# Patient Record
Sex: Male | Born: 1935 | Race: White | Hispanic: No | Smoking: Former smoker
Health system: Southern US, Community
[De-identification: ages and names within clinical notes are randomized; demographics above are authoritative.]

## PROBLEM LIST (undated history)

## (undated) DIAGNOSIS — I499 Cardiac arrhythmia, unspecified: Secondary | ICD-10-CM

## (undated) DIAGNOSIS — K219 Gastro-esophageal reflux disease without esophagitis: Secondary | ICD-10-CM

## (undated) DIAGNOSIS — G4730 Sleep apnea, unspecified: Secondary | ICD-10-CM

## (undated) DIAGNOSIS — Z8719 Personal history of other diseases of the digestive system: Secondary | ICD-10-CM

## (undated) DIAGNOSIS — M1990 Unspecified osteoarthritis, unspecified site: Secondary | ICD-10-CM

## (undated) DIAGNOSIS — E7800 Pure hypercholesterolemia, unspecified: Secondary | ICD-10-CM

## (undated) DIAGNOSIS — H9190 Unspecified hearing loss, unspecified ear: Secondary | ICD-10-CM

## (undated) DIAGNOSIS — Z9289 Personal history of other medical treatment: Secondary | ICD-10-CM

## (undated) DIAGNOSIS — I10 Essential (primary) hypertension: Secondary | ICD-10-CM

## (undated) DIAGNOSIS — Z950 Presence of cardiac pacemaker: Secondary | ICD-10-CM

## (undated) HISTORY — PX: PACEMAKER INSERTION: SHX728

## (undated) HISTORY — PX: INJECTION KNEE: SHX2446

## (undated) HISTORY — PX: SKIN GRAFT: SHX250

## (undated) HISTORY — PX: TONSILLECTOMY AND ADENOIDECTOMY: SUR1326

## (undated) HISTORY — PX: TRANSURETHRAL RESECTION OF PROSTATE: SHX73

## (undated) HISTORY — PX: OTHER SURGICAL HISTORY: SHX169

---

## 2009-02-24 ENCOUNTER — Ambulatory Visit: Payer: Self-pay | Admitting: Gastroenterology

## 2009-02-24 LAB — HM COLONOSCOPY

## 2011-05-16 ENCOUNTER — Ambulatory Visit: Payer: Self-pay | Admitting: Cardiology

## 2011-05-16 LAB — CBC WITH DIFFERENTIAL/PLATELET
Basophil %: 0.4 %
Eosinophil %: 4.7 %
HCT: 42.4 % (ref 40.0–52.0)
Lymphocyte #: 1.8 10*3/uL (ref 1.0–3.6)
MCH: 33.2 pg (ref 26.0–34.0)
MCHC: 34.7 g/dL (ref 32.0–36.0)
Monocyte #: 0.7 x10 3/mm (ref 0.2–1.0)
Neutrophil #: 3.8 10*3/uL (ref 1.4–6.5)
Neutrophil %: 57.8 %
RBC: 4.43 10*6/uL (ref 4.40–5.90)
WBC: 6.5 10*3/uL (ref 3.8–10.6)

## 2011-05-16 LAB — PROTIME-INR: Prothrombin Time: 12.7 secs (ref 11.5–14.7)

## 2011-05-16 LAB — BASIC METABOLIC PANEL
Anion Gap: 9 (ref 7–16)
BUN: 18 mg/dL (ref 7–18)
Calcium, Total: 8.8 mg/dL (ref 8.5–10.1)
Chloride: 107 mmol/L (ref 98–107)
EGFR (African American): >60
EGFR (Non-African Amer.): >60
Glucose: 100 mg/dL — ABNORMAL HIGH (ref 65–99)
Osmolality: 289 (ref 275–301)
Potassium: 4 mmol/L (ref 3.5–5.1)

## 2011-05-19 ENCOUNTER — Ambulatory Visit: Payer: Self-pay | Admitting: Cardiology

## 2012-01-20 ENCOUNTER — Ambulatory Visit: Payer: Self-pay | Admitting: Family Medicine

## 2013-11-11 DIAGNOSIS — I495 Sick sinus syndrome: Secondary | ICD-10-CM | POA: Insufficient documentation

## 2014-02-03 DIAGNOSIS — L820 Inflamed seborrheic keratosis: Secondary | ICD-10-CM | POA: Diagnosis not present

## 2014-02-03 DIAGNOSIS — L814 Other melanin hyperpigmentation: Secondary | ICD-10-CM | POA: Diagnosis not present

## 2014-02-03 DIAGNOSIS — L853 Xerosis cutis: Secondary | ICD-10-CM | POA: Diagnosis not present

## 2014-02-03 DIAGNOSIS — L905 Scar conditions and fibrosis of skin: Secondary | ICD-10-CM | POA: Diagnosis not present

## 2014-02-03 DIAGNOSIS — D1800 Hemangioma unspecified site: Secondary | ICD-10-CM | POA: Diagnosis not present

## 2014-02-03 DIAGNOSIS — L578 Other skin changes due to chronic exposure to nonionizing radiation: Secondary | ICD-10-CM | POA: Diagnosis not present

## 2014-02-03 DIAGNOSIS — D2330 Other benign neoplasm of skin of unspecified part of face: Secondary | ICD-10-CM | POA: Diagnosis not present

## 2014-02-03 DIAGNOSIS — Z1283 Encounter for screening for malignant neoplasm of skin: Secondary | ICD-10-CM | POA: Diagnosis not present

## 2014-02-03 DIAGNOSIS — L821 Other seborrheic keratosis: Secondary | ICD-10-CM | POA: Diagnosis not present

## 2014-02-03 DIAGNOSIS — L570 Actinic keratosis: Secondary | ICD-10-CM | POA: Diagnosis not present

## 2014-02-03 DIAGNOSIS — D485 Neoplasm of uncertain behavior of skin: Secondary | ICD-10-CM | POA: Diagnosis not present

## 2014-02-03 DIAGNOSIS — I788 Other diseases of capillaries: Secondary | ICD-10-CM | POA: Diagnosis not present

## 2014-04-10 DIAGNOSIS — M170 Bilateral primary osteoarthritis of knee: Secondary | ICD-10-CM | POA: Diagnosis not present

## 2014-05-04 NOTE — Op Note (Signed)
PATIENT NAME:  Wesley Small, WIENEKE MR#:  [id number] DATE OF BIRTH:  07/11/1935  DATE OF PROCEDURE:  05/19/2011  PREPROCEDURAL DIAGNOSIS: Type II second degree AV block.  PROCEDURE: Dual chamber pacemaker implantation.  POSTPROCEDURAL DIAGNOSIS: AV sequential pacing.  SURGEON: Isaias Cowman, MD  INDICATION: Patient is a 79 year old gentleman with symptomatic bradycardia, found to have type II second degree AV block at high risk for progression to complete heart block.   DESCRIPTION OF PROCEDURE: The patient was brought to the Operating Room in a fasting state. The left pectoral region was prepped and draped in usual sterile manner. Anesthesia was attained, 1% Xylocaine locally. A 6 cm incision was performed over the left pectoral region. The pacemaker pocket was generated by electrocautery and blunt dissection. Access was obtained to left subclavian vein by fine needle aspiration. Ventricular and atrial leads were positioned, right ventricular apex and right atrial appendage under fluoroscopic guidance. After proper thresholds were attained the leads wee sutured in place. The pacemaker pocket was irrigated with gentamicin solution. The leads were connected to a dual chamber rate responsive pacemaker generator (Medtronic Adapta [id number]) and positioned into the pocket. The pocket was closed with 2-0 and 4-0 Vicryl, respectively. Steri-Strips and pressure dressing were applied.    ____________________________ Isaias Cowman, MD ap:cms D: 05/19/2011 12:17:08 ET T: 05/19/2011 12:33:44 ET JOB#: [id number]  cc: Isaias Cowman, MD, <Dictator> Isaias Cowman MD ELECTRONICALLY SIGNED 06/23/2011 17:39

## 2014-05-05 DIAGNOSIS — F5109 Other insomnia not due to a substance or known physiological condition: Secondary | ICD-10-CM | POA: Diagnosis not present

## 2014-05-05 DIAGNOSIS — I10 Essential (primary) hypertension: Secondary | ICD-10-CM | POA: Diagnosis not present

## 2014-05-05 DIAGNOSIS — E780 Pure hypercholesterolemia: Secondary | ICD-10-CM | POA: Diagnosis not present

## 2014-05-05 DIAGNOSIS — Z23 Encounter for immunization: Secondary | ICD-10-CM | POA: Diagnosis not present

## 2014-05-05 DIAGNOSIS — K219 Gastro-esophageal reflux disease without esophagitis: Secondary | ICD-10-CM | POA: Diagnosis not present

## 2014-05-05 DIAGNOSIS — R5381 Other malaise: Secondary | ICD-10-CM | POA: Diagnosis not present

## 2014-05-06 DIAGNOSIS — N5201 Erectile dysfunction due to arterial insufficiency: Secondary | ICD-10-CM | POA: Diagnosis not present

## 2014-05-06 DIAGNOSIS — D400 Neoplasm of uncertain behavior of prostate: Secondary | ICD-10-CM | POA: Diagnosis not present

## 2014-05-06 DIAGNOSIS — E780 Pure hypercholesterolemia: Secondary | ICD-10-CM | POA: Diagnosis not present

## 2014-05-06 DIAGNOSIS — Z125 Encounter for screening for malignant neoplasm of prostate: Secondary | ICD-10-CM | POA: Diagnosis not present

## 2014-05-06 DIAGNOSIS — E291 Testicular hypofunction: Secondary | ICD-10-CM | POA: Diagnosis not present

## 2014-05-06 DIAGNOSIS — N401 Enlarged prostate with lower urinary tract symptoms: Secondary | ICD-10-CM | POA: Diagnosis not present

## 2014-05-06 DIAGNOSIS — R5383 Other fatigue: Secondary | ICD-10-CM | POA: Diagnosis not present

## 2014-05-06 LAB — CBC AND DIFFERENTIAL
HEMATOCRIT: 46 % (ref 41–53)
HEMOGLOBIN: 15.7 g/dL (ref 13.5–17.5)
Neutrophils Absolute: 4 /uL
PLATELETS: 202 10*3/uL (ref 150–399)
WBC: 7.6 10*3/mL

## 2014-05-06 LAB — BASIC METABOLIC PANEL
BUN: 16 mg/dL (ref 4–21)
Creatinine: 1.1 mg/dL (ref 0.6–1.3)
Glucose: 92 mg/dL
Potassium: 4.8 mmol/L (ref 3.4–5.3)
Sodium: 143 mmol/L (ref 137–147)

## 2014-05-06 LAB — HEPATIC FUNCTION PANEL
ALT: 31 U/L (ref 10–40)
AST: 23 U/L (ref 14–40)
Alkaline Phosphatase: 61 U/L (ref 25–125)

## 2014-05-06 LAB — LIPID PANEL
Cholesterol: 183 mg/dL (ref 0–200)
HDL: 52 mg/dL (ref 35–70)
LDL CALC: 80 mg/dL
LDL/HDL RATIO: 1.5
TRIGLYCERIDES: 257 mg/dL — AB (ref 40–160)

## 2014-05-06 LAB — TSH: TSH: 0.8 u[IU]/mL (ref 0.41–5.90)

## 2014-10-29 DIAGNOSIS — I10 Essential (primary) hypertension: Secondary | ICD-10-CM | POA: Insufficient documentation

## 2014-10-29 DIAGNOSIS — F4321 Adjustment disorder with depressed mood: Secondary | ICD-10-CM | POA: Insufficient documentation

## 2014-10-29 DIAGNOSIS — M1990 Unspecified osteoarthritis, unspecified site: Secondary | ICD-10-CM | POA: Insufficient documentation

## 2014-10-29 DIAGNOSIS — G4700 Insomnia, unspecified: Secondary | ICD-10-CM | POA: Insufficient documentation

## 2014-10-29 DIAGNOSIS — Z950 Presence of cardiac pacemaker: Secondary | ICD-10-CM | POA: Insufficient documentation

## 2014-10-29 DIAGNOSIS — E7800 Pure hypercholesterolemia, unspecified: Secondary | ICD-10-CM | POA: Insufficient documentation

## 2014-11-01 LAB — FECAL OCCULT BLOOD, GUAIAC: FECAL OCCULT BLD: NEGATIVE

## 2014-11-10 ENCOUNTER — Ambulatory Visit (INDEPENDENT_AMBULATORY_CARE_PROVIDER_SITE_OTHER): Payer: Medicare Other | Admitting: Family Medicine

## 2014-11-10 ENCOUNTER — Encounter: Payer: Self-pay | Admitting: Family Medicine

## 2014-11-10 VITALS — BP 148/92 | HR 72 | Temp 98.3°F | Resp 12 | Wt 169.0 lb

## 2014-11-10 DIAGNOSIS — E7800 Pure hypercholesterolemia, unspecified: Secondary | ICD-10-CM | POA: Diagnosis not present

## 2014-11-10 DIAGNOSIS — I10 Essential (primary) hypertension: Secondary | ICD-10-CM

## 2014-11-10 DIAGNOSIS — Z23 Encounter for immunization: Secondary | ICD-10-CM | POA: Diagnosis not present

## 2014-11-10 DIAGNOSIS — G4700 Insomnia, unspecified: Secondary | ICD-10-CM | POA: Diagnosis not present

## 2014-11-10 DIAGNOSIS — M1990 Unspecified osteoarthritis, unspecified site: Secondary | ICD-10-CM | POA: Diagnosis not present

## 2014-11-10 MED ORDER — TRAMADOL HCL 50 MG PO TABS
50.0000 mg | ORAL_TABLET | Freq: Two times a day (BID) | ORAL | Status: DC
Start: 2014-11-10 — End: 2015-06-09

## 2014-11-10 MED ORDER — ZOLPIDEM TARTRATE 10 MG PO TABS: 10.0000 mg | ORAL_TABLET | Freq: Every day | ORAL | Status: DC

## 2014-11-10 MED ORDER — LOSARTAN POTASSIUM 100 MG PO TABS: 100.0000 mg | ORAL_TABLET | Freq: Every day | ORAL | Status: DC

## 2014-11-10 NOTE — Progress Notes (Signed)
Patient ID: Wesley Small, male   DOB: Jan 23, 1935, 79 y.o.   MRN: [id number]    Subjective:  HPI  Hypertension follow up: Patient is here for 6 months follow up. He has been checking his B/P and lately it has been running higher around 155/90s, he has a follow up appointment with cardiologist this week. BP Readings from Last 3 Encounters:  11/10/14 148/92  05/05/14 146/86   Hyperlipidemia follow up:  Lab Results  Component Value Date   CHOL 183 05/06/2014   HDL 52 05/06/2014   LDLCALC 80 05/06/2014   TRIG 257* 05/06/2014   Insomnia follow up: Patient takes Zolpidem every night. He has tried a different medication that made him very dizzy-he is not sure if the name.   Prior to Admission medications   Medication Sig Start Date End Date Taking? Authorizing Provider  ascorbic acid (VITAMIN C) 500 MG tablet  02/15/09  Yes Historical Provider, MD  aspirin 81 MG tablet  12/24/07  Yes Historical Provider, MD  atorvastatin (LIPITOR) 10 MG tablet Take by mouth. 11/12/13  Yes Historical Provider, MD  cholecalciferol (VITAMIN D) 1000 UNITS tablet VITAMIN D, 1000UNIT (Oral Tablet)  1 Every Day for 0 days  Quantity: 0.00;  Refills: 0   Ordered :22-Sep-2009  Althea Charon ;  Started 15-Feb-2009 Active 02/15/09  Yes Historical Provider, MD  Coenzyme Q10 200 MG capsule Take by mouth.   Yes Historical Provider, MD  losartan (COZAAR) 50 MG tablet Take by mouth.   Yes Historical Provider, MD  Magnesium Oxide 400 MG CAPS Take by mouth. 02/15/09  Yes Historical Provider, MD  metoprolol succinate (TOPROL-XL) 25 MG 24 hr tablet Take by mouth. 04/19/11  Yes Historical Provider, MD  Multiple Vitamin tablet Take by mouth.   Yes Historical Provider, MD  Omega-3 Fatty Acids (SALMON OIL-1000) 200 MG CAPS Take by mouth. 04/19/11  Yes Historical Provider, MD  omeprazole (PRILOSEC) 20 MG capsule PRILOSEC, 20MG  (Oral Capsule Delayed Release)  1 q d for 0 days  Quantity: 0.00;  Refills: 0   Ordered  :22-Sep-2009  Althea Charon ;  Started 24-Dec-2007 Active 12/24/07  Yes Historical Provider, MD  Testosterone (ANDROGEL PUMP) 20.25 MG/ACT (1.62%) GEL Place onto the skin.   Yes Historical Provider, MD  traMADol (ULTRAM) 50 MG tablet Take by mouth. 02/27/14  Yes Historical Provider, MD  zolpidem (AMBIEN) 10 MG tablet Take by mouth. 05/05/14  Yes Historical Provider, MD    Patient Active Problem List   Diagnosis Date Noted  . Adjustment disorder with depressed mood 10/29/2014  . Arthritis 10/29/2014  . Insomnia, persistent 10/29/2014  . Hypercholesterolemia 10/29/2014  . BP (high blood pressure) 10/29/2014  . Artificial cardiac pacemaker 10/29/2014  . 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 Acid reflux 05/22/2009  . Essential (primary) hypertension 01/13/2009    No past medical history on file.  Social History   Social History  . Marital Status: Widowed    Spouse Name: N/A  . Number of Children: N/A  . Years of Education: N/A   Occupational History  . Not on file.   Social History Main Topics  . Smoking status: Never Smoker   . Smokeless tobacco: Never Used  . Alcohol Use: Yes  . Drug Use: No  . Sexual Activity: Not on file   Other Topics Concern  . Not on file   Social History Narrative    Allergies  Allergen Reactions  . Penicillins     Review of Systems  Constitutional: Negative.  Respiratory: Negative.   Cardiovascular: Negative.   Gastrointestinal: Negative.   Musculoskeletal: Positive for back pain and joint pain.  Psychiatric/Behavioral: The patient has insomnia (better on medication).     Immunization History  Administered Date(s) Administered  . Pneumococcal Conjugate-13 05/05/2014  . Tdap 10/12/2010   Objective:  BP 148/92 mmHg  Pulse 72  Temp(Src) 98.3 F (36.8 C)  Resp 12  Wt 169 lb (76.658 kg)  Physical Exam  Constitutional: He is oriented to person, place, and time and well-developed, well-nourished, and in no distress.  HENT:   Head: Normocephalic and atraumatic.  Right Ear: External ear normal.  Left Ear: External ear normal.  Nose: Nose normal.  Eyes: Conjunctivae are normal. Pupils are equal, round, and reactive to light.  Neck: Normal range of motion. Neck supple.  Cardiovascular: Normal rate, regular rhythm, normal heart sounds and intact distal pulses.   No murmur heard. Pulmonary/Chest: Effort normal and breath sounds normal. No respiratory distress. He has no wheezes.  Musculoskeletal: Normal range of motion. He exhibits no edema.  Neurological: He is alert and oriented to person, place, and time. Gait normal.  Psychiatric: Mood, memory, affect and judgment normal.    Lab Results  Component Value Date   WBC 7.6 05/06/2014   HGB 15.7 05/06/2014   HCT 46 05/06/2014   PLT 202 05/06/2014   GLUCOSE 100* 05/16/2011   CHOL 183 05/06/2014   TRIG 257* 05/06/2014   HDL 52 05/06/2014   LDLCALC 80 05/06/2014   TSH 0.80 05/06/2014   INR 0.9 05/16/2011    CMP     Component Value Date/Time   NA 143 05/06/2014   NA 144 05/16/2011 1113   K 4.8 05/06/2014   K 4.0 05/16/2011 1113   CL 107 05/16/2011 1113   CO2 28 05/16/2011 1113   GLUCOSE 100* 05/16/2011 1113   BUN 16 05/06/2014   BUN 18 05/16/2011 1113   CREATININE 1.1 05/06/2014   CREATININE 0.88 05/16/2011 1113   CALCIUM 8.8 05/16/2011 1113   AST 23 05/06/2014   ALT 31 05/06/2014   ALKPHOS 61 05/06/2014   GFRNONAA >60 05/16/2011 1113   GFRAA >60 05/16/2011 1113    Assessment and Plan :  1. Essential (primary) hypertension Elevated. Will increase Losartan to 100 mg at this time. May need to increase Metoprolol dose in the future. Re check on the next visit.  2. Insomnia, persistent Stable on medication. Patient says he cannot sleep without the Ambien. He denies any side effects at all.  3. Hypercholesterolemia Stable. Last check April 2016  4. Need for influenza vaccination  - Flu vaccine HIGH DOSE PF (Fluzone High dose)  5.  Arthritis    Patient was seen and examined by Dr. Eulas Post and note was scribed by Theressa Millard, RMA.   Miguel Aschoff MD [facility] 11/10/2014 10:05 AM

## 2014-11-11 DIAGNOSIS — D400 Neoplasm of uncertain behavior of prostate: Secondary | ICD-10-CM | POA: Diagnosis not present

## 2014-11-11 DIAGNOSIS — E291 Testicular hypofunction: Secondary | ICD-10-CM | POA: Diagnosis not present

## 2014-11-11 DIAGNOSIS — Z79899 Other long term (current) drug therapy: Secondary | ICD-10-CM | POA: Diagnosis not present

## 2014-11-11 DIAGNOSIS — N5201 Erectile dysfunction due to arterial insufficiency: Secondary | ICD-10-CM | POA: Diagnosis not present

## 2014-11-12 DIAGNOSIS — H2513 Age-related nuclear cataract, bilateral: Secondary | ICD-10-CM | POA: Diagnosis not present

## 2014-11-13 DIAGNOSIS — E782 Mixed hyperlipidemia: Secondary | ICD-10-CM | POA: Diagnosis not present

## 2014-11-13 DIAGNOSIS — I10 Essential (primary) hypertension: Secondary | ICD-10-CM | POA: Diagnosis not present

## 2014-11-13 DIAGNOSIS — I495 Sick sinus syndrome: Secondary | ICD-10-CM | POA: Diagnosis not present

## 2014-11-21 DIAGNOSIS — E291 Testicular hypofunction: Secondary | ICD-10-CM | POA: Diagnosis not present

## 2015-01-14 ENCOUNTER — Ambulatory Visit (INDEPENDENT_AMBULATORY_CARE_PROVIDER_SITE_OTHER): Payer: Medicare Other | Admitting: Family Medicine

## 2015-01-14 ENCOUNTER — Encounter: Payer: Self-pay | Admitting: Family Medicine

## 2015-01-14 VITALS — BP 148/82 | HR 70 | Temp 98.1°F | Resp 16 | Wt 171.0 lb

## 2015-01-14 DIAGNOSIS — E7800 Pure hypercholesterolemia, unspecified: Secondary | ICD-10-CM

## 2015-01-14 DIAGNOSIS — I10 Essential (primary) hypertension: Secondary | ICD-10-CM | POA: Diagnosis not present

## 2015-01-14 NOTE — Progress Notes (Signed)
Patient ID: Wesley Small, male   DOB: October 06, 1935, 80 y.o.   MRN: [id number]    Subjective:  HPI  Hypertension, follow-up:  BP Readings from Last 3 Encounters:  01/14/15 148/82  11/10/14 148/92  05/05/14 146/86    He was last seen for hypertension 3 months ago.  BP at that visit was 148/92. Management since that visit includes increase losartan to 100mg  daily. Noted in last OV note that we may need to increase Metoprolol today. He reports good compliance with treatment. He is not having side effects.  Outside blood pressures are 150's/80's. He is experiencing none.  Patient denies chest pain, chest pressure/discomfort and dyspnea.     Wt Readings from Last 3 Encounters:  01/14/15 171 lb (77.565 kg)  11/10/14 169 lb (76.658 kg)  05/05/14 166 lb (75.297 kg)    ------------------------------------------------------------------------ He reports that for about the last month he has had a "clicking noise" in his neck. He says normally it is when he turns his head to the left. Other people have also heard it and have noticed it as well. He denies any pain or history of neck surgeries or past injuries.   He also has had sinus symptoms started about 3 weeks ago. He had Headaches, bloody/cloudy discharge and jaw/tooth pain and post nasal drainage and a sore throat in the mornings. He thinks he is getting better from this, but wanted to bring it up because he is still having post nasal drainage.   Prior to Admission medications   Medication Sig Start Date End Date Taking? Authorizing Provider  ascorbic acid (VITAMIN C) 500 MG tablet  02/15/09  Yes Historical Provider, MD  aspirin 81 MG tablet  12/24/07  Yes Historical Provider, MD  atorvastatin (LIPITOR) 10 MG tablet Take by mouth. 11/12/13  Yes Historical Provider, MD  cholecalciferol (VITAMIN D) 1000 UNITS tablet VITAMIN D, 1000UNIT (Oral Tablet)  1 Every Day for 0 days  Quantity: 0.00;  Refills: 0   Ordered :22-Sep-2009  Althea Charon ;  Started 15-Feb-2009 Active 02/15/09  Yes Historical Provider, MD  Coenzyme Q10 200 MG capsule Take by mouth.   Yes Historical Provider, MD  losartan (COZAAR) 100 MG tablet Take 1 tablet (100 mg total) by mouth daily. 11/10/14  Yes Richard Maceo Pro., MD  Magnesium Oxide 400 MG CAPS Take by mouth. 02/15/09  Yes Historical Provider, MD  metoprolol succinate (TOPROL-XL) 25 MG 24 hr tablet Take by mouth. 04/19/11  Yes Historical Provider, MD  Multiple Vitamin tablet Take by mouth.   Yes Historical Provider, MD  Omega-3 Fatty Acids (SALMON OIL-1000) 200 MG CAPS Take by mouth. 04/19/11  Yes Historical Provider, MD  omeprazole (PRILOSEC) 20 MG capsule PRILOSEC, 20MG  (Oral Capsule Delayed Release)  1 q d for 0 days  Quantity: 0.00;  Refills: 0   Ordered :22-Sep-2009  Althea Charon ;  Started 24-Dec-2007 Active 12/24/07  Yes Historical Provider, MD  Testosterone (ANDROGEL PUMP) 20.25 MG/ACT (1.62%) GEL Place onto the skin.   Yes Historical Provider, MD  traMADol (ULTRAM) 50 MG tablet Take 1 tablet (50 mg total) by mouth 2 (two) times daily. 11/10/14  Yes Richard Maceo Pro., MD  zolpidem (AMBIEN) 10 MG tablet Take 1 tablet (10 mg total) by mouth at bedtime. 11/10/14  Yes Richard Maceo Pro., MD    Patient Active Problem List   Diagnosis Date Noted  . Adjustment disorder with depressed mood 10/29/2014  . Arthritis 10/29/2014  . Insomnia, persistent 10/29/2014  .  Hypercholesterolemia 10/29/2014  . BP (high blood pressure) 10/29/2014  . Artificial cardiac pacemaker 10/29/2014  . Sick sinus syndrome ([place]) 11/11/2013  . 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 Acid reflux 05/22/2009  . Essential (primary) hypertension 01/13/2009    History reviewed. No pertinent past medical history.  Social History   Social History  . Marital Status: Widowed    Spouse Name: N/A  . Number of Children: N/A  . Years of Education: N/A   Occupational History  . Not on file.   Social History  Main Topics  . Smoking status: Never Smoker   . Smokeless tobacco: Never Used  . Alcohol Use: Yes  . Drug Use: No  . Sexual Activity: Not on file   Other Topics Concern  . Not on file   Social History Narrative    Allergies  Allergen Reactions  . Penicillins     Review of Systems  Constitutional: Negative.   HENT: Positive for congestion.   Eyes: Negative.   Respiratory: Negative.   Cardiovascular: Negative.   Gastrointestinal: Negative.   Genitourinary: Negative.   Musculoskeletal: Negative.   Skin: Negative.   Neurological: Negative.   Endo/Heme/Allergies: Negative.   Psychiatric/Behavioral: Negative.     Immunization History  Administered Date(s) Administered  . Influenza, High Dose Seasonal PF 11/10/2014  . Pneumococcal Conjugate-13 05/05/2014  . Tdap 10/12/2010   Objective:  BP 148/82 mmHg  Pulse 70  Temp(Src) 98.1 F (36.7 C) (Oral)  Resp 16  Wt 171 lb (77.565 kg)  Physical Exam  Constitutional: He is oriented to person, place, and time and well-developed, well-nourished, and in no distress.  HENT:  Head: Normocephalic and atraumatic.  Right Ear: External ear normal.  Left Ear: External ear normal.  Nose: Nose normal.  Eyes: Conjunctivae are normal.  Neck: Neck supple.  Cardiovascular: Normal rate, regular rhythm and normal heart sounds.   Pulmonary/Chest: Effort normal and breath sounds normal.  Abdominal: Soft.  Neurological: He is alert and oriented to person, place, and time. Gait normal.  Skin: Skin is warm and dry.  Psychiatric: Mood, memory, affect and judgment normal.    Lab Results  Component Value Date   WBC 7.6 05/06/2014   HGB 15.7 05/06/2014   HCT 46 05/06/2014   PLT 202 05/06/2014   GLUCOSE 100* 05/16/2011   CHOL 183 05/06/2014   TRIG 257* 05/06/2014   HDL 52 05/06/2014   LDLCALC 80 05/06/2014   TSH 0.80 05/06/2014   INR 0.9 05/16/2011    CMP     Component Value Date/Time   NA 143 05/06/2014   NA 144 05/16/2011  1113   K 4.8 05/06/2014   K 4.0 05/16/2011 1113   CL 107 05/16/2011 1113   CO2 28 05/16/2011 1113   GLUCOSE 100* 05/16/2011 1113   BUN 16 05/06/2014   BUN 18 05/16/2011 1113   CREATININE 1.1 05/06/2014   CREATININE 0.88 05/16/2011 1113   CALCIUM 8.8 05/16/2011 1113   AST 23 05/06/2014   ALT 31 05/06/2014   ALKPHOS 61 05/06/2014   GFRNONAA >60 05/16/2011 1113   GFRAA >60 05/16/2011 1113    Assessment and Plan :  1. Essential (primary) hypertension Stable. Check labs today - CBC with Differential/Platelet - Comprehensive metabolic panel - TSH  2. Hypercholesterolemia Check labs today - Lipid Panel With LDL/HDL Ratio - TSHw 3.OA/DDD of cervical spine The clicking is in his neck is are almost certainly ligamentous movement in the cervical spine. I do not think this needs evaluation unless  he starts having pain or discomfort. 4. URI/allergic rhinitis Head congestion symptoms are improving on their own.   recommend saline irrigation rinses 5. Hypogonadism  I have done the exam and reviewed the above chart and it is accurate to the best of my knowledge.  Miguel Aschoff MD [facility] 01/14/2015 11:12 AM

## 2015-01-15 DIAGNOSIS — E7800 Pure hypercholesterolemia, unspecified: Secondary | ICD-10-CM | POA: Diagnosis not present

## 2015-01-15 DIAGNOSIS — I10 Essential (primary) hypertension: Secondary | ICD-10-CM | POA: Diagnosis not present

## 2015-01-16 LAB — CBC WITH DIFFERENTIAL/PLATELET
BASOS ABS: 0 10*3/uL (ref 0.0–0.2)
Basos: 0 %
EOS (ABSOLUTE): 0.4 10*3/uL (ref 0.0–0.4)
Eos: 6 %
HEMOGLOBIN: 15.2 g/dL (ref 12.6–17.7)
Hematocrit: 43.8 % (ref 37.5–51.0)
Immature Grans (Abs): 0 10*3/uL (ref 0.0–0.1)
Immature Granulocytes: 0 %
LYMPHS ABS: 2.1 10*3/uL (ref 0.7–3.1)
Lymphs: 29 %
MCH: 33.3 pg — AB (ref 26.6–33.0)
MCHC: 34.7 g/dL (ref 31.5–35.7)
MCV: 96 fL (ref 79–97)
MONOCYTES: 10 %
Monocytes Absolute: 0.7 10*3/uL (ref 0.1–0.9)
NEUTROS ABS: 4 10*3/uL (ref 1.4–7.0)
Neutrophils: 55 %
Platelets: 242 10*3/uL (ref 150–379)
RBC: 4.56 x10E6/uL (ref 4.14–5.80)
RDW: 12.5 % (ref 12.3–15.4)
WBC: 7.3 10*3/uL (ref 3.4–10.8)

## 2015-01-16 LAB — COMPREHENSIVE METABOLIC PANEL
A/G RATIO: 2 (ref 1.1–2.5)
ALBUMIN: 4.1 g/dL (ref 3.5–4.8)
ALK PHOS: 58 IU/L (ref 39–117)
ALT: 18 IU/L (ref 0–44)
AST: 20 IU/L (ref 0–40)
BILIRUBIN TOTAL: 0.9 mg/dL (ref 0.0–1.2)
BUN/Creatinine Ratio: 14 (ref 10–22)
BUN: 13 mg/dL (ref 8–27)
CO2: 24 mmol/L (ref 18–29)
Calcium: 9.2 mg/dL (ref 8.6–10.2)
Chloride: 101 mmol/L (ref 96–106)
Creatinine, Ser: 0.92 mg/dL (ref 0.76–1.27)
GFR, EST AFRICAN AMERICAN: 92 mL/min/{1.73_m2} (ref >59)
GFR, EST NON AFRICAN AMERICAN: 80 mL/min/{1.73_m2} (ref >59)
GLOBULIN, TOTAL: 2.1 g/dL (ref 1.5–4.5)
GLUCOSE: 89 mg/dL (ref 65–99)
Potassium: 4.3 mmol/L (ref 3.5–5.2)
Sodium: 142 mmol/L (ref 134–144)
Total Protein: 6.2 g/dL (ref 6.0–8.5)

## 2015-01-16 LAB — TSH: TSH: 0.861 u[IU]/mL (ref 0.450–4.500)

## 2015-01-16 LAB — LIPID PANEL WITH LDL/HDL RATIO
Cholesterol, Total: 164 mg/dL (ref 100–199)
HDL: 36 mg/dL — AB (ref >39)
LDL CALC: 71 mg/dL (ref 0–99)
LDL/HDL RATIO: 2 ratio (ref 0.0–3.6)
TRIGLYCERIDES: 285 mg/dL — AB (ref 0–149)
VLDL CHOLESTEROL CAL: 57 mg/dL — AB (ref 5–40)

## 2015-02-02 ENCOUNTER — Other Ambulatory Visit: Payer: Self-pay | Admitting: Family Medicine

## 2015-02-04 DIAGNOSIS — L570 Actinic keratosis: Secondary | ICD-10-CM | POA: Diagnosis not present

## 2015-02-04 DIAGNOSIS — L578 Other skin changes due to chronic exposure to nonionizing radiation: Secondary | ICD-10-CM | POA: Diagnosis not present

## 2015-02-04 DIAGNOSIS — Z1283 Encounter for screening for malignant neoplasm of skin: Secondary | ICD-10-CM | POA: Diagnosis not present

## 2015-02-04 DIAGNOSIS — L812 Freckles: Secondary | ICD-10-CM | POA: Diagnosis not present

## 2015-02-04 DIAGNOSIS — L309 Dermatitis, unspecified: Secondary | ICD-10-CM | POA: Diagnosis not present

## 2015-02-04 DIAGNOSIS — D1800 Hemangioma unspecified site: Secondary | ICD-10-CM | POA: Diagnosis not present

## 2015-02-04 DIAGNOSIS — D485 Neoplasm of uncertain behavior of skin: Secondary | ICD-10-CM | POA: Diagnosis not present

## 2015-02-04 DIAGNOSIS — L719 Rosacea, unspecified: Secondary | ICD-10-CM | POA: Diagnosis not present

## 2015-02-04 DIAGNOSIS — L821 Other seborrheic keratosis: Secondary | ICD-10-CM | POA: Diagnosis not present

## 2015-02-04 DIAGNOSIS — L905 Scar conditions and fibrosis of skin: Secondary | ICD-10-CM | POA: Diagnosis not present

## 2015-05-04 ENCOUNTER — Other Ambulatory Visit: Payer: Self-pay | Admitting: Family Medicine

## 2015-05-20 DIAGNOSIS — D400 Neoplasm of uncertain behavior of prostate: Secondary | ICD-10-CM | POA: Diagnosis not present

## 2015-05-20 DIAGNOSIS — N401 Enlarged prostate with lower urinary tract symptoms: Secondary | ICD-10-CM | POA: Diagnosis not present

## 2015-05-20 DIAGNOSIS — E291 Testicular hypofunction: Secondary | ICD-10-CM | POA: Diagnosis not present

## 2015-05-20 DIAGNOSIS — Z79899 Other long term (current) drug therapy: Secondary | ICD-10-CM | POA: Diagnosis not present

## 2015-06-04 DIAGNOSIS — I495 Sick sinus syndrome: Secondary | ICD-10-CM | POA: Diagnosis not present

## 2015-06-09 ENCOUNTER — Other Ambulatory Visit: Payer: Self-pay | Admitting: Family Medicine

## 2015-06-11 ENCOUNTER — Other Ambulatory Visit: Payer: Self-pay | Admitting: Family Medicine

## 2015-06-29 ENCOUNTER — Other Ambulatory Visit: Payer: Self-pay | Admitting: Family Medicine

## 2015-07-21 ENCOUNTER — Encounter: Payer: Medicare Other | Admitting: Family Medicine

## 2015-07-30 ENCOUNTER — Encounter: Payer: Self-pay | Admitting: Family Medicine

## 2015-07-30 ENCOUNTER — Ambulatory Visit (INDEPENDENT_AMBULATORY_CARE_PROVIDER_SITE_OTHER): Payer: Medicare Other | Admitting: Family Medicine

## 2015-07-30 VITALS — BP 158/84 | HR 62 | Temp 98.0°F | Resp 14 | Ht 66.5 in | Wt 169.0 lb

## 2015-07-30 DIAGNOSIS — Z0000 Encounter for general adult medical examination without abnormal findings: Secondary | ICD-10-CM

## 2015-07-30 NOTE — Progress Notes (Signed)
Patient: Wesley Small, Male    DOB: 02/20/1935, 80 y.o.   MRN: [id number] Visit Date: 07/30/2015  Today's Provider: [name], MD   Chief Complaint  Patient presents with  . Medicare Wellness   Subjective:   Wesley Small is a 80 y.o. male who presents today for his Subsequent Annual Wellness Visit. He feels fairly well. He reports exercising no. He reports he is sleeping fairly well. Immunization History  Administered Date(s) Administered  . Influenza, High Dose Seasonal PF 11/10/2014  . Pneumococcal Conjugate-13 05/05/2014  . Tdap 10/12/2010    Last colonoscopy 02/24/09 repeat in 10 years  Review of Systems  Constitutional: Positive for fatigue.  HENT: Positive for tinnitus.   Eyes: Negative.   Respiratory: Negative.   Cardiovascular: Negative.   Gastrointestinal: Negative.   Endocrine: Negative.   Genitourinary: Negative.   Musculoskeletal: Positive for back pain and arthralgias.  Skin: Negative.   Allergic/Immunologic: Negative.   Neurological: Positive for numbness.  Hematological: Negative.   Psychiatric/Behavioral: Negative.     Patient Active Problem List   Diagnosis Date Noted  . Adjustment disorder with depressed mood 10/29/2014  . Arthritis 10/29/2014  . Insomnia, persistent 10/29/2014  . Hypercholesterolemia 10/29/2014  . BP (high blood pressure) 10/29/2014  . Artificial cardiac pacemaker 10/29/2014  . Sick sinus syndrome ([place]) 11/11/2013  . 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 Acid reflux 05/22/2009  . Essential (primary) hypertension 01/13/2009    Social History   Social History  . Marital Status: Widowed    Spouse Name: N/A  . Number of Children: N/A  . Years of Education: N/A   Occupational History  . Not on file.   Social History Main Topics  . Smoking status: Never Smoker   . Smokeless tobacco: Never Used  . Alcohol Use: Yes     Comment: about 4 times a week 1 to 2 drinks at a time  . Drug Use: No  . Sexual  Activity: Not on file   Other Topics Concern  . Not on file   Social History Narrative    Past Surgical History  Procedure Laterality Date  . Transurethral resection of prostate    . Tonsillectomy and adenoidectomy    . Pacemaker insertion      His family history includes Heart disease in his father.    Outpatient Prescriptions Prior to Visit  Medication Sig Dispense Refill  . ascorbic acid (VITAMIN C) 500 MG tablet     . aspirin 81 MG tablet     . atorvastatin (LIPITOR) 10 MG tablet TAKE ONE TABLET BY MOUTH EVERY DAY 90 tablet 2  . cholecalciferol (VITAMIN D) 1000 UNITS tablet VITAMIN D, 1000UNIT (Oral Tablet)  1 Every Day for 0 days  Quantity: 0.00;  Refills: 0   Ordered :22-Sep-2009  Althea Charon ;  Started 15-Feb-2009 Active    . Coenzyme Q10 200 MG capsule Take by mouth.    . losartan (COZAAR) 100 MG tablet Take 1 tablet (100 mg total) by mouth daily. 30 tablet 12  . Magnesium Oxide 400 MG CAPS Take by mouth.    . metoprolol succinate (TOPROL-XL) 25 MG 24 hr tablet Take by mouth.    . Omega-3 Fatty Acids (SALMON OIL-1000) 200 MG CAPS Take by mouth.    Marland Kitchen omeprazole (PRILOSEC) 20 MG capsule PRILOSEC, 20MG  (Oral Capsule Delayed Release)  1 q d for 0 days  Quantity: 0.00;  Refills: 0   Ordered :22-Sep-2009  Althea Charon ;  Started 24-Dec-2007 Active    .  Testosterone (ANDROGEL PUMP) 20.25 MG/ACT (1.62%) GEL Place onto the skin.    Marland Kitchen traMADol (ULTRAM) 50 MG tablet TAKE 1 TABLET BY MOUTH TWICE A DAY 60 tablet 5  . zolpidem (AMBIEN) 10 MG tablet TAKE 1 TABLET BY MOUTH EVERY NIGHT AT BEDTIME 30 tablet 5  . Multiple Vitamin tablet Take by mouth.     No facility-administered medications prior to visit.    Allergies  Allergen Reactions  . Penicillins     Patient Care Team: Jerrol Banana., MD as PCP - General (Family Medicine)  Objective:   Vitals:  Filed Vitals:   07/30/15 1026  BP: 158/84  Pulse: 62  Temp: 98 F (36.7 C)  Resp: 14  Height: 5'  6.5" (1.689 m)  Weight: 169 lb (76.658 kg)    Physical Exam  Constitutional: He is oriented to person, place, and time. He appears well-developed and well-nourished.  HENT:  Head: Normocephalic and atraumatic.  Right Ear: External ear normal.  Left Ear: External ear normal.  Nose: Nose normal.  Bilateral hearing aids in place  Eyes: Conjunctivae are normal.  Neck: Neck supple. No thyromegaly present.  Cardiovascular: Normal rate, regular rhythm, normal heart sounds and intact distal pulses.   Pulmonary/Chest: Effort normal and breath sounds normal.  Abdominal: Soft.  Genitourinary: Rectum normal and prostate normal. Guaiac negative stool.  Neurological: He is alert and oriented to person, place, and time. No cranial nerve deficit. He exhibits normal muscle tone. Coordination normal.  Skin: Skin is warm and dry.  Psychiatric: He has a normal mood and affect. His behavior is normal. Judgment and thought content normal.    Activities of Daily Living In your present state of health, do you have any difficulty performing the following activities: 07/30/2015 11/10/2014  Hearing? Tempie Donning  Vision? N N  Difficulty concentrating or making decisions? N N  Walking or climbing stairs? N N  Dressing or bathing? N N  Doing errands, shopping? N N    Fall Risk Assessment Fall Risk  07/30/2015 11/10/2014  Falls in the past year? No No     Depression Screen PHQ 2/9 Scores 07/30/2015 11/10/2014  PHQ - 2 Score 0 0    Cognitive Testing - 6-CIT    Year: 0 4 points  Month: 0 3 points  Memorize "Pia Mau, [address] [place]"  Time (within 1 hour:) 0 3 points  Count backwards from 20: 0 2 4 points  Name months of year: 0 2 4 points  Repeat Address: 0 2 4 6 8 10  points   Total Score: 0/28  Interpretation : Normal (0-7) Abnormal (8-28)    Assessment & Plan:     Annual Wellness Visit  Reviewed patient's Family Medical History Reviewed and updated list of patient's medical  providers Assessment of cognitive impairment was done Assessed patient's functional ability Established a written schedule for health screening [place] Completed and Reviewed Colonoscopy 2021.  Miguel Aschoff MD [facility] 07/30/2015 10:28 AM  ------------------------------------------------------------------------------------------------------------

## 2015-10-21 DIAGNOSIS — M170 Bilateral primary osteoarthritis of knee: Secondary | ICD-10-CM | POA: Diagnosis not present

## 2015-11-04 ENCOUNTER — Other Ambulatory Visit: Payer: Self-pay | Admitting: Family Medicine

## 2015-11-04 ENCOUNTER — Encounter: Payer: Self-pay | Admitting: Family Medicine

## 2015-11-04 ENCOUNTER — Ambulatory Visit (INDEPENDENT_AMBULATORY_CARE_PROVIDER_SITE_OTHER): Payer: Medicare Other | Admitting: Family Medicine

## 2015-11-04 VITALS — BP 146/80 | HR 78 | Temp 98.4°F | Resp 16 | Wt 170.0 lb

## 2015-11-04 DIAGNOSIS — E7800 Pure hypercholesterolemia, unspecified: Secondary | ICD-10-CM | POA: Diagnosis not present

## 2015-11-04 DIAGNOSIS — Z23 Encounter for immunization: Secondary | ICD-10-CM | POA: Diagnosis not present

## 2015-11-04 DIAGNOSIS — K219 Gastro-esophageal reflux disease without esophagitis: Secondary | ICD-10-CM

## 2015-11-04 DIAGNOSIS — I10 Essential (primary) hypertension: Secondary | ICD-10-CM

## 2015-11-04 DIAGNOSIS — G4700 Insomnia, unspecified: Secondary | ICD-10-CM | POA: Diagnosis not present

## 2015-11-04 MED ORDER — ZOLPIDEM TARTRATE 10 MG PO TABS: 10.0000 mg | ORAL_TABLET | Freq: Every day | ORAL | 5 refills | Status: DC

## 2015-11-04 NOTE — Progress Notes (Signed)
Patient: Wesley Small Male    DOB: [birth year]   80 y.o.   MRN: [id number] Visit Date: 11/04/2015  Today's Provider: [name], MD   Chief Complaint  Patient presents with  . Hypertension    3 month follow up   . Hyperlipidemia  . Gastroesophageal Reflux  . Insomnia   Subjective:    HPI    Hypertension, follow-up:  BP Readings from Last 3 Encounters:  11/04/15 (!) 146/80  07/30/15 (!) 158/84  01/14/15 (!) 148/82    He was last seen for hypertension 3 months ago.  BP at that visit was 158/84. Management since that visit includes no changes. He reports good compliance with treatment. He is not having side effects.  He is exercising. He is adherent to low salt diet.   Outside blood pressures are not checked regularly. Patient denies chest pain, exertional chest pressure/discomfort, palpitations and tachypnea.   Cardiovascular risk factors include dyslipidemia.     Weight trend: stable Wt Readings from Last 3 Encounters:  11/04/15 170 lb (77.1 kg)  07/30/15 169 lb (76.7 kg)  01/14/15 171 lb (77.6 kg)    Current diet: well balanced    Lipid/Cholesterol, Follow-up:   Last seen for this3 months ago.  Management changes since that visit include no changes. . Last Lipid Panel:    Component Value Date/Time   CHOL 164 01/15/2015 0812   TRIG 285 (H) 01/15/2015 0812   HDL 36 (L) 01/15/2015 0812   LDLCALC 71 01/15/2015 0812    Risk factors for vascular disease include hypertension  He reports good compliance with treatment. He is not having side effects.  Current symptoms include none and have been stable. Weight trend: stable Prior visit with dietician: no Current diet: well balanced Current exercise: yard work  [employer] from Last 3 Encounters:  11/04/15 170 lb (77.1 kg)  07/30/15 169 lb (76.7 kg)  01/14/15 171 lb (77.6 kg)     GERD, follow up:  Patient was last seen in the office 3 months ago. Patient is currently taking  omeprazole 20mg . Patient reports that he is not having any breakthrough symptoms.   Insomnia, follow up:  Patient was last seen in the office 3 months ago. No med changes were made. Patient says he cannot sleep without the Ambien.    Allergies  Allergen Reactions  . Penicillins      Current Outpatient Prescriptions:  .  ascorbic acid (VITAMIN C) 500 MG tablet, , Disp: , Rfl:  .  aspirin 81 MG tablet, , Disp: , Rfl:  .  atorvastatin (LIPITOR) 10 MG tablet, TAKE ONE TABLET BY MOUTH EVERY DAY, Disp: 90 tablet, Rfl: 2 .  cholecalciferol (VITAMIN D) 1000 UNITS tablet, VITAMIN D, 1000UNIT (Oral Tablet)  1 Every Day for 0 days  Quantity: 0.00;  Refills: 0   Ordered :22-Sep-2009  Althea Charon ;  Started 15-Feb-2009 Active, Disp: , Rfl:  .  Coenzyme Q10 200 MG capsule, Take by mouth., Disp: , Rfl:  .  losartan (COZAAR) 100 MG tablet, Take 1 tablet (100 mg total) by mouth daily., Disp: 30 tablet, Rfl: 12 .  Magnesium Oxide 400 MG CAPS, Take by mouth., Disp: , Rfl:  .  metoprolol succinate (TOPROL-XL) 25 MG 24 hr tablet, Take by mouth., Disp: , Rfl:  .  Omega-3 Fatty Acids (SALMON OIL-1000) 200 MG CAPS, Take by mouth., Disp: , Rfl:  .  omeprazole (PRILOSEC) 20 MG capsule, PRILOSEC, 20MG  (Oral Capsule  Delayed Release)  1 q d for 0 days  Quantity: 0.00;  Refills: 0   Ordered :22-Sep-2009  Althea Charon ;  Started 24-Dec-2007 Active, Disp: , Rfl:  .  Testosterone (ANDROGEL PUMP) 20.25 MG/ACT (1.62%) GEL, Place onto the skin., Disp: , Rfl:  .  traMADol (ULTRAM) 50 MG tablet, TAKE 1 TABLET BY MOUTH TWICE A DAY, Disp: 60 tablet, Rfl: 5 .  zolpidem (AMBIEN) 10 MG tablet, TAKE 1 TABLET BY MOUTH EVERY NIGHT AT BEDTIME, Disp: 30 tablet, Rfl: 5  Review of Systems  Constitutional: Negative.   Respiratory: Negative.   Cardiovascular: Negative.   Musculoskeletal: Positive for arthralgias.       Right knee pain.   Skin: Negative.   Neurological: Negative.   Psychiatric/Behavioral: Positive for sleep  disturbance.    Social History  Substance Use Topics  . Smoking status: Never Smoker  . Smokeless tobacco: Never Used  . Alcohol use Yes     Comment: about 4 times a week 1 to 2 drinks at a time   Objective:   BP (!) 146/80 (BP Location: Right Arm, Patient Position: Sitting, Cuff Size: Normal)   Pulse 78   Temp 98.4 F (36.9 C)   Resp 16   Wt 170 lb (77.1 kg)   BMI 27.03 kg/m   Physical Exam  Constitutional: He is oriented to person, place, and time. He appears well-developed and well-nourished.  HENT:  Head: Normocephalic and atraumatic.  Eyes: Conjunctivae are normal. No scleral icterus.  Neck: No thyromegaly present.  Cardiovascular: Normal rate, regular rhythm and normal heart sounds.   Pulmonary/Chest: Effort normal and breath sounds normal.  Abdominal: Soft.  Musculoskeletal: Normal range of motion. He exhibits no edema.  Lymphadenopathy:    He has no cervical adenopathy.  Neurological: He is alert and oriented to person, place, and time.  Skin: Skin is warm and dry.  Psychiatric: He has a normal mood and affect. His behavior is normal. Judgment and thought content normal.  Nursing note and vitals reviewed.       Assessment & Plan:     1. Essential (primary) hypertension  - Comprehensive metabolic panel  2. Hypercholesterolemia  - Lipid panel  3. Insomnia, persistent  - CBC with Differential/Platelet - zolpidem (AMBIEN) 10 MG tablet; Take 1 tablet (10 mg total) by mouth at bedtime.  Dispense: 30 tablet; Refill: 5  4. Gastroesophageal reflux disease without esophagitis   5. Need for influenza vaccination  - Flu vaccine HIGH DOSE PF (Fluzone High dose)       I have done the exam and reviewed the above chart and it is accurate to the best of my knowledge.  Richard Cranford Mon, MD  [facility]

## 2015-11-06 DIAGNOSIS — E7800 Pure hypercholesterolemia, unspecified: Secondary | ICD-10-CM | POA: Diagnosis not present

## 2015-11-06 DIAGNOSIS — I10 Essential (primary) hypertension: Secondary | ICD-10-CM | POA: Diagnosis not present

## 2015-11-06 DIAGNOSIS — G4700 Insomnia, unspecified: Secondary | ICD-10-CM | POA: Diagnosis not present

## 2015-11-07 LAB — COMPREHENSIVE METABOLIC PANEL
A/G RATIO: 2.1 (ref 1.2–2.2)
ALT: 22 IU/L (ref 0–44)
AST: 23 IU/L (ref 0–40)
Albumin: 4.2 g/dL (ref 3.5–4.8)
Alkaline Phosphatase: 65 IU/L (ref 39–117)
BUN/Creatinine Ratio: 13 (ref 10–24)
BUN: 12 mg/dL (ref 8–27)
Bilirubin Total: 0.6 mg/dL (ref 0.0–1.2)
CALCIUM: 9.3 mg/dL (ref 8.6–10.2)
CO2: 24 mmol/L (ref 18–29)
CREATININE: 0.95 mg/dL (ref 0.76–1.27)
Chloride: 101 mmol/L (ref 96–106)
GFR, EST AFRICAN AMERICAN: 88 mL/min/{1.73_m2} (ref >59)
GFR, EST NON AFRICAN AMERICAN: 76 mL/min/{1.73_m2} (ref >59)
GLOBULIN, TOTAL: 2 g/dL (ref 1.5–4.5)
Glucose: 94 mg/dL (ref 65–99)
POTASSIUM: 4.7 mmol/L (ref 3.5–5.2)
Sodium: 143 mmol/L (ref 134–144)
TOTAL PROTEIN: 6.2 g/dL (ref 6.0–8.5)

## 2015-11-07 LAB — CBC WITH DIFFERENTIAL/PLATELET
BASOS: 0 %
Basophils Absolute: 0 10*3/uL (ref 0.0–0.2)
EOS (ABSOLUTE): 0.4 10*3/uL (ref 0.0–0.4)
EOS: 5 %
HEMATOCRIT: 45.9 % (ref 37.5–51.0)
Hemoglobin: 15.8 g/dL (ref 12.6–17.7)
IMMATURE GRANS (ABS): 0 10*3/uL (ref 0.0–0.1)
IMMATURE GRANULOCYTES: 0 %
LYMPHS: 26 %
Lymphocytes Absolute: 1.9 10*3/uL (ref 0.7–3.1)
MCH: 33.5 pg — ABNORMAL HIGH (ref 26.6–33.0)
MCHC: 34.4 g/dL (ref 31.5–35.7)
MCV: 97 fL (ref 79–97)
Monocytes Absolute: 0.9 10*3/uL (ref 0.1–0.9)
Monocytes: 12 %
NEUTROS PCT: 57 %
Neutrophils Absolute: 3.9 10*3/uL (ref 1.4–7.0)
PLATELETS: 225 10*3/uL (ref 150–379)
RBC: 4.72 x10E6/uL (ref 4.14–5.80)
RDW: 13.6 % (ref 12.3–15.4)
WBC: 7.1 10*3/uL (ref 3.4–10.8)

## 2015-11-07 LAB — LIPID PANEL
CHOL/HDL RATIO: 3.5 ratio (ref 0.0–5.0)
Cholesterol, Total: 143 mg/dL (ref 100–199)
HDL: 41 mg/dL (ref >39)
LDL CALC: 65 mg/dL (ref 0–99)
TRIGLYCERIDES: 187 mg/dL — AB (ref 0–149)
VLDL CHOLESTEROL CAL: 37 mg/dL (ref 5–40)

## 2015-11-09 ENCOUNTER — Telehealth: Payer: Self-pay

## 2015-11-09 NOTE — Telephone Encounter (Signed)
-----   Message from Jerrol Banana., MD sent at 11/08/2015  6:07 AM EDT ----- Labs stable.

## 2015-11-09 NOTE — Telephone Encounter (Signed)
Advised pt of lab results. Pt verbally acknowledges understanding. Wesley Small, CMA   

## 2015-11-16 DIAGNOSIS — I10 Essential (primary) hypertension: Secondary | ICD-10-CM | POA: Diagnosis not present

## 2015-11-16 DIAGNOSIS — R0789 Other chest pain: Secondary | ICD-10-CM | POA: Diagnosis not present

## 2015-11-16 DIAGNOSIS — I495 Sick sinus syndrome: Secondary | ICD-10-CM | POA: Diagnosis not present

## 2015-11-16 DIAGNOSIS — E782 Mixed hyperlipidemia: Secondary | ICD-10-CM | POA: Diagnosis not present

## 2015-11-23 DIAGNOSIS — E291 Testicular hypofunction: Secondary | ICD-10-CM | POA: Diagnosis not present

## 2015-11-23 DIAGNOSIS — R350 Frequency of micturition: Secondary | ICD-10-CM | POA: Diagnosis not present

## 2015-11-23 DIAGNOSIS — D400 Neoplasm of uncertain behavior of prostate: Secondary | ICD-10-CM | POA: Diagnosis not present

## 2015-11-23 DIAGNOSIS — N401 Enlarged prostate with lower urinary tract symptoms: Secondary | ICD-10-CM | POA: Diagnosis not present

## 2015-11-23 DIAGNOSIS — Z79899 Other long term (current) drug therapy: Secondary | ICD-10-CM | POA: Diagnosis not present

## 2015-11-23 DIAGNOSIS — Z8546 Personal history of malignant neoplasm of prostate: Secondary | ICD-10-CM | POA: Diagnosis not present

## 2015-11-26 DIAGNOSIS — R0789 Other chest pain: Secondary | ICD-10-CM | POA: Diagnosis not present

## 2015-11-30 ENCOUNTER — Other Ambulatory Visit: Payer: Self-pay | Admitting: Family Medicine

## 2015-12-10 DIAGNOSIS — I495 Sick sinus syndrome: Secondary | ICD-10-CM | POA: Diagnosis not present

## 2015-12-29 ENCOUNTER — Other Ambulatory Visit: Payer: Self-pay | Admitting: Family Medicine

## 2016-02-04 DIAGNOSIS — L812 Freckles: Secondary | ICD-10-CM | POA: Diagnosis not present

## 2016-02-04 DIAGNOSIS — L718 Other rosacea: Secondary | ICD-10-CM | POA: Diagnosis not present

## 2016-02-04 DIAGNOSIS — L821 Other seborrheic keratosis: Secondary | ICD-10-CM | POA: Diagnosis not present

## 2016-02-04 DIAGNOSIS — D1800 Hemangioma unspecified site: Secondary | ICD-10-CM | POA: Diagnosis not present

## 2016-02-04 DIAGNOSIS — D229 Melanocytic nevi, unspecified: Secondary | ICD-10-CM | POA: Diagnosis not present

## 2016-02-04 DIAGNOSIS — D485 Neoplasm of uncertain behavior of skin: Secondary | ICD-10-CM | POA: Diagnosis not present

## 2016-02-04 DIAGNOSIS — Z1283 Encounter for screening for malignant neoplasm of skin: Secondary | ICD-10-CM | POA: Diagnosis not present

## 2016-02-04 DIAGNOSIS — L570 Actinic keratosis: Secondary | ICD-10-CM | POA: Diagnosis not present

## 2016-02-04 DIAGNOSIS — L905 Scar conditions and fibrosis of skin: Secondary | ICD-10-CM | POA: Diagnosis not present

## 2016-02-04 DIAGNOSIS — L578 Other skin changes due to chronic exposure to nonionizing radiation: Secondary | ICD-10-CM | POA: Diagnosis not present

## 2016-02-24 DIAGNOSIS — M1711 Unilateral primary osteoarthritis, right knee: Secondary | ICD-10-CM | POA: Diagnosis not present

## 2016-02-24 DIAGNOSIS — M25561 Pain in right knee: Secondary | ICD-10-CM | POA: Diagnosis not present

## 2016-02-24 DIAGNOSIS — G8929 Other chronic pain: Secondary | ICD-10-CM | POA: Diagnosis not present

## 2016-04-13 DIAGNOSIS — H2513 Age-related nuclear cataract, bilateral: Secondary | ICD-10-CM | POA: Diagnosis not present

## 2016-05-10 DIAGNOSIS — I495 Sick sinus syndrome: Secondary | ICD-10-CM | POA: Diagnosis not present

## 2016-05-10 DIAGNOSIS — M5416 Radiculopathy, lumbar region: Secondary | ICD-10-CM | POA: Diagnosis not present

## 2016-05-10 DIAGNOSIS — M170 Bilateral primary osteoarthritis of knee: Secondary | ICD-10-CM | POA: Diagnosis not present

## 2016-05-10 DIAGNOSIS — M5136 Other intervertebral disc degeneration, lumbar region: Secondary | ICD-10-CM | POA: Diagnosis not present

## 2016-05-16 DIAGNOSIS — H903 Sensorineural hearing loss, bilateral: Secondary | ICD-10-CM | POA: Diagnosis not present

## 2016-05-23 ENCOUNTER — Other Ambulatory Visit: Payer: Self-pay | Admitting: Family Medicine

## 2016-05-23 DIAGNOSIS — N401 Enlarged prostate with lower urinary tract symptoms: Secondary | ICD-10-CM | POA: Diagnosis not present

## 2016-05-23 DIAGNOSIS — Z79899 Other long term (current) drug therapy: Secondary | ICD-10-CM | POA: Diagnosis not present

## 2016-05-23 DIAGNOSIS — G4700 Insomnia, unspecified: Secondary | ICD-10-CM

## 2016-05-23 DIAGNOSIS — E291 Testicular hypofunction: Secondary | ICD-10-CM | POA: Diagnosis not present

## 2016-05-23 DIAGNOSIS — Z8546 Personal history of malignant neoplasm of prostate: Secondary | ICD-10-CM | POA: Diagnosis not present

## 2016-05-23 DIAGNOSIS — R350 Frequency of micturition: Secondary | ICD-10-CM | POA: Diagnosis not present

## 2016-05-23 DIAGNOSIS — D400 Neoplasm of uncertain behavior of prostate: Secondary | ICD-10-CM | POA: Diagnosis not present

## 2016-06-28 DIAGNOSIS — M48061 Spinal stenosis, lumbar region without neurogenic claudication: Secondary | ICD-10-CM | POA: Diagnosis not present

## 2016-06-28 DIAGNOSIS — M170 Bilateral primary osteoarthritis of knee: Secondary | ICD-10-CM | POA: Diagnosis not present

## 2016-06-30 ENCOUNTER — Other Ambulatory Visit: Payer: Self-pay | Admitting: Family Medicine

## 2016-07-01 ENCOUNTER — Other Ambulatory Visit: Payer: Self-pay | Admitting: Family Medicine

## 2016-08-02 ENCOUNTER — Ambulatory Visit: Payer: Medicare Other | Admitting: Family Medicine

## 2016-08-02 ENCOUNTER — Ambulatory Visit (INDEPENDENT_AMBULATORY_CARE_PROVIDER_SITE_OTHER): Payer: Medicare Other

## 2016-08-02 VITALS — BP 154/80 | HR 68 | Temp 98.4°F | Ht 67.0 in | Wt 170.2 lb

## 2016-08-02 VITALS — BP 142/86 | HR 68 | Temp 98.4°F | Ht 67.0 in | Wt 170.0 lb

## 2016-08-02 DIAGNOSIS — Z1211 Encounter for screening for malignant neoplasm of colon: Secondary | ICD-10-CM | POA: Diagnosis not present

## 2016-08-02 DIAGNOSIS — Z23 Encounter for immunization: Secondary | ICD-10-CM | POA: Diagnosis not present

## 2016-08-02 DIAGNOSIS — M179 Osteoarthritis of knee, unspecified: Secondary | ICD-10-CM | POA: Insufficient documentation

## 2016-08-02 DIAGNOSIS — K219 Gastro-esophageal reflux disease without esophagitis: Secondary | ICD-10-CM

## 2016-08-02 DIAGNOSIS — G4700 Insomnia, unspecified: Secondary | ICD-10-CM | POA: Diagnosis not present

## 2016-08-02 DIAGNOSIS — K648 Other hemorrhoids: Secondary | ICD-10-CM

## 2016-08-02 DIAGNOSIS — Z0000 Encounter for general adult medical examination without abnormal findings: Secondary | ICD-10-CM

## 2016-08-02 DIAGNOSIS — I10 Essential (primary) hypertension: Secondary | ICD-10-CM

## 2016-08-02 DIAGNOSIS — E7800 Pure hypercholesterolemia, unspecified: Secondary | ICD-10-CM

## 2016-08-02 DIAGNOSIS — M1710 Unilateral primary osteoarthritis, unspecified knee: Secondary | ICD-10-CM | POA: Insufficient documentation

## 2016-08-02 LAB — IFOBT (OCCULT BLOOD): IFOBT: NEGATIVE

## 2016-08-02 MED ORDER — HYDROCORTISONE 2.5 % EX CREA: TOPICAL_CREAM | Freq: Two times a day (BID) | CUTANEOUS | 3 refills | Status: DC

## 2016-08-02 MED ORDER — ZOLPIDEM TARTRATE 10 MG PO TABS: 10.0000 mg | ORAL_TABLET | Freq: Every day | ORAL | 5 refills | Status: DC

## 2016-08-02 NOTE — Progress Notes (Signed)
Patient: Wesley Small, Male    DOB: 06/01/1935, 81 y.o.   MRN: [id number] Visit Date: 08/02/2016  Today's Provider: [name], MD   Chief Complaint  Patient presents with  . Annual Exam   Subjective:  Wesley Small is a 81 y.o. male who presents today for health maintenance and complete physical. He feels well. He reports exercising not at this time. He reports he is sleeping well. Patient saw [facility] for Wellness Visit earlier today. 6CIT Screen 08/02/2016  What Year? 0 points  What month? 0 points  What time? 0 points  Count back from 20 0 points  Months in reverse 0 points  Repeat phrase 0 points  Total Score 0    Fall Risk  08/02/2016 07/30/2015 11/10/2014  Falls in the past year? No No No   Last colonoscopy 02/24/09 hemorrhoids, diverticulosis, otherwise normal, repeat in 10 years-2021. Immunization History  Administered Date(s) Administered  . Influenza, High Dose Seasonal PF 11/10/2014, 11/04/2015  . Pneumococcal Conjugate-13 05/05/2014  . Tdap 10/12/2010    Review of Systems  Constitutional: Negative.   HENT: Positive for hearing loss, postnasal drip and tinnitus.   Eyes: Negative.   Respiratory: Negative.   Cardiovascular: Negative.   Gastrointestinal: Negative.   Endocrine: Negative.   Genitourinary: Negative.   Musculoskeletal: Positive for arthralgias and back pain.  Skin: Negative.   Allergic/Immunologic: Negative.   Neurological: Negative.   Hematological: Negative.   Psychiatric/Behavioral: Negative.     Social History   Social History  . Marital status: Widowed    Spouse name: N/A  . Number of children: N/A  . Years of education: N/A   Occupational History  . Not on file.   Social History Main Topics  . Smoking status: Never Smoker  . Smokeless tobacco: Never Used  . Alcohol use 4.2 - 4.8 oz/week    7 - 8 Shots of liquor per week  . Drug use: No  . Sexual activity: Not on file   Other Topics Concern  . Not on file    Social History Narrative  . No narrative on file    Patient Active Problem List   Diagnosis Date Noted  . Adjustment disorder with depressed mood 10/29/2014  . Arthritis 10/29/2014  . Insomnia, persistent 10/29/2014  . Hypercholesterolemia 10/29/2014  . BP (high blood pressure) 10/29/2014  . Artificial cardiac pacemaker 10/29/2014  . Sick sinus syndrome ([place]) 11/11/2013  . 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 Acid reflux 05/22/2009  . Essential (primary) hypertension 01/13/2009    Past Surgical History:  Procedure Laterality Date  . INJECTION KNEE    . PACEMAKER INSERTION    . TONSILLECTOMY AND ADENOIDECTOMY    . TRANSURETHRAL RESECTION OF PROSTATE      His family history includes Heart disease in his father.     Outpatient Encounter Prescriptions as of 08/02/2016  Medication Sig Note  . ascorbic acid (VITAMIN C) 500 MG tablet Take 500 mg by mouth daily.  10/29/2014: Received from: [facility]  . aspirin 81 MG tablet Take 81 mg by mouth 2 (two) times daily.  10/29/2014: Received from: [facility]  . atorvastatin (LIPITOR) 10 MG tablet TAKE ONE TABLET BY MOUTH EVERY DAY   . Coenzyme Q10 200 MG capsule Take 100 mg by mouth.  10/29/2014: Received from: [facility]  . losartan (COZAAR) 100 MG tablet TAKE 1 TABLET BY MOUTH ONCE A DAY   . Magnesium Oxide 400 MG CAPS Take 400 mg by  mouth daily.  10/29/2014: Received from: [facility]  . metoprolol succinate (TOPROL-XL) 25 MG 24 hr tablet Take by mouth. 10/29/2014: This is prescribed by Dr. Ubaldo Glassing per the patient Received from: [facility]  . omeprazole (PRILOSEC) 20 MG capsule PRILOSEC, 20MG  (Oral Capsule Delayed Release)  1 q d for 0 days  Quantity: 0.00;  Refills: 0   Ordered :22-Sep-2009  Althea Charon ;  Started 24-Dec-2007 Active 10/29/2014: Received from: [facility]  . Testosterone (ANDROGEL PUMP) 20.25 MG/ACT  (1.62%) GEL Place onto the skin daily.  10/29/2014: Received from: [facility]  . traMADol (ULTRAM) 50 MG tablet TAKE 1 TABLET BY MOUTH 2 TIMES DAILY (Patient taking differently: TAKE 1 TABLET BY MOUTH 2 TIMES DAILY as needed)   . zolpidem (AMBIEN) 10 MG tablet TAKE 1 TABLET BY MOUTH AT BEDTIME (Patient taking differently: TAKE 1 TABLET BY MOUTH AT BEDTIME as needed)   . [DISCONTINUED] cholecalciferol (VITAMIN D) 1000 UNITS tablet VITAMIN D, 1000UNIT (Oral Tablet)  1 Every Day for 0 days  Quantity: 0.00;  Refills: 0   Ordered :22-Sep-2009  Althea Charon ;  Started 15-Feb-2009 Active 10/29/2014: Received from: [facility]  . [DISCONTINUED] Omega-3 Fatty Acids (SALMON OIL-1000) 200 MG CAPS Take by mouth. 10/29/2014: Received from: [facility]   No facility-administered encounter medications on file as of 08/02/2016.     Patient Care Team: Jerrol Banana., MD as PCP - General (Family Medicine) Eulogio Bear, MD as Consulting Physician (Ophthalmology) Thornton Park, MD as Referring Physician (Orthopedic Surgery) Dimmig, Marcello Moores, MD as Referring Physician (Orthopedic Surgery) Royston Cowper, MD as Consulting Physician (Urology) Ubaldo Glassing Javier Docker, MD as Consulting Physician (Cardiology) Ralene Bathe, MD as Consulting Physician (Dermatology)      Objective:   Vitals:  Vitals:   08/02/16 1002  BP: (!) 142/86  Pulse: 68  Temp: 98.4 F (36.9 C)  Weight: 170 lb (77.1 kg)  Height: 5\' 7"  (1.702 m)    Physical Exam  Constitutional: He is oriented to person, place, and time. He appears well-developed and well-nourished.  HENT:  Head: Normocephalic and atraumatic.  Right Ear: External ear normal.  Left Ear: External ear normal.  Mouth/Throat: Oropharynx is clear and moist.  Eyes: Pupils are equal, round, and reactive to light. Conjunctivae are normal.  Neck: Neck supple.  Cardiovascular: Normal rate, regular  rhythm, normal heart sounds and intact distal pulses.  Exam reveals no gallop.   No murmur heard. Pulmonary/Chest: Effort normal and breath sounds normal. No respiratory distress. He has no wheezes.  Abdominal: Soft. He exhibits no distension. There is no tenderness.  Genitourinary: Rectum normal, prostate normal and penis normal. Rectal exam shows guaiac negative stool. No penile tenderness.  Musculoskeletal: He exhibits no edema or tenderness.  Neurological: He is alert and oriented to person, place, and time. No cranial nerve deficit.  Skin: Skin is warm and dry. No rash noted.  Psychiatric: He has a normal mood and affect. His behavior is normal. Judgment and thought content normal.   Depression Screen PHQ 2/9 Scores 08/02/2016 07/30/2015 11/10/2014  PHQ - 2 Score 0 0 0    Assessment & Plan:   1. Annual physical exam - TSH  2. Colon cancer screening - IFOBT POC (occult bld, rslt in office);  3. Essential (primary) hypertension - CBC with Differential/Platelet - Comprehensive metabolic panel - TSH  4. Hypercholesterolemia - Comprehensive metabolic panel - Lipid Panel With LDL/HDL Ratio  5. Gastroesophageal reflux  disease without esophagitis  6. Insomnia, persistent Refill given. - zolpidem (AMBIEN) 10 MG tablet; Take 1 tablet (10 mg total) by mouth at bedtime.  Dispense: 30 tablet; Refill: 5 - TSH  7. Internal hemorrhoids Refill for Hydrocortisone cream given. 8.Knee Pain Per Dr Raliegh Ip. HPI, Exam and A&P transcribed by Theressa Millard, RMA under direction and in the presence of Miguel Aschoff, MD.  I have done the exam and reviewed the chart and it is accurate to the best of my knowledge. Development worker, community has been used and  any errors in dictation or transcription are unintentional. Miguel Aschoff M.D. [facility]

## 2016-08-02 NOTE — Patient Instructions (Signed)
[name] , Thank you for taking time to come for your Medicare Wellness Visit. I appreciate your ongoing commitment to your health goals. Please review the following plan we discussed and let me know if I can assist you in the future.   Screening recommendations/referrals: Colonoscopy: completed 02/24/09 Recommended yearly ophthalmology/optometry visit for glaucoma screening and checkup Recommended yearly dental visit for hygiene and checkup  Vaccinations: Influenza vaccine: due 09/2016 Pneumococcal vaccine: completed series Tdap vaccine: completed 10/12/10, due 10/2020 Shingles vaccine: completed per patient   Advanced directives: Please bring a copy of your POA (Power of [place]) and/or Living Will to your next appointment.   Conditions/risks identified: Recommend increasing water intake to 4 glasses of water a day.   Next appointment: None, need to schedule 1 year AWV.  Preventive Care 79 Years and Older, Male Preventive care refers to lifestyle choices and visits with your health care provider that can promote health and wellness. What does preventive care include?  A yearly physical exam. This is also called an annual well check.  Dental exams once or twice a year.  Routine eye exams. Ask your health care provider how often you should have your eyes checked.  Personal lifestyle choices, including:  Daily care of your teeth and gums.  Regular physical activity.  Eating a healthy diet.  Avoiding tobacco and drug use.  Limiting alcohol use.  Practicing safe sex.  Taking low doses of aspirin every day.  Taking vitamin and mineral supplements as recommended by your health care provider. What happens during an annual well check? The services and screenings done by your health care provider during your annual well check will depend on your age, overall health, lifestyle risk factors, and family history of disease. Counseling  Your health care provider may ask you  questions about your:  Alcohol use.  Tobacco use.  Drug use.  Emotional well-being.  Home and relationship well-being.  Sexual activity.  Eating habits.  History of falls.  Memory and ability to understand (cognition).  Work and work Statistician. Screening  You may have the following tests or measurements:  Height, weight, and BMI.  Blood pressure.  Lipid and cholesterol levels. These may be checked every 5 years, or more frequently if you are over 63 years old.  Skin check.  Lung cancer screening. You may have this screening every year starting at age 26 if you have a 30-pack-year history of smoking and currently smoke or have quit within the past 15 years.  Fecal occult blood test (FOBT) of the stool. You may have this test every year starting at age 38.  Flexible sigmoidoscopy or colonoscopy. You may have a sigmoidoscopy every 5 years or a colonoscopy every 10 years starting at age 90.  Prostate cancer screening. Recommendations will vary depending on your family history and other risks.  Hepatitis C blood test.  Hepatitis B blood test.  Sexually transmitted disease (STD) testing.  Diabetes screening. This is done by checking your blood sugar (glucose) after you have not eaten for a while (fasting). You may have this done every 1-3 years.  Abdominal aortic aneurysm (AAA) screening. You may need this if you are a current or former smoker.  Osteoporosis. You may be screened starting at age 57 if you are at high risk. Talk with your health care provider about your test results, treatment options, and if necessary, the need for more tests. Vaccines  Your health care provider may recommend certain vaccines, such as:  Influenza vaccine.  This is recommended every year.  Tetanus, diphtheria, and acellular pertussis (Tdap, Td) vaccine. You may need a Td booster every 10 years.  Zoster vaccine. You may need this after age 33.  Pneumococcal 13-valent conjugate  (PCV13) vaccine. One dose is recommended after age 35.  Pneumococcal polysaccharide (PPSV23) vaccine. One dose is recommended after age 33. Talk to your health care provider about which screenings and vaccines you need and how often you need them. This information is not intended to replace advice given to you by your health care provider. Make sure you discuss any questions you have with your health care provider. Document Released: 01/23/2015 Document Revised: 09/16/2015 Document Reviewed: 10/28/2014 Elsevier Interactive Patient Education  2017 [employer] Prevention in the Home Falls can cause injuries. They can happen to people of all ages. There are many things you can do to make your home safe and to help prevent falls. What can I do on the outside of my home?  Regularly fix the edges of walkways and driveways and fix any cracks.  Remove anything that might make you trip as you walk through a door, such as a raised step or threshold.  Trim any bushes or trees on the path to your home.  Use bright outdoor lighting.  Clear any walking paths of anything that might make someone trip, such as rocks or tools.  Regularly check to see if handrails are loose or broken. Make sure that both sides of any steps have handrails.  Any raised decks and porches should have guardrails on the edges.  Have any leaves, snow, or ice cleared regularly.  Use sand or salt on walking paths during winter.  Clean up any spills in your garage right away. This includes oil or grease spills. What can I do in the bathroom?  Use night lights.  Install grab bars by the toilet and in the tub and shower. Do not use towel bars as grab bars.  Use non-skid mats or decals in the tub or shower.  If you need to sit down in the shower, use a plastic, non-slip stool.  Keep the floor dry. Clean up any water that spills on the floor as soon as it happens.  Remove soap buildup in the tub or shower  regularly.  Attach bath mats securely with double-sided non-slip rug tape.  Do not have throw rugs and other things on the floor that can make you trip. What can I do in the bedroom?  Use night lights.  Make sure that you have a light by your bed that is easy to reach.  Do not use any sheets or blankets that are too big for your bed. They should not hang down onto the floor.  Have a firm chair that has side arms. You can use this for support while you get dressed.  Do not have throw rugs and other things on the floor that can make you trip. What can I do in the kitchen?  Clean up any spills right away.  Avoid walking on wet floors.  Keep items that you use a lot in easy-to-reach places.  If you need to reach something above you, use a strong step stool that has a grab bar.  Keep electrical cords out of the way.  Do not use floor polish or wax that makes floors slippery. If you must use wax, use non-skid floor wax.  Do not have throw rugs and other things on the floor that can make  you trip. What can I do with my stairs?  Do not leave any items on the stairs.  Make sure that there are handrails on both sides of the stairs and use them. Fix handrails that are broken or loose. Make sure that handrails are as long as the stairways.  Check any carpeting to make sure that it is firmly attached to the stairs. Fix any carpet that is loose or worn.  Avoid having throw rugs at the top or bottom of the stairs. If you do have throw rugs, attach them to the floor with carpet tape.  Make sure that you have a light switch at the top of the stairs and the bottom of the stairs. If you do not have them, ask someone to add them for you. What else can I do to help prevent falls?  Wear shoes that:  Do not have high heels.  Have rubber bottoms.  Are comfortable and fit you well.  Are closed at the toe. Do not wear sandals.  If you use a stepladder:  Make sure that it is fully  opened. Do not climb a closed stepladder.  Make sure that both sides of the stepladder are locked into place.  Ask someone to hold it for you, if possible.  Clearly mark and make sure that you can see:  Any grab bars or handrails.  First and last steps.  Where the edge of each step is.  Use tools that help you move around (mobility aids) if they are needed. These include:  Canes.  Walkers.  Scooters.  Crutches.  Turn on the lights when you go into a dark area. Replace any light bulbs as soon as they burn out.  Set up your furniture so you have a clear path. Avoid moving your furniture around.  If any of your floors are uneven, fix them.  If there are any pets around you, be aware of where they are.  Review your medicines with your doctor. Some medicines can make you feel dizzy. This can increase your chance of falling. Ask your doctor what other things that you can do to help prevent falls. This information is not intended to replace advice given to you by your health care provider. Make sure you discuss any questions you have with your health care provider. Document Released: 10/23/2008 Document Revised: 06/04/2015 Document Reviewed: 01/31/2014 Elsevier Interactive Patient Education  2017 [employer].

## 2016-08-02 NOTE — Progress Notes (Signed)
Subjective:   Wesley Small is a 81 y.o. male who presents for Medicare Annual/Subsequent preventive examination.  Review of Systems:  N/A  Cardiac Risk Factors include: advanced age (>72men, >46 women);dyslipidemia;hypertension;male gender;sedentary lifestyle     Objective:    Vitals: BP (!) 154/80 (BP Location: Left Arm)   Pulse 68   Temp 98.4 F (36.9 C) (Oral)   Ht 5\' 7"  (1.702 m)   Wt 170 lb 3.2 oz (77.2 kg)   BMI 26.66 kg/m   Body mass index is 26.66 kg/m.  Tobacco History  Smoking Status  . Never Smoker  Smokeless Tobacco  . Never Used     Counseling given: Not Answered   History reviewed. No pertinent past medical history. Past Surgical History:  Procedure Laterality Date  . INJECTION KNEE    . PACEMAKER INSERTION    . TONSILLECTOMY AND ADENOIDECTOMY    . TRANSURETHRAL RESECTION OF PROSTATE     Family History  Problem Relation Age of Onset  . Heart disease Father    History  Sexual Activity  . Sexual activity: Not on file    Outpatient Encounter Prescriptions as of 08/02/2016  Medication Sig  . acetaminophen (TYLENOL) 500 MG tablet Take 500 mg by mouth at bedtime.  Marland Kitchen ascorbic acid (VITAMIN C) 500 MG tablet Take 500 mg by mouth daily.   Marland Kitchen aspirin 81 MG tablet Take 81 mg by mouth 2 (two) times daily.   Marland Kitchen atorvastatin (LIPITOR) 10 MG tablet TAKE ONE TABLET BY MOUTH EVERY DAY  . Cholecalciferol (VITAMIN D3) 2000 units TABS Take 2,000 Units by mouth daily.  . Coenzyme Q10 200 MG capsule Take 100 mg by mouth.   . losartan (COZAAR) 100 MG tablet TAKE 1 TABLET BY MOUTH ONCE A DAY  . Magnesium Oxide 400 MG CAPS Take 400 mg by mouth daily.   . metoprolol succinate (TOPROL-XL) 25 MG 24 hr tablet Take by mouth.  . Omega-3 Fatty Acids (SALMON OIL-1000 PO) Take 1,000 mg by mouth daily.  Marland Kitchen omeprazole (PRILOSEC) 20 MG capsule PRILOSEC, 20MG  (Oral Capsule Delayed Release)  1 q d for 0 days  Quantity: 0.00;  Refills: 0   Ordered :22-Sep-2009  Althea Charon ;  Started 24-Dec-2007 Active  . Testosterone (ANDROGEL PUMP) 20.25 MG/ACT (1.62%) GEL Place onto the skin daily.   . traMADol (ULTRAM) 50 MG tablet TAKE 1 TABLET BY MOUTH 2 TIMES DAILY (Patient taking differently: TAKE 1 TABLET BY MOUTH 2 TIMES DAILY as needed)  . vitamin B-12 (CYANOCOBALAMIN) 500 MCG tablet Take 500 mcg by mouth daily.  Marland Kitchen zolpidem (AMBIEN) 10 MG tablet TAKE 1 TABLET BY MOUTH AT BEDTIME (Patient taking differently: TAKE 1 TABLET BY MOUTH AT BEDTIME as needed)  . [DISCONTINUED] cholecalciferol (VITAMIN D) 1000 UNITS tablet VITAMIN D, 1000UNIT (Oral Tablet)  1 Every Day for 0 days  Quantity: 0.00;  Refills: 0   Ordered :22-Sep-2009  Althea Charon ;  Started 15-Feb-2009 Active  . [DISCONTINUED] Omega-3 Fatty Acids (SALMON OIL-1000) 200 MG CAPS Take by mouth.   No facility-administered encounter medications on file as of 08/02/2016.     Activities of Daily Living In your present state of health, do you have any difficulty performing the following activities: 08/02/2016  Hearing? Y  Vision? N  Difficulty concentrating or making decisions? N  Walking or climbing stairs? Y  Dressing or bathing? N  Doing errands, shopping? N  Preparing Food and eating ? N  Using the Toilet? N  In the  past six months, have you accidently leaked urine? N  Do you have problems with loss of bowel control? N  Managing your Medications? N  Managing your Finances? N  Housekeeping or managing your Housekeeping? N  Some recent data might be hidden    Patient Care Team: Jerrol Banana., MD as PCP - General (Family Medicine) Eulogio Bear, MD as Consulting Physician (Ophthalmology) Thornton Park, MD as Referring Physician (Orthopedic Surgery) Dimmig, Marcello Moores, MD as Referring Physician (Orthopedic Surgery) Royston Cowper, MD as Consulting Physician (Urology) Teodoro Spray, MD as Consulting Physician (Cardiology) Ralene Bathe, MD as Consulting Physician (Dermatology)    Assessment:     Exercise Activities and Dietary recommendations Current Exercise Habits: The patient does not participate in regular exercise at present (yardwork and house work), Exercise limited by: orthopedic condition(s)  Goals    . Increase water intake          Recommend increasing water intake to 4 glasses of water a day.       Fall Risk Fall Risk  08/02/2016 07/30/2015 11/10/2014  Falls in the past year? No No No   Depression Screen PHQ 2/9 Scores 08/02/2016 07/30/2015 11/10/2014  PHQ - 2 Score 0 0 0    Cognitive Function     6CIT Screen 08/02/2016  What Year? 0 points  What month? 0 points  What time? 0 points  Count back from 20 0 points  Months in reverse 0 points  Repeat phrase 0 points  Total Score 0    Immunization History  Administered Date(s) Administered  . Influenza, High Dose Seasonal PF 11/10/2014, 11/04/2015  . Pneumococcal Conjugate-13 05/05/2014  . Pneumococcal Polysaccharide-23 08/02/2016  . Tdap 10/12/2010   Screening Tests Health Maintenance  Topic Date Due  . INFLUENZA VACCINE  08/10/2016  . TETANUS/TDAP  10/11/2020  . PNA vac Low Risk Adult  Completed      Plan:  I have personally reviewed and addressed the Medicare Annual Wellness questionnaire and have noted the following in the patient's chart:  A. Medical and social history B. Use of alcohol, tobacco or illicit drugs  C. Current medications and supplements D. Functional ability and status E.  Nutritional status F.  Physical activity G. Advance directives H. List of other physicians I.  Hospitalizations, surgeries, and ER visits in previous 12 months J.  [place] such as hearing and vision if needed, cognitive and depression L. Referrals and appointments - none  In addition, I have reviewed and discussed with patient certain preventive protocols, quality metrics, and best practice recommendations. A written personalized care plan for preventive services as well  as general preventive health recommendations were provided to patient.  See attached scanned questionnaire for additional information.   Signed,  Fabio Neighbors, LPN Nurse Health Advisor   MD Recommendations: None.

## 2016-08-03 LAB — COMPREHENSIVE METABOLIC PANEL
ALT: 19 IU/L (ref 0–44)
AST: 21 IU/L (ref 0–40)
Albumin/Globulin Ratio: 2.1 (ref 1.2–2.2)
Albumin: 4.7 g/dL (ref 3.5–4.8)
Alkaline Phosphatase: 71 IU/L (ref 39–117)
BILIRUBIN TOTAL: 1.1 mg/dL (ref 0.0–1.2)
BUN/Creatinine Ratio: 12 (ref 10–24)
BUN: 12 mg/dL (ref 8–27)
CALCIUM: 9.6 mg/dL (ref 8.6–10.2)
CHLORIDE: 103 mmol/L (ref 96–106)
CO2: 26 mmol/L (ref 20–29)
Creatinine, Ser: 1.02 mg/dL (ref 0.76–1.27)
GFR, EST AFRICAN AMERICAN: 80 mL/min/{1.73_m2} (ref >59)
GFR, EST NON AFRICAN AMERICAN: 70 mL/min/{1.73_m2} (ref >59)
GLUCOSE: 94 mg/dL (ref 65–99)
Globulin, Total: 2.2 g/dL (ref 1.5–4.5)
Potassium: 4.6 mmol/L (ref 3.5–5.2)
Sodium: 144 mmol/L (ref 134–144)
TOTAL PROTEIN: 6.9 g/dL (ref 6.0–8.5)

## 2016-08-03 LAB — CBC WITH DIFFERENTIAL/PLATELET
BASOS ABS: 0 10*3/uL (ref 0.0–0.2)
Basos: 1 %
EOS (ABSOLUTE): 0.4 10*3/uL (ref 0.0–0.4)
Eos: 4 %
Hematocrit: 46.7 % (ref 37.5–51.0)
Hemoglobin: 16.3 g/dL (ref 13.0–17.7)
IMMATURE GRANS (ABS): 0 10*3/uL (ref 0.0–0.1)
IMMATURE GRANULOCYTES: 0 %
LYMPHS: 28 %
Lymphocytes Absolute: 2.4 10*3/uL (ref 0.7–3.1)
MCH: 34 pg — ABNORMAL HIGH (ref 26.6–33.0)
MCHC: 34.9 g/dL (ref 31.5–35.7)
MCV: 97 fL (ref 79–97)
Monocytes Absolute: 0.9 10*3/uL (ref 0.1–0.9)
Monocytes: 11 %
NEUTROS PCT: 56 %
Neutrophils Absolute: 4.7 10*3/uL (ref 1.4–7.0)
PLATELETS: 243 10*3/uL (ref 150–379)
RBC: 4.8 x10E6/uL (ref 4.14–5.80)
RDW: 13.4 % (ref 12.3–15.4)
WBC: 8.4 10*3/uL (ref 3.4–10.8)

## 2016-08-03 LAB — LIPID PANEL WITH LDL/HDL RATIO
Cholesterol, Total: 182 mg/dL (ref 100–199)
HDL: 42 mg/dL (ref >39)
LDL Calculated: 62 mg/dL (ref 0–99)
LDl/HDL Ratio: 1.5 ratio (ref 0.0–3.6)
TRIGLYCERIDES: 392 mg/dL — AB (ref 0–149)
VLDL CHOLESTEROL CAL: 78 mg/dL — AB (ref 5–40)

## 2016-08-03 LAB — TSH: TSH: 1.27 u[IU]/mL (ref 0.450–4.500)

## 2016-08-04 DIAGNOSIS — M48061 Spinal stenosis, lumbar region without neurogenic claudication: Secondary | ICD-10-CM | POA: Diagnosis not present

## 2016-08-04 DIAGNOSIS — M170 Bilateral primary osteoarthritis of knee: Secondary | ICD-10-CM | POA: Diagnosis not present

## 2016-09-30 ENCOUNTER — Other Ambulatory Visit: Payer: Self-pay | Admitting: Family Medicine

## 2016-10-10 DIAGNOSIS — H2513 Age-related nuclear cataract, bilateral: Secondary | ICD-10-CM | POA: Diagnosis not present

## 2016-10-14 DIAGNOSIS — Z23 Encounter for immunization: Secondary | ICD-10-CM | POA: Diagnosis not present

## 2016-10-31 ENCOUNTER — Other Ambulatory Visit: Payer: Self-pay | Admitting: Family Medicine

## 2016-11-09 DIAGNOSIS — H2511 Age-related nuclear cataract, right eye: Secondary | ICD-10-CM | POA: Diagnosis not present

## 2016-11-22 ENCOUNTER — Encounter: Payer: Self-pay | Admitting: *Deleted

## 2016-11-24 ENCOUNTER — Ambulatory Visit
Admission: RE | Admit: 2016-11-24 | Discharge: 2016-11-24 | Disposition: A | Discharge status: Home / Self Care | Payer: Medicare Other | Source: Ambulatory Visit | Attending: Ophthalmology | Admitting: Ophthalmology

## 2016-11-24 ENCOUNTER — Ambulatory Visit: Payer: Medicare Other | Admitting: Certified Registered Nurse Anesthetist

## 2016-11-24 ENCOUNTER — Encounter
Admission: RE | Disposition: A | Discharge status: Home / Self Care | Payer: Self-pay | Source: Ambulatory Visit | Attending: Ophthalmology

## 2016-11-24 DIAGNOSIS — K449 Diaphragmatic hernia without obstruction or gangrene: Secondary | ICD-10-CM | POA: Insufficient documentation

## 2016-11-24 DIAGNOSIS — Z87891 Personal history of nicotine dependence: Secondary | ICD-10-CM | POA: Insufficient documentation

## 2016-11-24 DIAGNOSIS — Z950 Presence of cardiac pacemaker: Secondary | ICD-10-CM | POA: Insufficient documentation

## 2016-11-24 DIAGNOSIS — I499 Cardiac arrhythmia, unspecified: Secondary | ICD-10-CM | POA: Insufficient documentation

## 2016-11-24 DIAGNOSIS — I4510 Unspecified right bundle-branch block: Secondary | ICD-10-CM | POA: Diagnosis not present

## 2016-11-24 DIAGNOSIS — G4730 Sleep apnea, unspecified: Secondary | ICD-10-CM | POA: Diagnosis not present

## 2016-11-24 DIAGNOSIS — E7800 Pure hypercholesterolemia, unspecified: Secondary | ICD-10-CM | POA: Insufficient documentation

## 2016-11-24 DIAGNOSIS — I10 Essential (primary) hypertension: Secondary | ICD-10-CM | POA: Diagnosis not present

## 2016-11-24 DIAGNOSIS — R609 Edema, unspecified: Secondary | ICD-10-CM | POA: Insufficient documentation

## 2016-11-24 DIAGNOSIS — H9190 Unspecified hearing loss, unspecified ear: Secondary | ICD-10-CM | POA: Diagnosis not present

## 2016-11-24 DIAGNOSIS — Z880 Allergy status to penicillin: Secondary | ICD-10-CM | POA: Diagnosis not present

## 2016-11-24 DIAGNOSIS — K219 Gastro-esophageal reflux disease without esophagitis: Secondary | ICD-10-CM | POA: Diagnosis not present

## 2016-11-24 DIAGNOSIS — M1990 Unspecified osteoarthritis, unspecified site: Secondary | ICD-10-CM | POA: Insufficient documentation

## 2016-11-24 DIAGNOSIS — H2511 Age-related nuclear cataract, right eye: Secondary | ICD-10-CM | POA: Diagnosis not present

## 2016-11-24 HISTORY — DX: Personal history of other diseases of the digestive system: Z87.19

## 2016-11-24 HISTORY — DX: Essential (primary) hypertension: I10

## 2016-11-24 HISTORY — DX: Unspecified hearing loss, unspecified ear: H91.90

## 2016-11-24 HISTORY — DX: Gastro-esophageal reflux disease without esophagitis: K21.9

## 2016-11-24 HISTORY — DX: Unspecified osteoarthritis, unspecified site: M19.90

## 2016-11-24 HISTORY — DX: Cardiac arrhythmia, unspecified: I49.9

## 2016-11-24 HISTORY — DX: Presence of cardiac pacemaker: Z95.0

## 2016-11-24 HISTORY — PX: CATARACT EXTRACTION W/PHACO: SHX586

## 2016-11-24 SURGERY — PHACOEMULSIFICATION, CATARACT, WITH IOL INSERTION
Anesthesia: Monitor Anesthesia Care | Site: Eye | Laterality: Right | Wound class: Clean

## 2016-11-24 MED ORDER — ARMC OPHTHALMIC DILATING DROPS
1.0000 "application " | OPHTHALMIC | Status: AC
  Administered 2016-11-24 (×3): 1 via OPHTHALMIC

## 2016-11-24 MED ORDER — MOXIFLOXACIN HCL 0.5 % OP SOLN
1.0000 [drp] | OPHTHALMIC | Status: DC | PRN
Start: 2016-11-24 — End: 2016-11-24

## 2016-11-24 MED ORDER — BSS IO SOLN
INTRAOCULAR | Status: DC | PRN
  Administered 2016-11-24: 1 via INTRAOCULAR

## 2016-11-24 MED ORDER — POVIDONE-IODINE 5 % OP SOLN
OPHTHALMIC | Status: AC
  Filled 2016-11-24: qty 30

## 2016-11-24 MED ORDER — MOXIFLOXACIN HCL 0.5 % OP SOLN
OPHTHALMIC | Status: DC | PRN
  Administered 2016-11-24: 0.2 mL via OPHTHALMIC

## 2016-11-24 MED ORDER — POVIDONE-IODINE 5 % OP SOLN
OPHTHALMIC | Status: DC | PRN
  Administered 2016-11-24: 1 via OPHTHALMIC

## 2016-11-24 MED ORDER — SODIUM CHLORIDE 0.9 % IV SOLN
INTRAVENOUS | Status: DC
  Administered 2016-11-24: 08:00:00 via INTRAVENOUS

## 2016-11-24 MED ORDER — LIDOCAINE HCL (PF) 4 % IJ SOLN
INTRAOCULAR | Status: DC | PRN
  Administered 2016-11-24: 4 mL via OPHTHALMIC

## 2016-11-24 MED ORDER — EPINEPHRINE PF 1 MG/ML IJ SOLN
INTRAMUSCULAR | Status: AC
Start: 2016-11-24 — End: 2016-11-24
  Filled 2016-11-24: qty 1

## 2016-11-24 MED ORDER — MOXIFLOXACIN HCL 0.5 % OP SOLN
OPHTHALMIC | Status: AC
  Filled 2016-11-24: qty 3

## 2016-11-24 MED ORDER — FENTANYL CITRATE (PF) 100 MCG/2ML IJ SOLN
INTRAMUSCULAR | Status: DC | PRN
  Administered 2016-11-24: 50 ug via INTRAVENOUS

## 2016-11-24 MED ORDER — ARMC OPHTHALMIC DILATING DROPS
OPHTHALMIC | Status: AC
  Administered 2016-11-24: 1 via OPHTHALMIC
  Filled 2016-11-24: qty 0.4

## 2016-11-24 MED ORDER — SODIUM HYALURONATE 10 MG/ML IO SOLN
INTRAOCULAR | Status: DC | PRN
  Administered 2016-11-24: 0.55 mL via INTRAOCULAR

## 2016-11-24 MED ORDER — LIDOCAINE HCL (PF) 4 % IJ SOLN
INTRAMUSCULAR | Status: AC
  Filled 2016-11-24: qty 5

## 2016-11-24 MED ORDER — SODIUM HYALURONATE 23 MG/ML IO SOLN
INTRAOCULAR | Status: DC | PRN
  Administered 2016-11-24: 0.6 mL via INTRAOCULAR

## 2016-11-24 MED ORDER — FENTANYL CITRATE (PF) 100 MCG/2ML IJ SOLN
INTRAMUSCULAR | Status: AC
  Filled 2016-11-24: qty 2

## 2016-11-24 MED ORDER — SODIUM HYALURONATE 23 MG/ML IO SOLN
INTRAOCULAR | Status: AC
  Filled 2016-11-24: qty 0.6

## 2016-11-24 SURGICAL SUPPLY — 16 items
DISSECTOR HYDRO NUCLEUS 50X22 (MISCELLANEOUS) ×2 IMPLANT
GLOVE BIO SURGEON STRL SZ8 (GLOVE) ×2 IMPLANT
GLOVE BIOGEL M 6.5 STRL (GLOVE) ×2 IMPLANT
GLOVE SURG LX 7.5 STRW (GLOVE) ×1
GLOVE SURG LX STRL 7.5 STRW (GLOVE) ×1 IMPLANT
GOWN STRL REUS W/ TWL LRG LVL3 (GOWN DISPOSABLE) ×2 IMPLANT
GOWN STRL REUS W/TWL LRG LVL3 (GOWN DISPOSABLE) ×2
LABEL CATARACT MEDS ST (LABEL) ×2 IMPLANT
LENS IOL TECNIS ITEC 20.0 (Intraocular Lens) ×2 IMPLANT
PACK CATARACT (MISCELLANEOUS) ×2 IMPLANT
PACK CATARACT KING (MISCELLANEOUS) ×2 IMPLANT
PACK EYE AFTER SURG (MISCELLANEOUS) ×2 IMPLANT
SOL BSS BAG (MISCELLANEOUS) ×2
SOLUTION BSS BAG (MISCELLANEOUS) ×1 IMPLANT
WATER STERILE IRR 250ML POUR (IV SOLUTION) ×2 IMPLANT
WIPE NON LINTING 3.25X3.25 (MISCELLANEOUS) ×2 IMPLANT

## 2016-11-24 NOTE — Anesthesia Procedure Notes (Signed)
Procedure Name: MAC Performed by: Mylin Gignac, CRNA Pre-anesthesia Checklist: Patient identified, Emergency Drugs available, Suction available, Patient being monitored and Timeout performed Oxygen Delivery Method: Nasal cannula       

## 2016-11-24 NOTE — Discharge Instructions (Signed)
Eye Surgery Discharge Instructions  Expect mild scratchy sensation or mild soreness. DO NOT RUB YOUR EYE!  The day of surgery:  Minimal physical activity, but bed rest is not required  No reading, computer work, or close hand work  No bending, lifting, or straining.  May watch TV  For 24 hours:  No driving, legal decisions, or alcoholic beverages  Safety precautions  Eat anything you prefer: It is better to start with liquids, then soup then solid foods.  _____ Eye patch should be worn until postoperative exam tomorrow.  ____ Solar shield eyeglasses should be worn for comfort in the sunlight/patch while sleeping  Resume all regular medications including aspirin or Coumadin if these were discontinued prior to surgery. You may shower, bathe, shave, or wash your hair. Tylenol may be taken for mild discomfort.  Call your doctor if you experience significant pain, nausea, or vomiting, fever > 101 or other signs of infection. [phone] or [phone] Specific instructions:  Follow-up Information    Eulogio Bear, MD Follow up.   Specialty:  Ophthalmology Why:  November 16 at 9:45am in [facility] information: [address] [place] [address] [phone]

## 2016-11-24 NOTE — Anesthesia Postprocedure Evaluation (Signed)
Anesthesia Post Note  Patient: Wesley Small  Procedure(s) Performed: CATARACT EXTRACTION PHACO AND INTRAOCULAR LENS PLACEMENT (IOC) (Right Eye)  Patient location during evaluation: PACU Anesthesia Type: MAC Level of consciousness: awake and alert and oriented Pain management: pain level controlled Vital Signs Assessment: post-procedure vital signs reviewed and stable Respiratory status: spontaneous breathing, nonlabored ventilation and respiratory function stable Cardiovascular status: blood pressure returned to baseline and stable Postop Assessment: no signs of nausea or vomiting Anesthetic complications: no     Last Vitals:  Vitals:   11/24/16 0927 11/24/16 0931  BP: (!) 178/81 (!) 169/75  Pulse: 60   Resp: 16   Temp: 36.7 C   SpO2: 97%     Last Pain:  Vitals:   11/24/16 0731  TempSrc: Oral                 Rickelle Sylvestre

## 2016-11-24 NOTE — Transfer of Care (Signed)
Immediate Anesthesia Transfer of Care Note  Patient: Wesley Small  Procedure(s) Performed: CATARACT EXTRACTION PHACO AND INTRAOCULAR LENS PLACEMENT (IOC) (Right Eye)  Patient Location: PACU  Anesthesia Type:MAC  Level of Consciousness: awake, alert  and oriented  Airway & Oxygen Therapy: Patient Spontanous Breathing  Post-op Assessment: Report given to RN and Post -op Vital signs reviewed and stable  Post vital signs: Reviewed and stable  Last Vitals:  Vitals:   11/24/16 0731  BP: (!) 166/93  Pulse: 63  Resp: 18  Temp: 36.6 C  SpO2: 100%    Last Pain:  Vitals:   11/24/16 0731  TempSrc: Oral         Complications: No apparent anesthesia complications

## 2016-11-24 NOTE — Anesthesia Post-op Follow-up Note (Signed)
Anesthesia QCDR form completed.        

## 2016-11-24 NOTE — Anesthesia Preprocedure Evaluation (Signed)
Anesthesia Evaluation  Patient identified by MRN, date of birth, ID band Patient awake    Reviewed: Allergy & Precautions, NPO status , Patient's Chart, lab work & pertinent test results  History of Anesthesia Complications Negative for: history of anesthetic complications  Airway Mallampati: II  TM Distance: >3 FB Neck ROM: Full    Dental no notable dental hx.    Pulmonary neg sleep apnea, neg COPD, former smoker,    breath sounds clear to auscultation- rhonchi (-) wheezing      Cardiovascular hypertension, Pt. on medications (-) CAD, (-) Past MI and (-) Cardiac Stents + pacemaker (SSS)  Rhythm:Regular Rate:Normal - Systolic murmurs and - Diastolic murmurs    Neuro/Psych negative neurological ROS  negative psych ROS   GI/Hepatic Neg liver ROS, hiatal hernia, GERD  ,  Endo/Other  negative endocrine ROSneg diabetes  Renal/GU negative Renal ROS     Musculoskeletal  (+) Arthritis ,   Abdominal (+) - obese,   Peds  Hematology negative hematology ROS (+)   Anesthesia Other Findings Past Medical History: No date: Arthritis No date: Dysrhythmia     Comment:  RBBB No date: GERD (gastroesophageal reflux disease) No date: History of hiatal hernia No date: HOH (hard of hearing) No date: Hypertension No date: Presence of permanent cardiac pacemaker   Reproductive/Obstetrics                             Anesthesia Physical Anesthesia Plan  ASA: III  Anesthesia Plan: MAC   Post-op Pain Management:    Induction: Intravenous  PONV Risk Score and Plan: 1 and Midazolam  Airway Management Planned: Natural Airway  Additional Equipment:   Intra-op Plan:   Post-operative Plan:   Informed Consent: I have reviewed the patients History and Physical, chart, labs and discussed the procedure including the risks, benefits and alternatives for the proposed anesthesia with the patient or authorized  representative who has indicated his/her understanding and acceptance.     Plan Discussed with: CRNA and Anesthesiologist  Anesthesia Plan Comments:         Anesthesia Quick Evaluation

## 2016-11-24 NOTE — H&P (Signed)
The History and Physical notes are on paper, have been signed, and are to be scanned.   I have examined the patient and there are no changes to the H&P.   Benay Pillow 11/24/2016 8:50 AM

## 2016-11-24 NOTE — Op Note (Signed)
OPERATIVE NOTE  Wesley Small [id number] 11/24/2016   PREOPERATIVE DIAGNOSIS:  Nuclear sclerotic cataract right eye.  H25.11   POSTOPERATIVE DIAGNOSIS:    Nuclear sclerotic cataract right eye.     PROCEDURE:  Phacoemusification with posterior chamber intraocular lens placement of the right eye   LENS:   Implant Name Type Inv. Item Serial No. Manufacturer Lot No. LRB No. Used  LENS IOL DIOP 20.0 - [id number] Intraocular Lens LENS IOL DIOP 20.0 [id number] AMO  Right 1       PCB0 +20.0   ULTRASOUND TIME: 0 minutes 31 seconds.  CDE 3.39   SURGEON:  Benay Pillow, MD, MPH  ANESTHESIOLOGIST: Anesthesiologist: Emmie Niemann, MD CRNA: Demetrius Charity, CRNA   ANESTHESIA:  Topical with tetracaine drops augmented with 1% preservative-free intracameral lidocaine.  ESTIMATED BLOOD LOSS: less than 1 mL.   COMPLICATIONS:  None.   DESCRIPTION OF PROCEDURE:  The patient was identified in the holding room and transported to the operating room and placed in the supine position under the operating microscope.  The right eye was identified as the operative eye and it was prepped and draped in the usual sterile ophthalmic fashion.   A 1.0 millimeter clear-corneal paracentesis was made at the 10:30 position. 0.5 ml of preservative-free 1% lidocaine with epinephrine was injected into the anterior chamber.  The anterior chamber was filled with Healon 5 viscoelastic.  A 2.4 millimeter keratome was used to make a near-clear corneal incision at the 8:00 position.  A curvilinear capsulorrhexis was made with a cystotome and capsulorrhexis forceps.  Balanced salt solution was used to hydrodissect and hydrodelineate the nucleus.   Phacoemulsification was then used in stop and chop fashion to remove the lens nucleus and epinucleus.  The remaining cortex was then removed using the irrigation and aspiration handpiece. Healon was then placed into the capsular bag to distend it for lens placement.  A lens  was then injected into the capsular bag.  The remaining viscoelastic was aspirated.   Wounds were hydrated with balanced salt solution.  The anterior chamber was inflated to a physiologic pressure with balanced salt solution.   Intracameral vigamox 0.1 mL undiluted was injected into the eye and a drop placed onto the ocular surface.  No wound leaks were noted.  The patient was taken to the recovery room in stable condition without complications of anesthesia or surgery  Benay Pillow 11/24/2016, 9:24 AM

## 2016-11-25 ENCOUNTER — Encounter: Payer: Self-pay | Admitting: Ophthalmology

## 2016-12-08 DIAGNOSIS — E782 Mixed hyperlipidemia: Secondary | ICD-10-CM | POA: Diagnosis not present

## 2016-12-08 DIAGNOSIS — I495 Sick sinus syndrome: Secondary | ICD-10-CM | POA: Diagnosis not present

## 2016-12-08 DIAGNOSIS — I10 Essential (primary) hypertension: Secondary | ICD-10-CM | POA: Diagnosis not present

## 2016-12-12 DIAGNOSIS — Z8546 Personal history of malignant neoplasm of prostate: Secondary | ICD-10-CM | POA: Diagnosis not present

## 2016-12-12 DIAGNOSIS — R350 Frequency of micturition: Secondary | ICD-10-CM | POA: Diagnosis not present

## 2016-12-12 DIAGNOSIS — Z79899 Other long term (current) drug therapy: Secondary | ICD-10-CM | POA: Diagnosis not present

## 2016-12-12 DIAGNOSIS — N5201 Erectile dysfunction due to arterial insufficiency: Secondary | ICD-10-CM | POA: Diagnosis not present

## 2016-12-12 DIAGNOSIS — E291 Testicular hypofunction: Secondary | ICD-10-CM | POA: Diagnosis not present

## 2016-12-12 DIAGNOSIS — N401 Enlarged prostate with lower urinary tract symptoms: Secondary | ICD-10-CM | POA: Diagnosis not present

## 2016-12-21 DIAGNOSIS — H2512 Age-related nuclear cataract, left eye: Secondary | ICD-10-CM | POA: Diagnosis not present

## 2016-12-29 ENCOUNTER — Ambulatory Visit
Admission: RE | Admit: 2016-12-29 | Discharge: 2016-12-29 | Disposition: A | Discharge status: Home / Self Care | Payer: Medicare Other | Source: Ambulatory Visit | Attending: Ophthalmology | Admitting: Ophthalmology

## 2016-12-29 ENCOUNTER — Encounter
Admission: RE | Disposition: A | Discharge status: Home / Self Care | Payer: Self-pay | Source: Ambulatory Visit | Attending: Ophthalmology

## 2016-12-29 ENCOUNTER — Ambulatory Visit: Payer: Medicare Other | Admitting: Anesthesiology

## 2016-12-29 ENCOUNTER — Encounter: Payer: Self-pay | Admitting: Anesthesiology

## 2016-12-29 DIAGNOSIS — Z7982 Long term (current) use of aspirin: Secondary | ICD-10-CM | POA: Diagnosis not present

## 2016-12-29 DIAGNOSIS — Z8719 Personal history of other diseases of the digestive system: Secondary | ICD-10-CM | POA: Insufficient documentation

## 2016-12-29 DIAGNOSIS — I4510 Unspecified right bundle-branch block: Secondary | ICD-10-CM | POA: Diagnosis not present

## 2016-12-29 DIAGNOSIS — Z87891 Personal history of nicotine dependence: Secondary | ICD-10-CM | POA: Insufficient documentation

## 2016-12-29 DIAGNOSIS — Z880 Allergy status to penicillin: Secondary | ICD-10-CM | POA: Insufficient documentation

## 2016-12-29 DIAGNOSIS — H2512 Age-related nuclear cataract, left eye: Secondary | ICD-10-CM | POA: Insufficient documentation

## 2016-12-29 DIAGNOSIS — E7800 Pure hypercholesterolemia, unspecified: Secondary | ICD-10-CM | POA: Diagnosis not present

## 2016-12-29 DIAGNOSIS — Z79899 Other long term (current) drug therapy: Secondary | ICD-10-CM | POA: Diagnosis not present

## 2016-12-29 DIAGNOSIS — M1990 Unspecified osteoarthritis, unspecified site: Secondary | ICD-10-CM | POA: Diagnosis not present

## 2016-12-29 DIAGNOSIS — Z950 Presence of cardiac pacemaker: Secondary | ICD-10-CM | POA: Insufficient documentation

## 2016-12-29 DIAGNOSIS — I10 Essential (primary) hypertension: Secondary | ICD-10-CM | POA: Diagnosis not present

## 2016-12-29 DIAGNOSIS — K219 Gastro-esophageal reflux disease without esophagitis: Secondary | ICD-10-CM | POA: Diagnosis not present

## 2016-12-29 HISTORY — DX: Pure hypercholesterolemia, unspecified: E78.00

## 2016-12-29 HISTORY — DX: Sleep apnea, unspecified: G47.30

## 2016-12-29 HISTORY — DX: Personal history of other diseases of the digestive system: Z87.19

## 2016-12-29 HISTORY — DX: Personal history of other medical treatment: Z92.89

## 2016-12-29 HISTORY — PX: CATARACT EXTRACTION W/PHACO: SHX586

## 2016-12-29 SURGERY — PHACOEMULSIFICATION, CATARACT, WITH IOL INSERTION
Anesthesia: Monitor Anesthesia Care | Site: Eye | Laterality: Left | Wound class: Clean

## 2016-12-29 MED ORDER — ARMC OPHTHALMIC DILATING DROPS
OPHTHALMIC | Status: AC
  Administered 2016-12-29: 1 via OPHTHALMIC
  Filled 2016-12-29: qty 0.4

## 2016-12-29 MED ORDER — LIDOCAINE HCL (PF) 4 % IJ SOLN
INTRAOCULAR | Status: DC | PRN
  Administered 2016-12-29: 4 mL via OPHTHALMIC

## 2016-12-29 MED ORDER — MOXIFLOXACIN HCL 0.5 % OP SOLN
1.0000 [drp] | OPHTHALMIC | Status: AC | PRN
  Administered 2016-12-29: 1 [drp] via OPHTHALMIC
  Filled 2016-12-29: qty 3

## 2016-12-29 MED ORDER — SODIUM HYALURONATE 23 MG/ML IO SOLN
INTRAOCULAR | Status: DC | PRN
  Administered 2016-12-29: 0.6 mL via INTRAOCULAR

## 2016-12-29 MED ORDER — ARMC OPHTHALMIC DILATING DROPS
1.0000 "application " | OPHTHALMIC | Status: AC | PRN
  Administered 2016-12-29 (×3): 1 via OPHTHALMIC

## 2016-12-29 MED ORDER — FENTANYL CITRATE (PF) 100 MCG/2ML IJ SOLN
INTRAMUSCULAR | Status: AC
  Filled 2016-12-29: qty 2

## 2016-12-29 MED ORDER — CARBACHOL 0.01 % IO SOLN
INTRAOCULAR | Status: DC | PRN
  Administered 2016-12-29: 0.5 mL via INTRAOCULAR

## 2016-12-29 MED ORDER — MOXIFLOXACIN HCL 0.5 % OP SOLN
OPHTHALMIC | Status: DC | PRN
  Administered 2016-12-29: 0.2 mL via OPHTHALMIC

## 2016-12-29 MED ORDER — EPINEPHRINE PF 1 MG/ML IJ SOLN
INTRAOCULAR | Status: DC | PRN
  Administered 2016-12-29: 10:00:00 via OPHTHALMIC

## 2016-12-29 MED ORDER — POVIDONE-IODINE 5 % OP SOLN
OPHTHALMIC | Status: DC | PRN
  Administered 2016-12-29: 1 via OPHTHALMIC

## 2016-12-29 MED ORDER — SODIUM HYALURONATE 10 MG/ML IO SOLN
INTRAOCULAR | Status: DC | PRN
Start: 2016-12-29 — End: 2016-12-29
  Administered 2016-12-29: 0.55 mL via INTRAOCULAR

## 2016-12-29 MED ORDER — SODIUM CHLORIDE 0.9 % IV SOLN
INTRAVENOUS | Status: DC
  Administered 2016-12-29: 09:00:00 via INTRAVENOUS

## 2016-12-29 MED ORDER — MIDAZOLAM HCL 2 MG/2ML IJ SOLN
INTRAMUSCULAR | Status: DC | PRN
  Administered 2016-12-29 (×2): 1 mg via INTRAVENOUS

## 2016-12-29 MED ORDER — FENTANYL CITRATE (PF) 100 MCG/2ML IJ SOLN
INTRAMUSCULAR | Status: DC | PRN
  Administered 2016-12-29: 50 ug via INTRAVENOUS
  Administered 2016-12-29 (×2): 25 ug via INTRAVENOUS

## 2016-12-29 MED ORDER — MOXIFLOXACIN HCL 0.5 % OP SOLN
OPHTHALMIC | Status: AC
  Filled 2016-12-29: qty 3

## 2016-12-29 MED ORDER — SODIUM HYALURONATE 23 MG/ML IO SOLN
INTRAOCULAR | Status: AC
  Filled 2016-12-29: qty 0.6

## 2016-12-29 MED ORDER — MIDAZOLAM HCL 2 MG/2ML IJ SOLN
INTRAMUSCULAR | Status: AC
  Filled 2016-12-29: qty 2

## 2016-12-29 SURGICAL SUPPLY — 16 items
DISSECTOR HYDRO NUCLEUS 50X22 (MISCELLANEOUS) ×3 IMPLANT
GLOVE BIO SURGEON STRL SZ8 (GLOVE) ×3 IMPLANT
GLOVE BIOGEL M 6.5 STRL (GLOVE) ×3 IMPLANT
GLOVE SURG LX 7.5 STRW (GLOVE) ×2
GLOVE SURG LX STRL 7.5 STRW (GLOVE) ×1 IMPLANT
GOWN STRL REUS W/ TWL LRG LVL3 (GOWN DISPOSABLE) ×2 IMPLANT
GOWN STRL REUS W/TWL LRG LVL3 (GOWN DISPOSABLE) ×4
LABEL CATARACT MEDS ST (LABEL) ×3 IMPLANT
LENS IOL TECNIS ITEC 19.5 (Intraocular Lens) ×3 IMPLANT
PACK CATARACT (MISCELLANEOUS) ×3 IMPLANT
PACK CATARACT KING (MISCELLANEOUS) ×3 IMPLANT
PACK EYE AFTER SURG (MISCELLANEOUS) ×3 IMPLANT
SOL BSS BAG (MISCELLANEOUS) ×3
SOLUTION BSS BAG (MISCELLANEOUS) ×1 IMPLANT
WATER STERILE IRR 250ML POUR (IV SOLUTION) ×3 IMPLANT
WIPE NON LINTING 3.25X3.25 (MISCELLANEOUS) ×3 IMPLANT

## 2016-12-29 NOTE — Anesthesia Post-op Follow-up Note (Signed)
Anesthesia QCDR form completed.        

## 2016-12-29 NOTE — Transfer of Care (Signed)
Immediate Anesthesia Transfer of Care Note  Patient: Wesley Small  Procedure(s) Performed: CATARACT EXTRACTION PHACO AND INTRAOCULAR LENS PLACEMENT (IOC) (Left Eye)  Patient Location: PACU  Anesthesia Type:General  Level of Consciousness: awake, alert  and oriented  Airway & Oxygen Therapy: Patient Spontanous Breathing and Patient connected to nasal cannula oxygen  Post-op Assessment: Report given to RN and Post -op Vital signs reviewed and stable  Post vital signs: Reviewed and stable  Last Vitals:  Vitals:   12/29/16 1032 12/29/16 1046  BP: (!) 155/76 (!) 148/87  Pulse: 62 60  Resp: 16 16  Temp:    SpO2: 95% 97%    Last Pain:  Vitals:   12/29/16 0851  TempSrc: Oral         Complications: No apparent anesthesia complications

## 2016-12-29 NOTE — Op Note (Signed)
OPERATIVE NOTE  Wesley Small [id number] 12/29/2016   PREOPERATIVE DIAGNOSIS:  Nuclear sclerotic cataract left eye.  H25.12   POSTOPERATIVE DIAGNOSIS:    Nuclear sclerotic cataract left eye.     PROCEDURE:  Phacoemusification with posterior chamber intraocular lens placement of the left eye   LENS:   Implant Name Type Inv. Item Serial No. Manufacturer Lot No. LRB No. Used  LENS IOL DIOP 19.5 - [id number] Intraocular Lens LENS IOL DIOP 19.5 [id number] AMO  Left 1       PCB00 +19.5   ULTRASOUND TIME: 0 minutes 33 seconds.  CDE 2.86   SURGEON:  Benay Pillow, MD, MPH   ANESTHESIA:  Topical with tetracaine drops augmented with 1% preservative-free intracameral lidocaine.  ESTIMATED BLOOD LOSS: <1 mL   COMPLICATIONS:  None.   DESCRIPTION OF PROCEDURE:  The patient was identified in the holding room and transported to the operating room and placed in the supine position under the operating microscope.  The left eye was identified as the operative eye and it was prepped and draped in the usual sterile ophthalmic fashion.   A 1.0 millimeter clear-corneal paracentesis was made at the 5:00 position. 0.5 ml of preservative-free 1% lidocaine with epinephrine was injected into the anterior chamber.  The anterior chamber was filled with Healon 5 viscoelastic.  A 2.4 millimeter keratome was used to make a near-clear corneal incision at the 2:00 position.  A curvilinear capsulorrhexis was made with a cystotome and capsulorrhexis forceps.  Balanced salt solution was used to hydrodissect and hydrodelineate the nucleus.   Phacoemulsification was then used in stop and chop fashion to remove the lens nucleus and epinucleus.  The remaining cortex was then removed using the irrigation and aspiration handpiece. Healon was then placed into the capsular bag to distend it for lens placement.  A lens was then injected into the capsular bag.  The remaining viscoelastic was aspirated.   Wounds were  hydrated with balanced salt solution.  The anterior chamber was inflated to a physiologic pressure with balanced salt solution.  Intracameral vigamox 0.1 mL undiltued was injected into the eye and a drop placed onto the ocular surface.  No wound leaks were noted.  The patient was taken to the recovery room in stable condition without complications of anesthesia or surgery  Benay Pillow 12/29/2016, 10:28 AM

## 2016-12-29 NOTE — H&P (Signed)
The History and Physical notes are on paper, have been signed, and are to be scanned.   I have examined the patient and there are no changes to the H&P.   Benay Pillow 12/29/2016 9:56 AM

## 2016-12-29 NOTE — Anesthesia Postprocedure Evaluation (Signed)
Anesthesia Post Note  Patient: Wesley Small  Procedure(s) Performed: CATARACT EXTRACTION PHACO AND INTRAOCULAR LENS PLACEMENT (IOC) (Left Eye)  Patient location during evaluation: PACU Anesthesia Type: MAC Level of consciousness: awake and alert Pain management: pain level controlled Vital Signs Assessment: post-procedure vital signs reviewed and stable Respiratory status: spontaneous breathing, nonlabored ventilation, respiratory function stable and patient connected to nasal cannula oxygen Cardiovascular status: stable and blood pressure returned to baseline Postop Assessment: no apparent nausea or vomiting Anesthetic complications: no     Last Vitals:  Vitals:   12/29/16 1032 12/29/16 1046  BP: (!) 155/76 (!) 148/87  Pulse: 62 60  Resp: 16 16  Temp:    SpO2: 95% 97%    Last Pain:  Vitals:   12/29/16 0851  TempSrc: Oral                 Precious Haws Piscitello

## 2016-12-29 NOTE — Discharge Instructions (Signed)
Eye Surgery Discharge Instructions  Expect mild scratchy sensation or mild soreness. DO NOT RUB YOUR EYE!  The day of surgery:  Minimal physical activity, but bed rest is not required  No reading, computer work, or close hand work  No bending, lifting, or straining.  May watch TV  For 24 hours:  No driving, legal decisions, or alcoholic beverages  Safety precautions  Eat anything you prefer: It is better to start with liquids, then soup then solid foods.  _____ Eye patch should be worn until postoperative exam tomorrow.  ____ Solar shield eyeglasses should be worn for comfort in the sunlight/patch while sleeping  Resume all regular medications including aspirin or Coumadin if these were discontinued prior to surgery. You may shower, bathe, shave, or wash your hair. Tylenol may be taken for mild discomfort.  Call your doctor if you experience significant pain, nausea, or vomiting, fever > 101 or other signs of infection. [phone] or [phone] Specific instructions:  Follow-up Information    Eulogio Bear, MD Follow up on 12/30/2016.   Specialty:  Ophthalmology Why:  10:40 Contact information: [address] [place] [id number] [phone]

## 2016-12-29 NOTE — Anesthesia Preprocedure Evaluation (Signed)
Anesthesia Evaluation  Patient identified by MRN, date of birth, ID band Patient awake    Reviewed: Allergy & Precautions, NPO status , Patient's Chart, lab work & pertinent test results  History of Anesthesia Complications Negative for: history of anesthetic complications  Airway Mallampati: II  TM Distance: >3 FB Neck ROM: limited    Dental no notable dental hx.    Pulmonary neg sleep apnea, neg COPD, former smoker,    breath sounds clear to auscultation- rhonchi (-) wheezing      Cardiovascular hypertension, Pt. on medications (-) CAD, (-) Past MI and (-) Cardiac Stents + dysrhythmias + pacemaker (SSS)  Rhythm:Regular Rate:Normal - Systolic murmurs and - Diastolic murmurs    Neuro/Psych PSYCHIATRIC DISORDERS negative neurological ROS  negative psych ROS   GI/Hepatic Neg liver ROS, hiatal hernia, GERD  ,  Endo/Other  negative endocrine ROSneg diabetes  Renal/GU negative Renal ROS     Musculoskeletal  (+) Arthritis ,   Abdominal (+) - obese,   Peds  Hematology negative hematology ROS (+)   Anesthesia Other Findings Past Medical History: No date: Arthritis No date: Dysrhythmia     Comment:  RBBB No date: GERD (gastroesophageal reflux disease) No date: History of hiatal hernia No date: HOH (hard of hearing) No date: Hypertension No date: Presence of permanent cardiac pacemaker   Reproductive/Obstetrics                             Anesthesia Physical  Anesthesia Plan  ASA: III  Anesthesia Plan: MAC   Post-op Pain Management:    Induction: Intravenous  PONV Risk Score and Plan: 1 and Midazolam  Airway Management Planned: Natural Airway  Additional Equipment:   Intra-op Plan:   Post-operative Plan:   Informed Consent: I have reviewed the patients History and Physical, chart, labs and discussed the procedure including the risks, benefits and alternatives for the proposed  anesthesia with the patient or authorized representative who has indicated his/her understanding and acceptance.   Dental Advisory Given  Plan Discussed with: CRNA and Anesthesiologist  Anesthesia Plan Comments: (Patient consented for risks of anesthesia including but not limited to:  - adverse reactions to medications - damage to teeth, lips or other oral mucosa - sore throat or hoarseness - Damage to heart, brain, lungs or loss of life  Patient voiced understanding.)        Anesthesia Quick Evaluation

## 2016-12-31 ENCOUNTER — Other Ambulatory Visit: Payer: Self-pay | Admitting: Family Medicine

## 2017-02-02 ENCOUNTER — Ambulatory Visit (INDEPENDENT_AMBULATORY_CARE_PROVIDER_SITE_OTHER): Payer: Medicare Other | Admitting: Family Medicine

## 2017-02-02 ENCOUNTER — Encounter: Payer: Self-pay | Admitting: Emergency Medicine

## 2017-02-02 VITALS — BP 136/80 | HR 88 | Temp 97.7°F | Resp 16 | Wt 167.0 lb

## 2017-02-02 DIAGNOSIS — G4700 Insomnia, unspecified: Secondary | ICD-10-CM | POA: Diagnosis not present

## 2017-02-02 DIAGNOSIS — I10 Essential (primary) hypertension: Secondary | ICD-10-CM

## 2017-02-02 DIAGNOSIS — K219 Gastro-esophageal reflux disease without esophagitis: Secondary | ICD-10-CM | POA: Diagnosis not present

## 2017-02-02 DIAGNOSIS — E7800 Pure hypercholesterolemia, unspecified: Secondary | ICD-10-CM

## 2017-02-02 MED ORDER — ZOLPIDEM TARTRATE 10 MG PO TABS: 10.0000 mg | ORAL_TABLET | Freq: Every day | ORAL | 5 refills | Status: DC

## 2017-02-02 NOTE — Progress Notes (Signed)
Patient: Wesley Small Male    DOB: Aug 23, 1935   82 y.o.   MRN: [id number] Visit Date: 02/02/2017  Today's Provider: [name], MD   Chief Complaint  Patient presents with  . Hypertension   Subjective:    HPI  Hypertension, follow-up:  BP Readings from Last 3 Encounters:  02/02/17 136/80  12/29/16 (!) 148/87  11/24/16 (!) 169/75    He was last seen for hypertension 6 months ago.  BP at that visit was 148/87. Management since that visit includes nne. He reports good compliance with treatment. He is not having side effects.  He is not exercising. He is adherent to low salt diet.   Outside blood pressures are not being checked. Patient denies chest pain, chest pressure/discomfort, claudication, dyspnea, exertional chest pressure/discomfort, fatigue, irregular heart beat, lower extremity edema, near-syncope, orthopnea, palpitations, paroxysmal nocturnal dyspnea, syncope and tachypnea.    Wt Readings from Last 3 Encounters:  02/02/17 167 lb (75.8 kg)  12/29/16 161 lb (73 kg)  11/24/16 161 lb (73 kg)  ------------------------------------------------------------------------ Pt has not had much of an appetite the last couple of weeks sine he got the shingles vaccine. He thinks it was from the vaccine as it has resolved the last couple of days.       Allergies  Allergen Reactions  . Penicillins Rash and Other (See Comments)    Has patient had a PCN reaction causing immediate rash, facial/tongue/throat swelling, SOB or lightheadedness with hypotension: Yes Has patient had a PCN reaction causing severe rash involving mucus membranes or skin necrosis: No  Has patient had a PCN reaction that required hospitalization: No Has patient had a PCN reaction occurring within the last 10 years: No If all of the above answers are "NO", then may proceed with Cephalosporin use.      Current Outpatient Medications:  .  acetaminophen (TYLENOL) 500 MG tablet, Take 500  mg every 6 (six) hours as needed by mouth for moderate pain or headache. , Disp: , Rfl:  .  ascorbic acid (VITAMIN C) 500 MG tablet, Take 500 mg by mouth daily. , Disp: , Rfl:  .  aspirin 81 MG tablet, Take 81 mg by mouth 2 (two) times daily. , Disp: , Rfl:  .  atorvastatin (LIPITOR) 10 MG tablet, TAKE 1 TABLET BY MOUTH EVERY DAY (Patient taking differently: TAKE 10 MG BY MOUTH EVERY DAY AT BEDTIME), Disp: 90 tablet, Rfl: 3 .  Cholecalciferol (VITAMIN D3) 2000 units TABS, Take 2,000 Units by mouth daily., Disp: , Rfl:  .  Coenzyme Q10 100 MG capsule, Take 100 mg daily by mouth. , Disp: , Rfl:  .  losartan (COZAAR) 100 MG tablet, TAKE 1 TABLET BY MOUTH ONCE A DAY, Disp: 30 tablet, Rfl: 12 .  Magnesium Oxide 400 MG CAPS, Take 400 mg by mouth daily. , Disp: , Rfl:  .  metoprolol succinate (TOPROL-XL) 25 MG 24 hr tablet, Take 25 mg daily by mouth. , Disp: , Rfl:  .  NON FORMULARY, Apply 1 mL daily topically. Testosterone 20% 90gm, Disp: , Rfl:  .  Omega-3 Fatty Acids (SALMON OIL-1000 PO), Take 1,000 mg by mouth daily., Disp: , Rfl:  .  omeprazole (PRILOSEC) 20 MG capsule, Take 20 mg by mouth at bedtime, Disp: , Rfl:  .  traMADol (ULTRAM) 50 MG tablet, TAKE 1 TABLET BY MOUTH 2 TIMES DAILY (Patient taking differently: Take 50 mg by mouth twice daily as needed for pain), Disp: 60  tablet, Rfl: 5 .  vitamin B-12 (CYANOCOBALAMIN) 500 MCG tablet, Take 500 mcg by mouth daily., Disp: , Rfl:  .  zolpidem (AMBIEN) 10 MG tablet, Take 1 tablet (10 mg total) by mouth at bedtime. (Patient taking differently: Take 10 mg at bedtime as needed by mouth for sleep. ), Disp: 30 tablet, Rfl: 5 .  hydrocortisone 2.5 % cream, Apply topically 2 (two) times daily. (Patient not taking: Reported on 11/18/2016), Disp: 30 g, Rfl: 3  Review of Systems  Constitutional: Negative.   HENT: Negative.   Eyes: Negative.   Respiratory: Negative.   Cardiovascular: Negative.   Gastrointestinal: Negative.   Endocrine: Negative.     Genitourinary: Negative.   Musculoskeletal: Negative.   Skin: Negative.   Allergic/Immunologic: Negative.   Neurological: Negative.   Hematological: Negative.   Psychiatric/Behavioral: Negative.     Social History   Tobacco Use  . Smoking status: Former Research scientist (life sciences)  . Smokeless tobacco: Never Used  Substance Use Topics  . Alcohol use: Yes    Alcohol/week: 4.2 - 4.8 oz    Types: 7 - 8 Shots of liquor per week   Objective:   BP 136/80 (BP Location: Right Arm, Patient Position: Sitting, Cuff Size: Normal)   Pulse 88   Temp 97.7 F (36.5 C) (Oral)   Resp 16   Wt 167 lb (75.8 kg)   BMI 26.16 kg/m  Vitals:   02/02/17 1024  BP: 136/80  Pulse: 88  Resp: 16  Temp: 97.7 F (36.5 C)  TempSrc: Oral  Weight: 167 lb (75.8 kg)     Physical Exam  Constitutional: He is oriented to person, place, and time. He appears well-developed and well-nourished.  HENT:  Head: Normocephalic and atraumatic.  Right Ear: External ear normal.  Left Ear: External ear normal.  Nose: Nose normal.  Eyes: Conjunctivae are normal. No scleral icterus.  Neck: No thyromegaly present.  Cardiovascular: Normal rate, regular rhythm and normal heart sounds.  Pulmonary/Chest: Effort normal and breath sounds normal.  Abdominal: Soft.  Neurological: He is alert and oriented to person, place, and time.  Skin: Skin is warm and dry.  Psychiatric: He has a normal mood and affect. His behavior is normal. Judgment and thought content normal.        Assessment & Plan:     1. Insomnia, persistent Pt again advised to cut down on the dosing but he states he has done this in the past and does not work.  He has no desire to cut back on Ambien - zolpidem (AMBIEN) 10 MG tablet; Take 1 tablet (10 mg total) by mouth at bedtime.  Dispense: 30 tablet; Refill: 5  2. Essential (primary) hypertension   3. Hypercholesterolemia   4. Gastroesophageal reflux disease without esophagitis 5.Recent weight loss Most likely  secondary to side effects from shingles vaccine.  Patient will follow his weight at home.      I have done the exam and reviewed the above chart and it is accurate to the best of my knowledge. Development worker, community has been used in this note in any air is in the dictation or transcription are unintentional.  [name], MD  [place]

## 2017-02-03 ENCOUNTER — Encounter: Payer: Self-pay | Admitting: Family Medicine

## 2017-02-06 DIAGNOSIS — L820 Inflamed seborrheic keratosis: Secondary | ICD-10-CM | POA: Diagnosis not present

## 2017-02-06 DIAGNOSIS — Z1283 Encounter for screening for malignant neoplasm of skin: Secondary | ICD-10-CM | POA: Diagnosis not present

## 2017-02-06 DIAGNOSIS — D1800 Hemangioma unspecified site: Secondary | ICD-10-CM | POA: Diagnosis not present

## 2017-02-06 DIAGNOSIS — L570 Actinic keratosis: Secondary | ICD-10-CM | POA: Diagnosis not present

## 2017-02-06 DIAGNOSIS — D485 Neoplasm of uncertain behavior of skin: Secondary | ICD-10-CM | POA: Diagnosis not present

## 2017-02-06 DIAGNOSIS — L812 Freckles: Secondary | ICD-10-CM | POA: Diagnosis not present

## 2017-02-06 DIAGNOSIS — L578 Other skin changes due to chronic exposure to nonionizing radiation: Secondary | ICD-10-CM | POA: Diagnosis not present

## 2017-02-06 DIAGNOSIS — D229 Melanocytic nevi, unspecified: Secondary | ICD-10-CM | POA: Diagnosis not present

## 2017-02-06 DIAGNOSIS — L905 Scar conditions and fibrosis of skin: Secondary | ICD-10-CM | POA: Diagnosis not present

## 2017-02-06 DIAGNOSIS — L719 Rosacea, unspecified: Secondary | ICD-10-CM | POA: Diagnosis not present

## 2017-05-04 ENCOUNTER — Other Ambulatory Visit: Payer: Self-pay | Admitting: Family Medicine

## 2017-05-25 DIAGNOSIS — I495 Sick sinus syndrome: Secondary | ICD-10-CM | POA: Diagnosis not present

## 2017-05-25 DIAGNOSIS — I10 Essential (primary) hypertension: Secondary | ICD-10-CM | POA: Diagnosis not present

## 2017-05-25 DIAGNOSIS — E782 Mixed hyperlipidemia: Secondary | ICD-10-CM | POA: Diagnosis not present

## 2017-06-19 DIAGNOSIS — Z79899 Other long term (current) drug therapy: Secondary | ICD-10-CM | POA: Diagnosis not present

## 2017-06-19 DIAGNOSIS — N5201 Erectile dysfunction due to arterial insufficiency: Secondary | ICD-10-CM | POA: Diagnosis not present

## 2017-06-19 DIAGNOSIS — E291 Testicular hypofunction: Secondary | ICD-10-CM | POA: Diagnosis not present

## 2017-06-19 DIAGNOSIS — N401 Enlarged prostate with lower urinary tract symptoms: Secondary | ICD-10-CM | POA: Diagnosis not present

## 2017-06-19 DIAGNOSIS — D400 Neoplasm of uncertain behavior of prostate: Secondary | ICD-10-CM | POA: Diagnosis not present

## 2017-07-07 ENCOUNTER — Telehealth: Payer: Self-pay

## 2017-07-07 NOTE — Telephone Encounter (Signed)
LMTCB and schedule AWV after 08/03/17. -MM

## 2017-07-12 NOTE — Telephone Encounter (Signed)
Scheduled for 08/15/17 @ 11. -MM

## 2017-07-20 ENCOUNTER — Other Ambulatory Visit: Payer: Self-pay | Admitting: Family Medicine

## 2017-07-20 DIAGNOSIS — G4700 Insomnia, unspecified: Secondary | ICD-10-CM

## 2017-08-15 ENCOUNTER — Ambulatory Visit (INDEPENDENT_AMBULATORY_CARE_PROVIDER_SITE_OTHER): Payer: Medicare Other

## 2017-08-15 VITALS — BP 142/78 | HR 88 | Temp 97.9°F | Ht 67.0 in | Wt 169.2 lb

## 2017-08-15 DIAGNOSIS — Z0000 Encounter for general adult medical examination without abnormal findings: Secondary | ICD-10-CM | POA: Diagnosis not present

## 2017-08-15 NOTE — Patient Instructions (Signed)
[name] , Thank you for taking time to come for your Medicare Wellness Visit. I appreciate your ongoing commitment to your health goals. Please review the following plan we discussed and let me know if I can assist you in the future.   Screening recommendations/referrals: Colonoscopy: Up to date Recommended yearly ophthalmology/optometry visit for glaucoma screening and checkup Recommended yearly dental visit for hygiene and checkup  Vaccinations: Influenza vaccine: N/A Pneumococcal vaccine: Up to date Tdap vaccine: Up to date Shingles vaccine: Up to date    Advanced directives: Please bring a copy of your POA (Power of Saline) and/or Living Will to your next appointment.   Conditions/risks identified: Recommend increasing water intake to 4 glasses a day.   Next appointment: 11/23/17 @ 2 PM with Dr Rosanna Randy.  Preventive Care 80 Years and Older, Male Preventive care refers to lifestyle choices and visits with your health care provider that can promote health and wellness. What does preventive care include?  A yearly physical exam. This is also called an annual well check.  Dental exams once or twice a year.  Routine eye exams. Ask your health care provider how often you should have your eyes checked.  Personal lifestyle choices, including:  Daily care of your teeth and gums.  Regular physical activity.  Eating a healthy diet.  Avoiding tobacco and drug use.  Limiting alcohol use.  Practicing safe sex.  Taking low doses of aspirin every day.  Taking vitamin and mineral supplements as recommended by your health care provider. What happens during an annual well check? The services and screenings done by your health care provider during your annual well check will depend on your age, overall health, lifestyle risk factors, and family history of disease. Counseling  Your health care provider may ask you questions about your:  Alcohol use.  Tobacco use.  Drug  use.  Emotional well-being.  Home and relationship well-being.  Sexual activity.  Eating habits.  History of falls.  Memory and ability to understand (cognition).  Work and work Statistician. Screening  You may have the following tests or measurements:  Height, weight, and BMI.  Blood pressure.  Lipid and cholesterol levels. These may be checked every 5 years, or more frequently if you are over 72 years old.  Skin check.  Lung cancer screening. You may have this screening every year starting at age 25 if you have a 30-pack-year history of smoking and currently smoke or have quit within the past 15 years.  Fecal occult blood test (FOBT) of the stool. You may have this test every year starting at age 37.  Flexible sigmoidoscopy or colonoscopy. You may have a sigmoidoscopy every 5 years or a colonoscopy every 10 years starting at age 67.  Prostate cancer screening. Recommendations will vary depending on your family history and other risks.  Hepatitis C blood test.  Hepatitis B blood test.  Sexually transmitted disease (STD) testing.  Diabetes screening. This is done by checking your blood sugar (glucose) after you have not eaten for a while (fasting). You may have this done every 1-3 years.  Abdominal aortic aneurysm (AAA) screening. You may need this if you are a current or former smoker.  Osteoporosis. You may be screened starting at age 60 if you are at high risk. Talk with your health care provider about your test results, treatment options, and if necessary, the need for more tests. Vaccines  Your health care provider may recommend certain vaccines, such as:  Influenza vaccine. This  is recommended every year.  Tetanus, diphtheria, and acellular pertussis (Tdap, Td) vaccine. You may need a Td booster every 10 years.  Zoster vaccine. You may need this after age 32.  Pneumococcal 13-valent conjugate (PCV13) vaccine. One dose is recommended after age  58.  Pneumococcal polysaccharide (PPSV23) vaccine. One dose is recommended after age 15. Talk to your health care provider about which screenings and vaccines you need and how often you need them. This information is not intended to replace advice given to you by your health care provider. Make sure you discuss any questions you have with your health care provider. Document Released: 01/23/2015 Document Revised: 09/16/2015 Document Reviewed: 10/28/2014 Elsevier Interactive Patient Education  2017 [place] Prevention in the Home Falls can cause injuries. They can happen to people of all ages. There are many things you can do to make your home safe and to help prevent falls. What can I do on the outside of my home?  Regularly fix the edges of walkways and driveways and fix any cracks.  Remove anything that might make you trip as you walk through a door, such as a raised step or threshold.  Trim any bushes or trees on the path to your home.  Use bright outdoor lighting.  Clear any walking paths of anything that might make someone trip, such as rocks or tools.  Regularly check to see if handrails are loose or broken. Make sure that both sides of any steps have handrails.  Any raised decks and porches should have guardrails on the edges.  Have any leaves, snow, or ice cleared regularly.  Use sand or salt on walking paths during winter.  Clean up any spills in your garage right away. This includes oil or grease spills. What can I do in the bathroom?  Use night lights.  Install grab bars by the toilet and in the tub and shower. Do not use towel bars as grab bars.  Use non-skid mats or decals in the tub or shower.  If you need to sit down in the shower, use a plastic, non-slip stool.  Keep the floor dry. Clean up any water that spills on the floor as soon as it happens.  Remove soap buildup in the tub or shower regularly.  Attach bath mats securely with double-sided  non-slip rug tape.  Do not have throw rugs and other things on the floor that can make you trip. What can I do in the bedroom?  Use night lights.  Make sure that you have a light by your bed that is easy to reach.  Do not use any sheets or blankets that are too big for your bed. They should not hang down onto the floor.  Have a firm chair that has side arms. You can use this for support while you get dressed.  Do not have throw rugs and other things on the floor that can make you trip. What can I do in the kitchen?  Clean up any spills right away.  Avoid walking on wet floors.  Keep items that you use a lot in easy-to-reach places.  If you need to reach something above you, use a strong step stool that has a grab bar.  Keep electrical cords out of the way.  Do not use floor polish or wax that makes floors slippery. If you must use wax, use non-skid floor wax.  Do not have throw rugs and other things on the floor that can make you  trip. What can I do with my stairs?  Do not leave any items on the stairs.  Make sure that there are handrails on both sides of the stairs and use them. Fix handrails that are broken or loose. Make sure that handrails are as long as the stairways.  Check any carpeting to make sure that it is firmly attached to the stairs. Fix any carpet that is loose or worn.  Avoid having throw rugs at the top or bottom of the stairs. If you do have throw rugs, attach them to the floor with carpet tape.  Make sure that you have a light switch at the top of the stairs and the bottom of the stairs. If you do not have them, ask someone to add them for you. What else can I do to help prevent falls?  Wear shoes that:  Do not have high heels.  Have rubber bottoms.  Are comfortable and fit you well.  Are closed at the toe. Do not wear sandals.  If you use a stepladder:  Make sure that it is fully opened. Do not climb a closed stepladder.  Make sure that both  sides of the stepladder are locked into place.  Ask someone to hold it for you, if possible.  Clearly mark and make sure that you can see:  Any grab bars or handrails.  First and last steps.  Where the edge of each step is.  Use tools that help you move around (mobility aids) if they are needed. These include:  Canes.  Walkers.  Scooters.  Crutches.  Turn on the lights when you go into a dark area. Replace any light bulbs as soon as they burn out.  Set up your furniture so you have a clear path. Avoid moving your furniture around.  If any of your floors are uneven, fix them.  If there are any pets around you, be aware of where they are.  Review your medicines with your doctor. Some medicines can make you feel dizzy. This can increase your chance of falling. Ask your doctor what other things that you can do to help prevent falls. This information is not intended to replace advice given to you by your health care provider. Make sure you discuss any questions you have with your health care provider. Document Released: 10/23/2008 Document Revised: 06/04/2015 Document Reviewed: 01/31/2014 Elsevier Interactive Patient Education  2017 [employer].

## 2017-08-15 NOTE — Progress Notes (Signed)
Subjective:   Wesley Small is a 82 y.o. male who presents for Medicare Annual/Subsequent preventive examination.  Review of Systems:  N/A  Cardiac Risk Factors include: advanced age (>46men, >28 women);dyslipidemia;hypertension;male gender     Objective:    Vitals: BP (!) 142/78 (BP Location: Right Arm)   Pulse 88   Temp 97.9 F (36.6 C) (Oral)   Ht 5\' 7"  (1.702 m)   Wt 169 lb 3.2 oz (76.7 kg)   BMI 26.50 kg/m   Body mass index is 26.5 kg/m.  Advanced Directives 08/15/2017 12/29/2016 11/24/2016 08/02/2016  Does Patient Have a Medical Advance Directive? Yes Yes No;Yes Yes  Type of Paramedic of [place];Living will [place];Living will [place];Living will Living will;Healthcare Power of Attorney  Does patient want to make changes to medical advance directive? - No - Patient declined - -  Copy of [place] in Chart? No - copy requested Yes - No - copy requested  Would patient like information on creating a medical advance directive? - - No - Patient declined -    Tobacco Social History   Tobacco Use  Smoking Status Former Smoker  . Types: Cigarettes  Smokeless Tobacco Never Used  Tobacco Comment   quit around 1970     Counseling given: Not Answered Comment: quit around 1970   Clinical Intake:  Pre-visit preparation completed: Yes  Pain : No/denies pain Pain Score: 0-No pain     Nutritional Status: BMI 25 -29 Overweight Nutritional Risks: None Diabetes: No  How often do you need to have someone help you when you read instructions, pamphlets, or other written materials from your doctor or pharmacy?: 1 - Never  Interpreter Needed?: No  Information entered by :: [facility], LPN  Past Medical History:  Diagnosis Date  . Arthritis   . Dysrhythmia    RBBB  . GERD (gastroesophageal reflux disease)   . History of blood transfusion   . History of diverticulitis   .  History of hiatal hernia   . HOH (hard of hearing)   . Hypercholesterolemia   . Hypertension   . Presence of permanent cardiac pacemaker   . Sleep apnea    Past Surgical History:  Procedure Laterality Date  . CATARACT EXTRACTION W/PHACO Right 11/24/2016   Procedure: CATARACT EXTRACTION PHACO AND INTRAOCULAR LENS PLACEMENT (IOC);  Surgeon: Eulogio Bear, MD;  Location: [facility] ORS;  Service: Ophthalmology;  Laterality: Right;  Lot # [id number] H [place]:   00.31  AP%: 10.9 CDE:  3.39  . CATARACT EXTRACTION W/PHACO Left 12/29/2016   Procedure: CATARACT EXTRACTION PHACO AND INTRAOCULAR LENS PLACEMENT (IOC);  Surgeon: Eulogio Bear, MD;  Location: [facility] ORS;  Service: Ophthalmology;  Laterality: Left;  [place] 00:33.8 AP% 8.5 CDE 2.86 Fluid Pack lot # [id number]  . INJECTION KNEE    . PACEMAKER INSERTION    . SKIN GRAFT    . TONSILLECTOMY AND ADENOIDECTOMY    . TRANSURETHRAL RESECTION OF PROSTATE     Family History  Problem Relation Age of Onset  . Heart disease Father    Social History   Socioeconomic History  . Marital status: Widowed    Spouse name: Not on file  . Number of children: 1  . Years of education: Not on file  . Highest education level: Some college, no degree  Occupational History  . Occupation: retired  Scientific laboratory technician  . Financial resource strain: Not hard at all  . Food  insecurity:    Worry: Never true    Inability: Never true  . Transportation needs:    Medical: No    Non-medical: No  Tobacco Use  . Smoking status: Former Smoker    Types: Cigarettes  . Smokeless tobacco: Never Used  . Tobacco comment: quit around 1970  Substance and Sexual Activity  . Alcohol use: Yes    Alcohol/week: 4.2 - 4.8 oz    Types: 7 - 8 Shots of liquor per week  . Drug use: No  . Sexual activity: Not on file  Lifestyle  . Physical activity:    Days per week: Not on file    Minutes per session: Not on file  . Stress: Not at all  Relationships  . Social connections:    Talks  on phone: Not on file    Gets together: Not on file    Attends religious service: Not on file    Active member of club or organization: Not on file    Attends meetings of clubs or organizations: Not on file    Relationship status: Not on file  Other Topics Concern  . Not on file  Social History Narrative  . Not on file    Outpatient Encounter Medications as of 08/15/2017  Medication Sig  . acetaminophen (TYLENOL) 500 MG tablet Take 500 mg every 6 (six) hours as needed by mouth for moderate pain or headache.   Marland Kitchen ascorbic acid (VITAMIN C) 500 MG tablet Take 500 mg by mouth daily.   Marland Kitchen aspirin 81 MG tablet Take 81 mg by mouth 2 (two) times daily.   Marland Kitchen atorvastatin (LIPITOR) 10 MG tablet TAKE 1 TABLET BY MOUTH EVERY DAY (Patient taking differently: TAKE 10 MG BY MOUTH EVERY DAY AT BEDTIME)  . Cholecalciferol (VITAMIN D3) 2000 units TABS Take 2,000 Units by mouth daily.  . Coenzyme Q10 100 MG capsule Take 100 mg daily by mouth.   . hydrocortisone 2.5 % cream Apply topically 2 (two) times daily. (Patient taking differently: Apply topically 2 (two) times daily. As needed)  . losartan (COZAAR) 100 MG tablet TAKE 1 TABLET BY MOUTH ONCE A DAY  . Magnesium Oxide 400 MG CAPS Take 400 mg by mouth daily.   . metoprolol succinate (TOPROL-XL) 25 MG 24 hr tablet Take 25 mg daily by mouth.   . NON FORMULARY Apply 1 mL topically every other day. Testosterone 20% 90gm   . Omega-3 Fatty Acids (SALMON OIL-1000 PO) Take 1,000 mg by mouth daily.  Marland Kitchen omeprazole (PRILOSEC) 20 MG capsule Take 20 mg by mouth at bedtime  . traMADol (ULTRAM) 50 MG tablet Take 50 mg by mouth twice daily as needed for pain  . vitamin B-12 (CYANOCOBALAMIN) 500 MCG tablet Take 500 mcg by mouth daily.  Marland Kitchen zolpidem (AMBIEN) 10 MG tablet TAKE 1 TABLET BY MOUTH AT BEDTIME   No facility-administered encounter medications on file as of 08/15/2017.     Activities of Daily Living In your present state of health, do you have any difficulty  performing the following activities: 08/15/2017  Hearing? Y  Comment Wears bilateral hearing aids.  Vision? N  Difficulty concentrating or making decisions? N  Walking or climbing stairs? N  Dressing or bathing? N  Doing errands, shopping? N  Preparing Food and eating ? N  Using the Toilet? N  In the past six months, have you accidently leaked urine? N  Do you have problems with loss of bowel control? N  Managing your Medications? N  Managing your Finances? N  Housekeeping or managing your Housekeeping? N  Some recent data might be hidden    Patient Care Team: Jerrol Banana., MD as PCP - General (Family Medicine) Eulogio Bear, MD as Consulting Physician (Ophthalmology) Thornton Park, MD as Referring Physician (Orthopedic Surgery) Dimmig, Marcello Moores, MD as Referring Physician (Orthopedic Surgery) Royston Cowper, MD as Consulting Physician (Urology) Ubaldo Glassing Javier Docker, MD as Consulting Physician (Cardiology) Ralene Bathe, MD as Consulting Physician (Dermatology)   Assessment:   This is a routine wellness examination for Wesley Small.  Exercise Activities and Dietary recommendations Current Exercise Habits: The patient does not participate in regular exercise at present, Exercise limited by: None identified  Goals    . DIET - INCREASE WATER INTAKE     Recommend increasing water intake to 4 glasses a day.        Fall Risk Fall Risk  08/15/2017 08/02/2016 07/30/2015 11/10/2014  Falls in the past year? No No No No   Is the patient's home free of loose throw rugs in walkways, pet beds, electrical cords, etc?   yes      Grab bars in the bathroom? no      Handrails on the stairs?   yes      Adequate lighting?   yes  Timed Get Up and Go Performed: N/A  Depression Screen PHQ 2/9 Scores 08/15/2017 08/02/2016 07/30/2015 11/10/2014  PHQ - 2 Score 0 0 0 0    Cognitive Function: Pt declined screening today.      6CIT Screen 08/02/2016  What Year? 0 points  What month? 0  points  What time? 0 points  Count back from 20 0 points  Months in reverse 0 points  Repeat phrase 0 points  Total Score 0    Immunization History  Administered Date(s) Administered  . Influenza, High Dose Seasonal PF 11/10/2014, 11/04/2015  . Pneumococcal Conjugate-13 05/05/2014  . Pneumococcal Polysaccharide-23 08/02/2016  . Tdap 10/12/2010  . Zoster Recombinat (Shingrix) 11/10/2016    Qualifies for Shingles Vaccine? Due for Shingles vaccine. Declined my offer to administer today. Education has been provided regarding the importance of this vaccine. Pt has been advised to call her insurance company to determine her out of pocket expense. Advised she may also receive this vaccine at her local pharmacy or Health Dept. Verbalized acceptance and understanding.  Screening Tests Health Maintenance  Topic Date Due  . INFLUENZA VACCINE  08/10/2017  . TETANUS/TDAP  10/11/2020  . PNA vac Low Risk Adult  Completed   Cancer Screenings: Lung: Low Dose CT Chest recommended if Age 72-80 years, 30 pack-year currently smoking OR have quit w/in 15years. Patient does not qualify. Colorectal: Up to date  Additional Screenings:  Hepatitis C Screening: N/A      Plan:  I have personally reviewed and addressed the Medicare Annual Wellness questionnaire and have noted the following in the patient's chart:  A. Medical and social history B. Use of alcohol, tobacco or illicit drugs  C. Current medications and supplements D. Functional ability and status E.  Nutritional status F.  Physical activity G. Advance directives H. List of other physicians I.  Hospitalizations, surgeries, and ER visits in previous 12 months J.  [place] such as hearing and vision if needed, cognitive and depression L. Referrals and appointments - none  In addition, I have reviewed and discussed with patient certain preventive protocols, quality metrics, and best practice recommendations. A written  personalized care plan for preventive  services as well as general preventive health recommendations were provided to patient.  See attached scanned questionnaire for additional information.   Signed,  Fabio Neighbors, LPN Nurse Health Advisor   Nurse Recommendations: None.

## 2017-10-09 ENCOUNTER — Other Ambulatory Visit: Payer: Self-pay | Admitting: Family Medicine

## 2017-10-09 DIAGNOSIS — Z23 Encounter for immunization: Secondary | ICD-10-CM | POA: Diagnosis not present

## 2017-10-24 ENCOUNTER — Other Ambulatory Visit: Payer: Self-pay | Admitting: Family Medicine

## 2017-11-21 DIAGNOSIS — I495 Sick sinus syndrome: Secondary | ICD-10-CM | POA: Diagnosis not present

## 2017-11-21 DIAGNOSIS — E782 Mixed hyperlipidemia: Secondary | ICD-10-CM | POA: Diagnosis not present

## 2017-11-21 DIAGNOSIS — I10 Essential (primary) hypertension: Secondary | ICD-10-CM | POA: Diagnosis not present

## 2017-11-23 ENCOUNTER — Ambulatory Visit (INDEPENDENT_AMBULATORY_CARE_PROVIDER_SITE_OTHER): Payer: Medicare Other | Admitting: Family Medicine

## 2017-11-23 ENCOUNTER — Encounter: Payer: Self-pay | Admitting: Family Medicine

## 2017-11-23 VITALS — BP 118/86 | HR 68 | Temp 98.3°F | Resp 16 | Ht 67.0 in | Wt 171.0 lb

## 2017-11-23 DIAGNOSIS — Z1211 Encounter for screening for malignant neoplasm of colon: Secondary | ICD-10-CM | POA: Diagnosis not present

## 2017-11-23 DIAGNOSIS — M791 Myalgia, unspecified site: Secondary | ICD-10-CM

## 2017-11-23 DIAGNOSIS — Z125 Encounter for screening for malignant neoplasm of prostate: Secondary | ICD-10-CM | POA: Diagnosis not present

## 2017-11-23 DIAGNOSIS — M2550 Pain in unspecified joint: Secondary | ICD-10-CM

## 2017-11-23 DIAGNOSIS — I10 Essential (primary) hypertension: Secondary | ICD-10-CM

## 2017-11-23 DIAGNOSIS — C61 Malignant neoplasm of prostate: Secondary | ICD-10-CM | POA: Diagnosis not present

## 2017-11-23 NOTE — Progress Notes (Signed)
[place]      Patient: Wesley Small, Male    DOB: 09/12/1935, 82 y.o.   MRN: [id number] Visit Date: 11/23/2017  Today's Provider: [name], MD   Chief Complaint  Patient presents with  . Medicare Wellness   Subjective:      Wesley Small is a 82 y.o. male. He feels fairly well.  Pt reports finding a tick on him several months ago.  Pt states he has been feeling "Achy and sore" in joints.  He reports exercising some. He reports he is sleeping well.  He moved to [place] from [place] in 2007.  He has been dating a lady for 9 years now he says.  No plans to marry.  Overall he feels well.  Does state that he had a tick bite this summer and since that time he has had joint and muscle aches.  -----------------------------------------------------------   Review of Systems  Constitutional: Negative.   HENT: Positive for hearing loss and tinnitus. Negative for congestion, dental problem, drooling, ear discharge, ear pain, facial swelling, mouth sores, nosebleeds, postnasal drip, rhinorrhea, sinus pressure, sinus pain, sneezing, sore throat, trouble swallowing and voice change.   Eyes: Negative.   Respiratory: Negative.   Cardiovascular: Negative.   Gastrointestinal: Negative.   Endocrine: Negative.   Genitourinary: Negative.   Musculoskeletal: Positive for arthralgias and back pain. Negative for gait problem, joint swelling, myalgias, neck pain and neck stiffness.  Skin: Negative.   Allergic/Immunologic: Negative.   Neurological: Negative.   Hematological: Negative.   Psychiatric/Behavioral: Negative.     Social History   Socioeconomic History  . Marital status: Widowed    Spouse name: Not on file  . Number of children: 1  . Years of education: Not on file  . Highest education level: Some college, no degree  Occupational History  . Occupation: retired  Scientific laboratory technician  . Financial resource strain: Not hard at all  . Food insecurity:    Worry: Never true     Inability: Never true  . Transportation needs:    Medical: No    Non-medical: No  Tobacco Use  . Smoking status: Former Smoker    Types: Cigarettes  . Smokeless tobacco: Never Used  . Tobacco comment: quit around 1970  Substance and Sexual Activity  . Alcohol use: Yes    Alcohol/week: 7.0 - 8.0 standard drinks    Types: 7 - 8 Shots of liquor per week  . Drug use: No  . Sexual activity: Not on file  Lifestyle  . Physical activity:    Days per week: Not on file    Minutes per session: Not on file  . Stress: Not at all  Relationships  . Social connections:    Talks on phone: Not on file    Gets together: Not on file    Attends religious service: Not on file    Active member of club or organization: Not on file    Attends meetings of clubs or organizations: Not on file    Relationship status: Not on file  . Intimate partner violence:    Fear of current or ex partner: Not on file    Emotionally abused: Not on file    Physically abused: Not on file    Forced sexual activity: Not on file  Other Topics Concern  . Not on file  Social History Narrative  . Not on file    Past Medical History:  Diagnosis Date  . Arthritis   .  Dysrhythmia    RBBB  . GERD (gastroesophageal reflux disease)   . History of blood transfusion   . History of diverticulitis   . History of hiatal hernia   . HOH (hard of hearing)   . Hypercholesterolemia   . Hypertension   . Presence of permanent cardiac pacemaker   . Sleep apnea      Patient Active Problem List   Diagnosis Date Noted  . Osteoarthritis of knee 08/02/2016  . Adjustment disorder with depressed mood 10/29/2014  . Arthritis 10/29/2014  . Insomnia, persistent 10/29/2014  . Hypercholesterolemia 10/29/2014  . BP (high blood pressure) 10/29/2014  . Artificial cardiac pacemaker 10/29/2014  . Sick sinus syndrome ([place]) 11/11/2013  . 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 Acid reflux 05/22/2009  . Essential (primary) hypertension  01/13/2009    Past Surgical History:  Procedure Laterality Date  . CATARACT EXTRACTION W/PHACO Right 11/24/2016   Procedure: CATARACT EXTRACTION PHACO AND INTRAOCULAR LENS PLACEMENT (IOC);  Surgeon: Eulogio Bear, MD;  Location: [facility] ORS;  Service: Ophthalmology;  Laterality: Right;  Lot # [id number] H [place]:   00.31  AP%: 10.9 CDE:  3.39  . CATARACT EXTRACTION W/PHACO Left 12/29/2016   Procedure: CATARACT EXTRACTION PHACO AND INTRAOCULAR LENS PLACEMENT (IOC);  Surgeon: Eulogio Bear, MD;  Location: [facility] ORS;  Service: Ophthalmology;  Laterality: Left;  [place] 00:33.8 AP% 8.5 CDE 2.86 Fluid Pack lot # [id number]  . INJECTION KNEE    . PACEMAKER INSERTION    . SKIN GRAFT    . TONSILLECTOMY AND ADENOIDECTOMY    . TRANSURETHRAL RESECTION OF PROSTATE      His family history includes Heart disease in his father.      Current Outpatient Medications:  .  acetaminophen (TYLENOL) 500 MG tablet, Take 500 mg every 6 (six) hours as needed by mouth for moderate pain or headache. , Disp: , Rfl:  .  ascorbic acid (VITAMIN C) 500 MG tablet, Take 500 mg by mouth daily. , Disp: , Rfl:  .  aspirin 81 MG tablet, Take 81 mg by mouth 2 (two) times daily. , Disp: , Rfl:  .  atorvastatin (LIPITOR) 10 MG tablet, TAKE 10 MG BY MOUTH EVERY DAY AT BEDTIME, Disp: 90 tablet, Rfl: 3 .  Cholecalciferol (VITAMIN D3) 2000 units TABS, Take 2,000 Units by mouth daily., Disp: , Rfl:  .  Coenzyme Q10 100 MG capsule, Take 100 mg daily by mouth. , Disp: , Rfl:  .  hydrocortisone 2.5 % cream, Apply topically 2 (two) times daily. (Patient taking differently: Apply topically 2 (two) times daily. As needed), Disp: 30 g, Rfl: 3 .  losartan (COZAAR) 100 MG tablet, TAKE 1 TABLET BY MOUTH ONCE A DAY, Disp: 30 tablet, Rfl: 12 .  Magnesium Oxide 400 MG CAPS, Take 400 mg by mouth daily. , Disp: , Rfl:  .  metoprolol succinate (TOPROL-XL) 25 MG 24 hr tablet, Take 25 mg daily by mouth. , Disp: , Rfl:  .  NON FORMULARY, Apply 1 mL  topically every other day. Testosterone 20% 90gm , Disp: , Rfl:  .  Omega-3 Fatty Acids (SALMON OIL-1000 PO), Take 1,000 mg by mouth daily., Disp: , Rfl:  .  omeprazole (PRILOSEC) 20 MG capsule, Take 20 mg by mouth at bedtime, Disp: , Rfl:  .  traMADol (ULTRAM) 50 MG tablet, TAKE 1 TABLET BY MOUTH TWICE (2) DAILY AS NEEDED FOR PAIN, Disp: 60 tablet, Rfl: 3 .  vitamin B-12 (CYANOCOBALAMIN) 500 MCG tablet, Take 500 mcg by mouth  daily., Disp: , Rfl:  .  zolpidem (AMBIEN) 10 MG tablet, TAKE 1 TABLET BY MOUTH AT BEDTIME, Disp: 30 tablet, Rfl: 5  Patient Care Team: Jerrol Banana., MD as PCP - General (Family Medicine) Eulogio Bear, MD as Consulting Physician (Ophthalmology) Thornton Park, MD as Referring Physician (Orthopedic Surgery) Dimmig, Marcello Moores, MD as Referring Physician (Orthopedic Surgery) Royston Cowper, MD as Consulting Physician (Urology) Teodoro Spray, MD as Consulting Physician (Cardiology) Ralene Bathe, MD as Consulting Physician (Dermatology)     Objective:   Vitals: BP 118/86 (BP Location: Right Arm, Patient Position: Sitting, Cuff Size: Normal)   Pulse 68   Temp 98.3 F (36.8 C) (Oral)   Resp 16   Ht 5\' 7"  (1.702 m)   Wt 171 lb (77.6 kg)   BMI 26.78 kg/m   Physical Exam  Constitutional: He is oriented to person, place, and time. He appears well-developed and well-nourished.  HENT:  Head: Normocephalic and atraumatic.  Right Ear: External ear normal.  Left Ear: External ear normal.  Nose: Nose normal.  Mouth/Throat: Oropharynx is clear and moist.  Eyes: Conjunctivae are normal. No scleral icterus.  Neck: No thyromegaly present.  Cardiovascular: Normal rate, regular rhythm, normal heart sounds and intact distal pulses.  Pulmonary/Chest: Effort normal and breath sounds normal.  Abdominal: Soft.  Musculoskeletal: He exhibits no edema.  Neurological: He is alert and oriented to person, place, and time.  Skin: Skin is warm and dry.    Psychiatric: He has a normal mood and affect. His behavior is normal. Judgment and thought content normal.    Activities of Daily Living In your present state of health, do you have any difficulty performing the following activities: 11/23/2017 08/15/2017  Hearing? Y Y  Comment - Wears bilateral hearing aids.  Vision? N N  Difficulty concentrating or making decisions? N N  Walking or climbing stairs? N N  Dressing or bathing? N N  Doing errands, shopping? N N  Preparing Food and eating ? - N  Using the Toilet? - N  In the past six months, have you accidently leaked urine? - N  Do you have problems with loss of bowel control? - N  Managing your Medications? - N  Managing your Finances? - N  Housekeeping or managing your Housekeeping? - N  Some recent data might be hidden    Fall Risk Assessment Fall Risk  08/15/2017 08/02/2016 07/30/2015 11/10/2014  Falls in the past year? No No No No     Depression Screen PHQ 2/9 Scores 11/23/2017 08/15/2017 08/02/2016 07/30/2015  PHQ - 2 Score 0 0 0 0  PHQ- 9 Score 1 - - -    6CIT Screen 08/02/2016  What Year? 0 points  What month? 0 points  What time? 0 points  Count back from 20 0 points  Months in reverse 0 points  Repeat phrase 0 points  Total Score 0        Assessment & Plan:     Reviewed patient's Family Medical History Reviewed and updated list of patient's medical providers Assessment of cognitive impairment was done Assessed patient's functional ability Established a written schedule for health screening [place] Completed and Reviewed  Exercise Activities and Dietary recommendations Goals    . DIET - INCREASE WATER INTAKE     Recommend increasing water intake to 4 glasses a day.     . Increase water intake     Recommend increasing water intake to 4 glasses  of water a day.        Immunization History  Administered Date(s) Administered  . Influenza, High Dose Seasonal PF 11/10/2014,  11/04/2015, 10/09/2017  . Pneumococcal Conjugate-13 05/05/2014  . Pneumococcal Polysaccharide-23 08/02/2016  . Tdap 10/12/2010  . Zoster Recombinat (Shingrix) 11/10/2016, 01/26/2017    Health Maintenance  Topic Date Due  . TETANUS/TDAP  10/11/2020  . INFLUENZA VACCINE  Completed  . PNA vac Low Risk Adult  Completed     Discussed health benefits of physical activity, and encouraged him to engage in regular exercise appropriate for his age and condition.   1. Essential (primary) hypertension  - CBC with Differential/Platelet - Comprehensive metabolic panel - TSH - Lipid panel  2. Essential hypertension  - CBC with Differential/Platelet - Comprehensive metabolic panel - TSH - Lipid panel  3. Prostate cancer screening   4. Arthralgia, unspecified joint Stop atorvastatin and return to clinic 1 month.  Do not think this is from a tick bite. - CK (Creatine Kinase) - Lyme Ab/Western Blot Reflex  5. Myalgia  - CK (Creatine Kinase) - Lyme Ab/Western Blot Reflex  6. Prostate cancer ([place])  - PSA  7. Colon cancer screening - Cologuard  ------------------------------------------------------------------------------------------------------------   I have done the exam and reviewed the above chart and it is accurate to the best of my knowledge. Development worker, community has been used in this note in any air is in the dictation or transcription are unintentional.  [name], MD  [place]

## 2017-11-24 DIAGNOSIS — M2550 Pain in unspecified joint: Secondary | ICD-10-CM | POA: Diagnosis not present

## 2017-11-24 DIAGNOSIS — I10 Essential (primary) hypertension: Secondary | ICD-10-CM | POA: Diagnosis not present

## 2017-11-24 DIAGNOSIS — M791 Myalgia, unspecified site: Secondary | ICD-10-CM | POA: Diagnosis not present

## 2017-11-24 DIAGNOSIS — C61 Malignant neoplasm of prostate: Secondary | ICD-10-CM | POA: Diagnosis not present

## 2017-11-28 LAB — COMPREHENSIVE METABOLIC PANEL
A/G RATIO: 2.7 — AB (ref 1.2–2.2)
ALT: 24 IU/L (ref 0–44)
AST: 18 IU/L (ref 0–40)
Albumin: 4.1 g/dL (ref 3.5–4.7)
Alkaline Phosphatase: 72 IU/L (ref 39–117)
BILIRUBIN TOTAL: 0.6 mg/dL (ref 0.0–1.2)
BUN/Creatinine Ratio: 16 (ref 10–24)
BUN: 16 mg/dL (ref 8–27)
CALCIUM: 9.3 mg/dL (ref 8.6–10.2)
CHLORIDE: 105 mmol/L (ref 96–106)
CO2: 24 mmol/L (ref 20–29)
Creatinine, Ser: 1 mg/dL (ref 0.76–1.27)
GFR, EST AFRICAN AMERICAN: 82 mL/min/{1.73_m2} (ref >59)
GFR, EST NON AFRICAN AMERICAN: 71 mL/min/{1.73_m2} (ref >59)
GLOBULIN, TOTAL: 1.5 g/dL (ref 1.5–4.5)
Glucose: 92 mg/dL (ref 65–99)
Potassium: 3.9 mmol/L (ref 3.5–5.2)
Sodium: 145 mmol/L — ABNORMAL HIGH (ref 134–144)
TOTAL PROTEIN: 5.6 g/dL — AB (ref 6.0–8.5)

## 2017-11-28 LAB — CK: Total CK: 163 U/L (ref 24–204)

## 2017-11-28 LAB — CBC WITH DIFFERENTIAL/PLATELET
BASOS: 1 %
Basophils Absolute: 0.1 10*3/uL (ref 0.0–0.2)
EOS (ABSOLUTE): 0.5 10*3/uL — AB (ref 0.0–0.4)
Eos: 7 %
HEMATOCRIT: 40.6 % (ref 37.5–51.0)
Hemoglobin: 14.4 g/dL (ref 13.0–17.7)
IMMATURE GRANS (ABS): 0 10*3/uL (ref 0.0–0.1)
IMMATURE GRANULOCYTES: 0 %
Lymphocytes Absolute: 2.3 10*3/uL (ref 0.7–3.1)
Lymphs: 33 %
MCH: 34 pg — AB (ref 26.6–33.0)
MCHC: 35.5 g/dL (ref 31.5–35.7)
MCV: 96 fL (ref 79–97)
MONOS ABS: 0.8 10*3/uL (ref 0.1–0.9)
Monocytes: 12 %
NEUTROS ABS: 3.3 10*3/uL (ref 1.4–7.0)
NEUTROS PCT: 47 %
PLATELETS: 235 10*3/uL (ref 150–450)
RBC: 4.24 x10E6/uL (ref 4.14–5.80)
RDW: 12.7 % (ref 12.3–15.4)
WBC: 7 10*3/uL (ref 3.4–10.8)

## 2017-11-28 LAB — PSA: Prostate Specific Ag, Serum: <0.1 ng/mL (ref 0.0–4.0)

## 2017-11-28 LAB — LYME AB/WESTERN BLOT REFLEX: Lyme IgG/IgM Ab: <0.91 {ISR} (ref 0.00–0.90)

## 2017-11-28 LAB — LIPID PANEL
Chol/HDL Ratio: 4.2 ratio (ref 0.0–5.0)
Cholesterol, Total: 162 mg/dL (ref 100–199)
HDL: 39 mg/dL — AB (ref >39)
LDL Calculated: 78 mg/dL (ref 0–99)
TRIGLYCERIDES: 223 mg/dL — AB (ref 0–149)
VLDL CHOLESTEROL CAL: 45 mg/dL — AB (ref 5–40)

## 2017-11-28 LAB — TSH: TSH: 1.75 u[IU]/mL (ref 0.450–4.500)

## 2017-11-29 ENCOUNTER — Telehealth: Payer: Self-pay

## 2017-11-29 NOTE — Telephone Encounter (Signed)
Left message to call back  

## 2017-11-29 NOTE — Telephone Encounter (Signed)
-----   Message from Jerrol Banana., MD sent at 11/29/2017  9:29 AM EST ----- Labs OK. Lymes negative.

## 2017-12-01 NOTE — Telephone Encounter (Signed)
Advised  ED 

## 2017-12-15 ENCOUNTER — Telehealth: Payer: Self-pay

## 2017-12-15 NOTE — Telephone Encounter (Signed)
Patient has not heard anything or received the kit for his cologuard.

## 2017-12-18 NOTE — Telephone Encounter (Signed)
[name], could you please check on the status of this test? Thanks Jiles Garter

## 2017-12-18 NOTE — Telephone Encounter (Signed)
Orders faxed to [phone].

## 2017-12-18 NOTE — Telephone Encounter (Signed)
I do not have acces to the cologuard orders

## 2017-12-21 DIAGNOSIS — Z1211 Encounter for screening for malignant neoplasm of colon: Secondary | ICD-10-CM | POA: Diagnosis not present

## 2017-12-25 ENCOUNTER — Ambulatory Visit (INDEPENDENT_AMBULATORY_CARE_PROVIDER_SITE_OTHER): Payer: Medicare Other | Admitting: Family Medicine

## 2017-12-25 ENCOUNTER — Ambulatory Visit
Admission: RE | Admit: 2017-12-25 | Discharge: 2017-12-25 | Disposition: A | Discharge status: Home / Self Care | Payer: Medicare Other | Source: Ambulatory Visit | Attending: Family Medicine | Admitting: Family Medicine

## 2017-12-25 VITALS — BP 148/88 | HR 82 | Temp 98.1°F | Resp 16 | Wt 171.0 lb

## 2017-12-25 DIAGNOSIS — M542 Cervicalgia: Secondary | ICD-10-CM | POA: Diagnosis not present

## 2017-12-25 DIAGNOSIS — M25512 Pain in left shoulder: Secondary | ICD-10-CM

## 2017-12-25 DIAGNOSIS — M25511 Pain in right shoulder: Secondary | ICD-10-CM | POA: Insufficient documentation

## 2017-12-25 DIAGNOSIS — I10 Essential (primary) hypertension: Secondary | ICD-10-CM | POA: Diagnosis not present

## 2017-12-25 DIAGNOSIS — M25519 Pain in unspecified shoulder: Secondary | ICD-10-CM | POA: Diagnosis not present

## 2017-12-25 DIAGNOSIS — M1990 Unspecified osteoarthritis, unspecified site: Secondary | ICD-10-CM

## 2017-12-25 DIAGNOSIS — E7800 Pure hypercholesterolemia, unspecified: Secondary | ICD-10-CM | POA: Diagnosis not present

## 2017-12-25 DIAGNOSIS — M5032 Other cervical disc degeneration, mid-cervical region, unspecified level: Secondary | ICD-10-CM | POA: Diagnosis not present

## 2017-12-25 DIAGNOSIS — M19011 Primary osteoarthritis, right shoulder: Secondary | ICD-10-CM | POA: Diagnosis not present

## 2017-12-25 MED ORDER — NAPROXEN 375 MG PO TBEC: 375.0000 mg | DELAYED_RELEASE_TABLET | Freq: Two times a day (BID) | ORAL | 0 refills | Status: DC | PRN

## 2017-12-25 NOTE — Progress Notes (Signed)
Wesley Small  MRN: [id number] DOB: December 13, 1935  Subjective:  HPI   The patient is an 82 year old male who presents for follow up of myalgias and arthralgias.  Patient was advised to discontinue his Atorvastatin to see if this helped his symptoms. He reports that there has been no change in the pain.   He also relates that now he is having bilateral hand/finger pain and trigger finger.  He thinks the shoulder and hand pain may be coming from his neck.  Patient Active Problem List   Diagnosis Date Noted  . Osteoarthritis of knee 08/02/2016  . Adjustment disorder with depressed mood 10/29/2014  . Arthritis 10/29/2014  . Insomnia, persistent 10/29/2014  . Hypercholesterolemia 10/29/2014  . BP (high blood pressure) 10/29/2014  . Artificial cardiac pacemaker 10/29/2014  . Sick sinus syndrome ([place]) 11/11/2013  . 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 Acid reflux 05/22/2009  . Essential (primary) hypertension 01/13/2009    Past Medical History:  Diagnosis Date  . Arthritis   . Dysrhythmia    RBBB  . GERD (gastroesophageal reflux disease)   . History of blood transfusion   . History of diverticulitis   . History of hiatal hernia   . HOH (hard of hearing)   . Hypercholesterolemia   . Hypertension   . Presence of permanent cardiac pacemaker   . Sleep apnea     Social History   Socioeconomic History  . Marital status: Widowed    Spouse name: Not on file  . Number of children: 1  . Years of education: Not on file  . Highest education level: Some college, no degree  Occupational History  . Occupation: retired  Scientific laboratory technician  . Financial resource strain: Not hard at all  . Food insecurity:    Worry: Never true    Inability: Never true  . Transportation needs:    Medical: No    Non-medical: No  Tobacco Use  . Smoking status: Former Smoker    Types: Cigarettes  . Smokeless tobacco: Never Used  . Tobacco comment: quit around 1970  Substance and Sexual Activity  .  Alcohol use: Yes    Alcohol/week: 7.0 - 8.0 standard drinks    Types: 7 - 8 Shots of liquor per week  . Drug use: No  . Sexual activity: Not on file  Lifestyle  . Physical activity:    Days per week: Not on file    Minutes per session: Not on file  . Stress: Not at all  Relationships  . Social connections:    Talks on phone: Not on file    Gets together: Not on file    Attends religious service: Not on file    Active member of club or organization: Not on file    Attends meetings of clubs or organizations: Not on file    Relationship status: Not on file  . Intimate partner violence:    Fear of current or ex partner: Not on file    Emotionally abused: Not on file    Physically abused: Not on file    Forced sexual activity: Not on file  Other Topics Concern  . Not on file  Social History Narrative  . Not on file    Outpatient Encounter Medications as of 12/25/2017  Medication Sig  . acetaminophen (TYLENOL) 500 MG tablet Take 500 mg every 6 (six) hours as needed by mouth for moderate pain or headache.   Marland Kitchen ascorbic acid (VITAMIN C) 500 MG tablet  Take 500 mg by mouth daily.   Marland Kitchen aspirin 81 MG tablet Take 81 mg by mouth 2 (two) times daily.   . Cholecalciferol (VITAMIN D3) 2000 units TABS Take 2,000 Units by mouth daily.  . Coenzyme Q10 100 MG capsule Take 100 mg daily by mouth.   . hydrocortisone 2.5 % cream Apply topically 2 (two) times daily. (Patient taking differently: Apply topically 2 (two) times daily. As needed)  . losartan (COZAAR) 100 MG tablet TAKE 1 TABLET BY MOUTH ONCE A DAY  . Magnesium Oxide 400 MG CAPS Take 400 mg by mouth daily.   . metoprolol succinate (TOPROL-XL) 25 MG 24 hr tablet Take 25 mg daily by mouth.   . NON FORMULARY Apply 1 mL topically every other day. Testosterone 20% 90gm   . Omega-3 Fatty Acids (SALMON OIL-1000 PO) Take 1,000 mg by mouth daily.  Marland Kitchen omeprazole (PRILOSEC) 20 MG capsule Take 20 mg by mouth at bedtime  . traMADol (ULTRAM) 50 MG tablet  TAKE 1 TABLET BY MOUTH TWICE (2) DAILY AS NEEDED FOR PAIN  . vitamin B-12 (CYANOCOBALAMIN) 500 MCG tablet Take 500 mcg by mouth daily.  Marland Kitchen zolpidem (AMBIEN) 10 MG tablet TAKE 1 TABLET BY MOUTH AT BEDTIME  . atorvastatin (LIPITOR) 10 MG tablet TAKE 10 MG BY MOUTH EVERY DAY AT BEDTIME (Patient not taking: Reported on 12/25/2017)   No facility-administered encounter medications on file as of 12/25/2017.     Allergies  Allergen Reactions  . Penicillins Rash and Other (See Comments)    Has patient had a PCN reaction causing immediate rash, facial/tongue/throat swelling, SOB or lightheadedness with hypotension: Yes Has patient had a PCN reaction causing severe rash involving mucus membranes or skin necrosis: No  Has patient had a PCN reaction that required hospitalization: No Has patient had a PCN reaction occurring within the last 10 years: No If all of the above answers are "NO", then may proceed with Cephalosporin use.     Review of Systems  Constitutional: Negative.   HENT: Negative.   Eyes: Negative.   Respiratory: Negative.   Cardiovascular: Negative.   Gastrointestinal: Negative.   Musculoskeletal: Positive for joint pain, myalgias and neck pain.  Skin: Negative.   Neurological: Negative.   Psychiatric/Behavioral: Negative.     Objective:  BP (!) 148/88 (BP Location: Right Arm, Patient Position: Sitting, Cuff Size: Normal)   Pulse 82   Temp 98.1 F (36.7 C) (Oral)   Resp 16   Wt 171 lb (77.6 kg)   SpO2 99%   BMI 26.78 kg/m   Physical Exam  Constitutional: He is oriented to person, place, and time and well-developed, well-nourished, and in no distress.  HENT:  Head: Normocephalic and atraumatic.  Right Ear: External ear normal.  Left Ear: External ear normal.  Nose: Nose normal.  Mouth/Throat: Oropharynx is clear and moist.  Eyes: Conjunctivae are normal. No scleral icterus.  Neck: No thyromegaly present.  Cardiovascular: Normal rate, regular rhythm and normal heart  sounds.  Pulmonary/Chest: Effort normal and breath sounds normal.  Abdominal: Soft.  Genitourinary: Discharge found.  Musculoskeletal:        General: No edema.     Comments: No tenderness or pain with movement of the C-spine. Full range of motion in the shoulders without any impingement  Neurological: He is alert and oriented to person, place, and time. He displays normal reflexes. No cranial nerve deficit. Gait normal. GCS score is 15.  Normal strength and sensation of both upper extremities.  Skin:  Skin is warm and dry.  Psychiatric: Mood, memory, affect and judgment normal.    Assessment and Plan :  1. Neck pain nonfocal neurologic exam.  Try naproxen for 2 weeks.  Return to clinic after that - DG Cervical Spine Complete; Future - DG Shoulder Right; Future - Naproxen 375 MG TBEC; Take 1 tablet (375 mg total) by mouth 2 (two) times daily as needed.  Dispense: 60 each; Refill: 0  2. Bilateral shoulder pain, unspecified chronicity  - DG Cervical Spine Complete; Future - DG Shoulder Right; Future - Naproxen 375 MG TBEC; Take 1 tablet (375 mg total) by mouth 2 (two) times daily .as needed.  Dispense: 60 each; Refill: 0  3. Arthritis   4. Essential hypertension  5.HLD Restart Lipitor.  I have done the exam and reviewed the chart and it is accurate to the best of my knowledge. Development worker, community has been used and  any errors in dictation or transcription are unintentional. Miguel Aschoff M.D. [facility]

## 2017-12-26 ENCOUNTER — Telehealth: Payer: Self-pay

## 2017-12-26 DIAGNOSIS — R195 Other fecal abnormalities: Secondary | ICD-10-CM

## 2017-12-26 NOTE — Telephone Encounter (Signed)
Pt returned missed call.  Please call pt back. ° °Thanks, °TGH °

## 2017-12-26 NOTE — Telephone Encounter (Signed)
-----   Message from Jerrol Banana., MD sent at 12/26/2017  8:23 AM EST ----- Arthritis and DDD on xray

## 2017-12-26 NOTE — Telephone Encounter (Signed)
Left message to call back  

## 2017-12-27 LAB — COLOGUARD: Cologuard: POSITIVE

## 2017-12-27 NOTE — Telephone Encounter (Signed)
Would refer to GI

## 2017-12-27 NOTE — Telephone Encounter (Signed)
Advised patient of results. Order placed.

## 2017-12-27 NOTE — Telephone Encounter (Signed)
Also received cologuard report back and it was positive. Is further testing needed? Please advise. Thanks!

## 2017-12-29 ENCOUNTER — Telehealth: Payer: Self-pay | Admitting: Family Medicine

## 2017-12-29 NOTE — Telephone Encounter (Signed)
Pt is requesting a copy of cologuard report be left at front desk for him to pick up

## 2017-12-29 NOTE — Telephone Encounter (Signed)
Printed and left up front for pick up patient advised. KW

## 2018-01-01 ENCOUNTER — Other Ambulatory Visit: Payer: Self-pay | Admitting: Family Medicine

## 2018-01-01 ENCOUNTER — Encounter: Payer: Self-pay | Admitting: Gastroenterology

## 2018-01-01 DIAGNOSIS — G4700 Insomnia, unspecified: Secondary | ICD-10-CM

## 2018-01-09 DIAGNOSIS — D3131 Benign neoplasm of right choroid: Secondary | ICD-10-CM | POA: Diagnosis not present

## 2018-01-09 DIAGNOSIS — H43813 Vitreous degeneration, bilateral: Secondary | ICD-10-CM | POA: Diagnosis not present

## 2018-01-22 ENCOUNTER — Other Ambulatory Visit: Payer: Self-pay | Admitting: Family Medicine

## 2018-02-07 DIAGNOSIS — D1800 Hemangioma unspecified site: Secondary | ICD-10-CM | POA: Diagnosis not present

## 2018-02-07 DIAGNOSIS — L812 Freckles: Secondary | ICD-10-CM | POA: Diagnosis not present

## 2018-02-07 DIAGNOSIS — L821 Other seborrheic keratosis: Secondary | ICD-10-CM | POA: Diagnosis not present

## 2018-02-07 DIAGNOSIS — Z1283 Encounter for screening for malignant neoplasm of skin: Secondary | ICD-10-CM | POA: Diagnosis not present

## 2018-02-07 DIAGNOSIS — L570 Actinic keratosis: Secondary | ICD-10-CM | POA: Diagnosis not present

## 2018-02-07 DIAGNOSIS — L578 Other skin changes due to chronic exposure to nonionizing radiation: Secondary | ICD-10-CM | POA: Diagnosis not present

## 2018-02-07 DIAGNOSIS — D229 Melanocytic nevi, unspecified: Secondary | ICD-10-CM | POA: Diagnosis not present

## 2018-02-07 DIAGNOSIS — D485 Neoplasm of uncertain behavior of skin: Secondary | ICD-10-CM | POA: Diagnosis not present

## 2018-02-07 DIAGNOSIS — I8393 Asymptomatic varicose veins of bilateral lower extremities: Secondary | ICD-10-CM | POA: Diagnosis not present

## 2018-02-27 ENCOUNTER — Encounter: Payer: Self-pay | Admitting: Gastroenterology

## 2018-02-27 ENCOUNTER — Ambulatory Visit (INDEPENDENT_AMBULATORY_CARE_PROVIDER_SITE_OTHER): Payer: Medicare Other | Admitting: Gastroenterology

## 2018-02-27 VITALS — BP 181/81 | HR 87 | Ht 67.0 in | Wt 171.2 lb

## 2018-02-27 DIAGNOSIS — R195 Other fecal abnormalities: Secondary | ICD-10-CM

## 2018-02-27 NOTE — Progress Notes (Signed)
Gastroenterology Consultation  Referring Provider:     Jerrol Banana.,* Primary Care Physician:  Jerrol Banana., MD Primary Gastroenterologist:  Dr. Allen Norris     Reason for Consultation:     Positive Cologuard        HPI:   Wesley Small is a 83 y.o. y/o male referred for consultation & management of positive Cologuard by Dr. Rosanna Randy, Retia Passe., MD.  This patient comes in today after being found to have a positive Cologuard.  He reports his last colonoscopy was approximately 10 years ago by Dr. Candace Cruise.  The patient also reports that he does home testing with a store-bought kit twice a year to check if he has any blood in the stool and states he has not had a positive test ever.  He now states that he was sent a Cologuard test and was notified that it was positive.  The patient wanted discuss the implications of this test and what my recommendations would be for the positive test.  He denies any black stools or bloody stools.  He denies any nausea vomiting fevers chills or change in bowel habits.  There is also no family history of colon cancer or colon polyps.  Past Medical History:  Diagnosis Date  . Arthritis   . Dysrhythmia    RBBB  . GERD (gastroesophageal reflux disease)   . History of blood transfusion   . History of diverticulitis   . History of hiatal hernia   . HOH (hard of hearing)   . Hypercholesterolemia   . Hypertension   . Presence of permanent cardiac pacemaker   . Sleep apnea     Past Surgical History:  Procedure Laterality Date  . CATARACT EXTRACTION W/PHACO Right 11/24/2016   Procedure: CATARACT EXTRACTION PHACO AND INTRAOCULAR LENS PLACEMENT (IOC);  Surgeon: Eulogio Bear, MD;  Location: [facility] ORS;  Service: Ophthalmology;  Laterality: Right;  Lot # [id number] [place]:   00.31  AP%: 10.9 CDE:  3.39  . CATARACT EXTRACTION W/PHACO Left 12/29/2016   Procedure: CATARACT EXTRACTION PHACO AND INTRAOCULAR LENS PLACEMENT (IOC);  Surgeon: Eulogio Bear, MD;  Location: [facility] ORS;  Service: Ophthalmology;  Laterality: Left;  [place] 00:33.8 AP% 8.5 CDE 2.86 Fluid Pack lot # [id number]  . INJECTION KNEE    . PACEMAKER INSERTION    . SKIN GRAFT    . TONSILLECTOMY AND ADENOIDECTOMY    . TRANSURETHRAL RESECTION OF PROSTATE      Prior to Admission medications   Medication Sig Start Date End Date Taking? Authorizing Provider  acetaminophen (TYLENOL) 500 MG tablet Take 500 mg every 6 (six) hours as needed by mouth for moderate pain or headache.    Yes [provider]  ascorbic acid (VITAMIN C) 500 MG tablet Take 500 mg by mouth daily.  02/15/09  Yes [provider]  aspirin 81 MG tablet Take 81 mg by mouth 2 (two) times daily.  12/24/07  Yes [provider]  atorvastatin (LIPITOR) 10 MG tablet TAKE 10 MG BY MOUTH EVERY DAY AT BEDTIME 10/24/17  Yes Jerrol Banana., MD  Cholecalciferol (VITAMIN D3) 2000 units TABS Take 2,000 Units by mouth daily.   Yes [provider]  Coenzyme Q10 100 MG capsule Take 100 mg daily by mouth.    Yes [provider]  losartan (COZAAR) 100 MG tablet TAKE 1 TABLET BY MOUTH ONCE DAILY 01/23/18  Yes Jerrol Banana., MD  Magnesium Oxide 400  MG CAPS Take 400 mg by mouth daily.  02/15/09  Yes [provider]  metoprolol succinate (TOPROL-XL) 25 MG 24 hr tablet Take 25 mg daily by mouth.  04/19/11  Yes [provider]  NON FORMULARY Apply 1 mL topically every other day. Testosterone 20% 90gm    Yes [provider]  Omega-3 Fatty Acids (SALMON OIL-1000 PO) Take 1,000 mg by mouth daily.   Yes [provider]  omeprazole (PRILOSEC) 20 MG capsule Take 20 mg by mouth at bedtime 12/24/07  Yes [provider]  traMADol (ULTRAM) 50 MG tablet TAKE 1 TABLET BY MOUTH TWICE (2) DAILY AS NEEDED FOR PAIN 10/09/17  Yes Jerrol Banana., MD  vitamin B-12 (CYANOCOBALAMIN) 500 MCG tablet Take 500 mcg by mouth daily.   Yes [provider]    zolpidem (AMBIEN) 10 MG tablet TAKE 1 TABLET BY MOUTH EVERY NIGHT AT BEDTIME 01/01/18  Yes Jerrol Banana., MD  hydrocortisone 2.5 % cream Apply topically 2 (two) times daily. Patient not taking: Reported on 02/27/2018 08/02/16   Jerrol Banana., MD  Naproxen 375 MG TBEC Take 1 tablet (375 mg total) by mouth 2 (two) times daily as needed. Patient not taking: Reported on 02/27/2018 12/25/17   Jerrol Banana., MD    Family History  Problem Relation Age of Onset  . Heart disease Father      Social History   Tobacco Use  . Smoking status: Former Smoker    Types: Cigarettes  . Smokeless tobacco: Never Used  . Tobacco comment: quit around 1970  Substance Use Topics  . Alcohol use: Yes    Alcohol/week: 7.0 - 8.0 standard drinks    Types: 7 - 8 Shots of liquor per week  . Drug use: No    Allergies as of 02/27/2018 - Review Complete 02/27/2018  Allergen Reaction Noted  . Penicillins Rash and Other (See Comments) 10/29/2014    Review of Systems:    All systems reviewed and negative except where noted in HPI.   Physical Exam:  BP (!) 181/81   Pulse 87   Ht '5\' 7"'$  (1.702 m)   Wt 171 lb 3.2 oz (77.7 kg)   BMI 26.81 kg/m  No LMP for male patient. General:   Alert,  Well-developed, well-nourished, pleasant and cooperative in NAD Head:  Normocephalic and atraumatic. Eyes:  Sclera clear, no icterus.   Conjunctiva pink. Ears:  Normal auditory acuity. Nose:  No deformity, discharge, or lesions. Mouth:  No deformity or lesions,oropharynx pink & moist. Neck:  Supple; no masses or thyromegaly. Lungs:  Respirations even and unlabored.  Clear throughout to auscultation.   No wheezes, crackles, or rhonchi. No acute distress. Heart:  Regular rate and rhythm; no murmurs, clicks, rubs, or gallops. Abdomen:  Normal bowel sounds.  No bruits.  Soft, non-tender and non-distended without masses, hepatosplenomegaly or hernias noted.  No guarding or rebound tenderness.  Negative  Carnett sign.   Rectal:  Deferred.  Msk:  Symmetrical without gross deformities.  Good, equal movement & strength bilaterally. Pulses:  Normal pulses noted. Extremities:  No clubbing or edema.  No cyanosis. Neurologic:  Alert and oriented x3;  grossly normal neurologically. Skin:  Intact without significant lesions or rashes.  No jaundice. Lymph Nodes:  No significant cervical adenopathy. Psych:  Alert and cooperative. Normal mood and affect.  Imaging Studies: No results found.  Assessment and Plan:   Wesley Small is a 83 y.o. y/o male  who comes in today with a history of a positive Cologuard test.  The patient has not had a colonoscopy in 10 years.  There is no history of polyps in him or his first-degree relatives and he denies any family history of colon cancer or personal history of colon cancer.  The patient has been told that these test can be positive for adenomatous polyps, blood in the stool or colon cancer.  The patient has been recommended to undergo a colonoscopy for his positive Cologuard test.  The patient states that he would like to think about it and has taken my card and has told his that he will notify us when and if he decides to undergo a colonoscopy.  The patient has been explained the plan and agrees with it.  Lucilla Lame, MD. [facility]    Note: This dictation was prepared with Dragon dictation along with smaller phrase technology. Any transcriptional errors that result from this process are unintentional.

## 2018-03-06 ENCOUNTER — Other Ambulatory Visit: Payer: Self-pay | Admitting: Family Medicine

## 2018-03-06 DIAGNOSIS — M65332 Trigger finger, left middle finger: Secondary | ICD-10-CM | POA: Diagnosis not present

## 2018-03-06 DIAGNOSIS — M180 Bilateral primary osteoarthritis of first carpometacarpal joints: Secondary | ICD-10-CM | POA: Diagnosis not present

## 2018-03-06 DIAGNOSIS — M65331 Trigger finger, right middle finger: Secondary | ICD-10-CM | POA: Diagnosis not present

## 2018-04-23 ENCOUNTER — Other Ambulatory Visit: Payer: Self-pay | Admitting: Family Medicine

## 2018-04-23 MED ORDER — LOSARTAN POTASSIUM 100 MG PO TABS: 100.0000 mg | ORAL_TABLET | Freq: Every day | ORAL | 1 refills | Status: DC

## 2018-04-23 NOTE — Telephone Encounter (Signed)
[facility] faxed refill request for the following medications:  losartan (COZAAR) 100 MG tablet  90 day supply  Pharmacy requested 90 day supply because of pt's insurance. Please advise. Thanks TNP

## 2018-05-15 DIAGNOSIS — E782 Mixed hyperlipidemia: Secondary | ICD-10-CM | POA: Diagnosis not present

## 2018-05-15 DIAGNOSIS — Z8249 Family history of ischemic heart disease and other diseases of the circulatory system: Secondary | ICD-10-CM | POA: Diagnosis not present

## 2018-05-15 DIAGNOSIS — I10 Essential (primary) hypertension: Secondary | ICD-10-CM | POA: Diagnosis not present

## 2018-05-15 DIAGNOSIS — I495 Sick sinus syndrome: Secondary | ICD-10-CM | POA: Diagnosis not present

## 2018-05-23 ENCOUNTER — Other Ambulatory Visit: Payer: Self-pay | Admitting: Family Medicine

## 2018-05-23 DIAGNOSIS — G4700 Insomnia, unspecified: Secondary | ICD-10-CM

## 2018-05-23 NOTE — Telephone Encounter (Signed)
Please Review

## 2018-05-29 DIAGNOSIS — I714 Abdominal aortic aneurysm, without rupture: Secondary | ICD-10-CM | POA: Diagnosis not present

## 2018-05-29 DIAGNOSIS — I739 Peripheral vascular disease, unspecified: Secondary | ICD-10-CM | POA: Diagnosis not present

## 2018-05-29 DIAGNOSIS — Z8249 Family history of ischemic heart disease and other diseases of the circulatory system: Secondary | ICD-10-CM | POA: Diagnosis not present

## 2018-05-29 DIAGNOSIS — I495 Sick sinus syndrome: Secondary | ICD-10-CM | POA: Diagnosis not present

## 2018-06-06 DIAGNOSIS — I495 Sick sinus syndrome: Secondary | ICD-10-CM | POA: Diagnosis not present

## 2018-06-12 DIAGNOSIS — M545 Low back pain: Secondary | ICD-10-CM | POA: Diagnosis not present

## 2018-06-12 DIAGNOSIS — M48061 Spinal stenosis, lumbar region without neurogenic claudication: Secondary | ICD-10-CM | POA: Diagnosis not present

## 2018-06-12 DIAGNOSIS — M25561 Pain in right knee: Secondary | ICD-10-CM | POA: Diagnosis not present

## 2018-06-26 DIAGNOSIS — M5416 Radiculopathy, lumbar region: Secondary | ICD-10-CM | POA: Diagnosis not present

## 2018-07-12 DIAGNOSIS — M25561 Pain in right knee: Secondary | ICD-10-CM | POA: Diagnosis not present

## 2018-07-17 DIAGNOSIS — M65311 Trigger thumb, right thumb: Secondary | ICD-10-CM | POA: Diagnosis not present

## 2018-08-03 ENCOUNTER — Other Ambulatory Visit: Payer: Self-pay | Admitting: Family Medicine

## 2018-08-17 ENCOUNTER — Ambulatory Visit: Payer: Medicare Other

## 2018-08-21 ENCOUNTER — Encounter: Payer: Self-pay | Admitting: Family Medicine

## 2018-08-21 ENCOUNTER — Other Ambulatory Visit: Payer: Self-pay

## 2018-08-21 ENCOUNTER — Ambulatory Visit (INDEPENDENT_AMBULATORY_CARE_PROVIDER_SITE_OTHER): Payer: Medicare Other | Admitting: Family Medicine

## 2018-08-21 VITALS — BP 136/72 | HR 68 | Temp 98.3°F | Resp 16 | Ht 67.0 in | Wt 163.0 lb

## 2018-08-21 DIAGNOSIS — I889 Nonspecific lymphadenitis, unspecified: Secondary | ICD-10-CM | POA: Diagnosis not present

## 2018-08-21 DIAGNOSIS — H9202 Otalgia, left ear: Secondary | ICD-10-CM

## 2018-08-21 MED ORDER — CEPHALEXIN 500 MG PO CAPS: 500.0000 mg | ORAL_CAPSULE | Freq: Three times a day (TID) | ORAL | 0 refills | Status: DC

## 2018-08-21 NOTE — Progress Notes (Signed)
Patient: Wesley Small Male    DOB: 1935-04-22   83 y.o.   MRN: [id number] Visit Date: 08/21/2018  Today's Provider: [name], MD   Chief Complaint  Patient presents with  . Ear Pain   Subjective:     HPI  Swelling behind left ear for about 1 week. He reports that this has progressively gotten worse. He describes it as "pressure" behind his left ear. He has not taken anything OTC for symptoms.  BP Readings from Last 3 Encounters:  08/21/18 136/72  02/27/18 (!) 181/81  12/25/17 (!) 148/88   Wt Readings from Last 3 Encounters:  08/21/18 163 lb (73.9 kg)  02/27/18 171 lb 3.2 oz (77.7 kg)  12/25/17 171 lb (77.6 kg)     Allergies  Allergen Reactions  . Penicillins Rash and Other (See Comments)    Has patient had a PCN reaction causing immediate rash, facial/tongue/throat swelling, SOB or lightheadedness with hypotension: Yes Has patient had a PCN reaction causing severe rash involving mucus membranes or skin necrosis: No  Has patient had a PCN reaction that required hospitalization: No Has patient had a PCN reaction occurring within the last 10 years: No If all of the above answers are "NO", then may proceed with Cephalosporin use.      Current Outpatient Medications:  .  acetaminophen (TYLENOL) 500 MG tablet, Take 500 mg every 6 (six) hours as needed by mouth for moderate pain or headache. , Disp: , Rfl:  .  ascorbic acid (VITAMIN C) 500 MG tablet, Take 500 mg by mouth daily. , Disp: , Rfl:  .  aspirin 81 MG tablet, Take 81 mg by mouth 2 (two) times daily. , Disp: , Rfl:  .  atorvastatin (LIPITOR) 10 MG tablet, TAKE 10 MG BY MOUTH EVERY DAY AT BEDTIME, Disp: 90 tablet, Rfl: 3 .  Cholecalciferol (VITAMIN D3) 2000 units TABS, Take 2,000 Units by mouth daily., Disp: , Rfl:  .  Coenzyme Q10 100 MG capsule, Take 100 mg daily by mouth. , Disp: , Rfl:  .  losartan (COZAAR) 100 MG tablet, Take 1 tablet (100 mg total) by mouth daily., Disp: 90 tablet, Rfl: 1 .   Magnesium Oxide 400 MG CAPS, Take 400 mg by mouth daily. , Disp: , Rfl:  .  metoprolol succinate (TOPROL-XL) 25 MG 24 hr tablet, Take 25 mg daily by mouth. , Disp: , Rfl:  .  NON FORMULARY, Apply 1 mL topically every other day. Testosterone 20% 90gm , Disp: , Rfl:  .  Omega-3 Fatty Acids (SALMON OIL-1000 PO), Take 1,000 mg by mouth daily., Disp: , Rfl:  .  omeprazole (PRILOSEC) 20 MG capsule, Take 20 mg by mouth at bedtime, Disp: , Rfl:  .  traMADol (ULTRAM) 50 MG tablet, TAKE 1 TABLET BY MOUTH TWICE A DAY AS NEEDED FOR PAIN, Disp: 60 tablet, Rfl: 4 .  vitamin B-12 (CYANOCOBALAMIN) 500 MCG tablet, Take 500 mcg by mouth daily., Disp: , Rfl:  .  zolpidem (AMBIEN) 10 MG tablet, TAKE 1 TABLET BY MOUTH EVERY NIGHT AT BEDTIME, Disp: 30 tablet, Rfl: 4 .  hydrocortisone 2.5 % cream, Apply topically 2 (two) times daily. (Patient not taking: Reported on 02/27/2018), Disp: 30 g, Rfl: 3 .  Naproxen 375 MG TBEC, Take 1 tablet (375 mg total) by mouth 2 (two) times daily as needed. (Patient not taking: Reported on 02/27/2018), Disp: 60 each, Rfl: 0  Review of Systems  Constitutional: Negative for activity change  and fatigue.  HENT: Positive for ear pain. Negative for congestion, sinus pressure, sore throat and tinnitus.   Respiratory: Negative for cough and shortness of breath.   Cardiovascular: Negative for chest pain, palpitations and leg swelling.  Musculoskeletal: Negative for arthralgias, myalgias, neck pain and neck stiffness.  Allergic/Immunologic: Negative for environmental allergies.  Neurological: Negative for dizziness, light-headedness and headaches.    Social History   Tobacco Use  . Smoking status: Former Smoker    Types: Cigarettes  . Smokeless tobacco: Never Used  . Tobacco comment: quit around 1970  Substance Use Topics  . Alcohol use: Yes    Alcohol/week: 7.0 - 8.0 standard drinks    Types: 7 - 8 Shots of liquor per week      Objective:   BP 136/72   Pulse 68   Temp 98.3 F  (36.8 C)   Resp 16   Ht 5\' 7"  (1.702 m)   Wt 163 lb (73.9 kg)   SpO2 99%   BMI 25.53 kg/m  Vitals:   08/21/18 1558  BP: 136/72  Pulse: 68  Resp: 16  Temp: 98.3 F (36.8 C)  SpO2: 99%  Weight: 163 lb (73.9 kg)  Height: 5\' 7"  (1.702 m)     Physical Exam Constitutional:      Appearance: Normal appearance.  HENT:     Head: Normocephalic and atraumatic.     Right Ear: Tympanic membrane and external ear normal.     Left Ear: Tympanic membrane and external ear normal.     Ears:     Comments: Mild swelling just anterior to left tragus with mild tenderness.    Mouth/Throat:     Pharynx: Oropharynx is clear.  Eyes:     General: No scleral icterus.    Conjunctiva/sclera: Conjunctivae normal.  Cardiovascular:     Rate and Rhythm: Regular rhythm.     Heart sounds: Normal heart sounds.  Pulmonary:     Effort: Pulmonary effort is normal.     Breath sounds: Normal breath sounds.  Lymphadenopathy:     Cervical: No cervical adenopathy.  Skin:    General: Skin is warm and dry.  Neurological:     General: No focal deficit present.     Mental Status: He is alert and oriented to person, place, and time.  Psychiatric:        Mood and Affect: Mood normal.        Behavior: Behavior normal.        Thought Content: Thought content normal.        Judgment: Judgment normal.      No results found for any visits on 08/21/18.     Assessment & Plan    1. Lymphadenitis Preauricular swelling-- Rx with Keflex and refer to ENT. - Ambulatory referral to ENT  2. Ear pain, left  3.Chronic Back Pain Injections per [facility].      Cranford Mon, MD  [facility]

## 2018-09-06 NOTE — Progress Notes (Signed)
Duplicate/error

## 2018-09-10 ENCOUNTER — Ambulatory Visit (INDEPENDENT_AMBULATORY_CARE_PROVIDER_SITE_OTHER): Payer: Medicare Other

## 2018-09-10 ENCOUNTER — Other Ambulatory Visit: Payer: Self-pay

## 2018-09-10 DIAGNOSIS — Z0000 Encounter for general adult medical examination without abnormal findings: Secondary | ICD-10-CM

## 2018-09-10 NOTE — Patient Instructions (Signed)
Wesley Small , Thank you for taking time to come for your Medicare Wellness Visit. I appreciate your ongoing commitment to your health goals. Please review the following plan we discussed and let me know if I can assist you in the future.   Screening recommendations/referrals: Colonoscopy: No longer required.  Recommended yearly ophthalmology/optometry visit for glaucoma screening and checkup Recommended yearly dental visit for hygiene and checkup  Vaccinations: Influenza vaccine: Currently due.  Pneumococcal vaccine: Completed series Tdap vaccine: Up to date, due 10/2020 Shingles vaccine: Completed series    Advanced directives: Please bring a copy of your POA (Power of Attorney) and/or Living Will to your next appointment.   Conditions/risks identified: Continue to work towards increasing water intake to 6-8 8 oz glasses a day.   Next appointment: 11/08/18 @ 9:40 AM with Dr Rosanna Randy.  Preventive Care 23 Years and Older, Male Preventive care refers to lifestyle choices and visits with your health care provider that can promote health and wellness. What does preventive care include?  A yearly physical exam. This is also called an annual well check.  Dental exams once or twice a year.  Routine eye exams. Ask your health care provider how often you should have your eyes checked.  Personal lifestyle choices, including:  Daily care of your teeth and gums.  Regular physical activity.  Eating a healthy diet.  Avoiding tobacco and drug use.  Limiting alcohol use.  Practicing safe sex.  Taking low doses of aspirin every day.  Taking vitamin and mineral supplements as recommended by your health care provider. What happens during an annual well check? The services and screenings done by your health care provider during your annual well check will depend on your age, overall health, lifestyle risk factors, and family history of disease. Counseling  Your health care provider may  ask you questions about your:  Alcohol use.  Tobacco use.  Drug use.  Emotional well-being.  Home and relationship well-being.  Sexual activity.  Eating habits.  History of falls.  Memory and ability to understand (cognition).  Work and work Statistician. Screening  You may have the following tests or measurements:  Height, weight, and BMI.  Blood pressure.  Lipid and cholesterol levels. These may be checked every 5 years, or more frequently if you are over 33 years old.  Skin check.  Lung cancer screening. You may have this screening every year starting at age 68 if you have a 30-pack-year history of smoking and currently smoke or have quit within the past 15 years.  Fecal occult blood test (FOBT) of the stool. You may have this test every year starting at age 45.  Flexible sigmoidoscopy or colonoscopy. You may have a sigmoidoscopy every 5 years or a colonoscopy every 10 years starting at age 55.  Prostate cancer screening. Recommendations will vary depending on your family history and other risks.  Hepatitis C blood test.  Hepatitis B blood test.  Sexually transmitted disease (STD) testing.  Diabetes screening. This is done by checking your blood sugar (glucose) after you have not eaten for a while (fasting). You may have this done every 1-3 years.  Abdominal aortic aneurysm (AAA) screening. You may need this if you are a current or former smoker.  Osteoporosis. You may be screened starting at age 73 if you are at high risk. Talk with your health care provider about your test results, treatment options, and if necessary, the need for more tests. Vaccines  Your health care provider may recommend  certain vaccines, such as:  Influenza vaccine. This is recommended every year.  Tetanus, diphtheria, and acellular pertussis (Tdap, Td) vaccine. You may need a Td booster every 10 years.  Zoster vaccine. You may need this after age 9.  Pneumococcal 13-valent  conjugate (PCV13) vaccine. One dose is recommended after age 81.  Pneumococcal polysaccharide (PPSV23) vaccine. One dose is recommended after age 45. Talk to your health care provider about which screenings and vaccines you need and how often you need them. This information is not intended to replace advice given to you by your health care provider. Make sure you discuss any questions you have with your health care provider. Document Released: 01/23/2015 Document Revised: 09/16/2015 Document Reviewed: 10/28/2014 Elsevier Interactive Patient Education  2017 [place] Prevention in the Home Falls can cause injuries. They can happen to people of all ages. There are many things you can do to make your home safe and to help prevent falls. What can I do on the outside of my home?  Regularly fix the edges of walkways and driveways and fix any cracks.  Remove anything that might make you trip as you walk through a door, such as a raised step or threshold.  Trim any bushes or trees on the path to your home.  Use bright outdoor lighting.  Clear any walking paths of anything that might make someone trip, such as rocks or tools.  Regularly check to see if handrails are loose or broken. Make sure that both sides of any steps have handrails.  Any raised decks and porches should have guardrails on the edges.  Have any leaves, snow, or ice cleared regularly.  Use sand or salt on walking paths during winter.  Clean up any spills in your garage right away. This includes oil or grease spills. What can I do in the bathroom?  Use night lights.  Install grab bars by the toilet and in the tub and shower. Do not use towel bars as grab bars.  Use non-skid mats or decals in the tub or shower.  If you need to sit down in the shower, use a plastic, non-slip stool.  Keep the floor dry. Clean up any water that spills on the floor as soon as it happens.  Remove soap buildup in the tub or  shower regularly.  Attach bath mats securely with double-sided non-slip rug tape.  Do not have throw rugs and other things on the floor that can make you trip. What can I do in the bedroom?  Use night lights.  Make sure that you have a light by your bed that is easy to reach.  Do not use any sheets or blankets that are too big for your bed. They should not hang down onto the floor.  Have a firm chair that has side arms. You can use this for support while you get dressed.  Do not have throw rugs and other things on the floor that can make you trip. What can I do in the kitchen?  Clean up any spills right away.  Avoid walking on wet floors.  Keep items that you use a lot in easy-to-reach places.  If you need to reach something above you, use a strong step stool that has a grab bar.  Keep electrical cords out of the way.  Do not use floor polish or wax that makes floors slippery. If you must use wax, use non-skid floor wax.  Do not have throw rugs and other  things on the floor that can make you trip. What can I do with my stairs?  Do not leave any items on the stairs.  Make sure that there are handrails on both sides of the stairs and use them. Fix handrails that are broken or loose. Make sure that handrails are as long as the stairways.  Check any carpeting to make sure that it is firmly attached to the stairs. Fix any carpet that is loose or worn.  Avoid having throw rugs at the top or bottom of the stairs. If you do have throw rugs, attach them to the floor with carpet tape.  Make sure that you have a light switch at the top of the stairs and the bottom of the stairs. If you do not have them, ask someone to add them for you. What else can I do to help prevent falls?  Wear shoes that:  Do not have high heels.  Have rubber bottoms.  Are comfortable and fit you well.  Are closed at the toe. Do not wear sandals.  If you use a stepladder:  Make sure that it is fully  opened. Do not climb a closed stepladder.  Make sure that both sides of the stepladder are locked into place.  Ask someone to hold it for you, if possible.  Clearly mark and make sure that you can see:  Any grab bars or handrails.  First and last steps.  Where the edge of each step is.  Use tools that help you move around (mobility aids) if they are needed. These include:  Canes.  Walkers.  Scooters.  Crutches.  Turn on the lights when you go into a dark area. Replace any light bulbs as soon as they burn out.  Set up your furniture so you have a clear path. Avoid moving your furniture around.  If any of your floors are uneven, fix them.  If there are any pets around you, be aware of where they are.  Review your medicines with your doctor. Some medicines can make you feel dizzy. This can increase your chance of falling. Ask your doctor what other things that you can do to help prevent falls. This information is not intended to replace advice given to you by your health care provider. Make sure you discuss any questions you have with your health care provider. Document Released: 10/23/2008 Document Revised: 06/04/2015 Document Reviewed: 01/31/2014 Elsevier Interactive Patient Education  2017 [employer].

## 2018-09-10 NOTE — Progress Notes (Signed)
Subjective:   Wesley Small is a 83 y.o. male who presents for Medicare Annual/Subsequent preventive examination.    This visit is being conducted through telemedicine due to the COVID-19 pandemic. This patient has given me verbal consent via doximity to conduct this visit, patient states they are participating from their home address. Some vital signs may be absent or patient reported.    Patient identification: identified by name, DOB, and current address  Review of Systems:  N/A  Cardiac Risk Factors include: advanced age (>68men, >27 women);dyslipidemia;hypertension;male gender;smoking/ tobacco exposure     Objective:    Vitals: There were no vitals taken for this visit.  There is no height or weight on file to calculate BMI. Unable to obtain vitals due to visit being conducted via telephonically.   Advanced Directives 09/10/2018 08/15/2017 12/29/2016 11/24/2016 08/02/2016  Does Patient Have a Medical Advance Directive? Yes Yes Yes No;Yes Yes  Type of Paramedic of [place];Living will [place];Living will [place];Living will [place];Living will Living will;Healthcare Power of Attorney  Does patient want to make changes to medical advance directive? - - No - Patient declined - -  Copy of [place] in Chart? No - copy requested No - copy requested Yes - No - copy requested  Would patient like information on creating a medical advance directive? - - - No - Patient declined -    Tobacco Social History   Tobacco Use  Smoking Status Former Smoker  . Types: Cigarettes  Smokeless Tobacco Never Used  Tobacco Comment   quit around 1970     Counseling given: Not Answered Comment: quit around 1970   Clinical Intake:  Pre-visit preparation completed: Yes  Pain : No/denies pain Pain Score: 0-No pain     Nutritional Risks: None Diabetes: No  How often do you need to  have someone help you when you read instructions, pamphlets, or other written materials from your doctor or pharmacy?: 1 - Never  Interpreter Needed?: No  Information entered by :: [facility], LPN  Past Medical History:  Diagnosis Date  . Arthritis   . Dysrhythmia    RBBB  . GERD (gastroesophageal reflux disease)   . History of blood transfusion   . History of diverticulitis   . History of hiatal hernia   . HOH (hard of hearing)   . Hypercholesterolemia   . Hypertension   . Presence of permanent cardiac pacemaker   . Sleep apnea    Past Surgical History:  Procedure Laterality Date  . back injection    . CATARACT EXTRACTION W/PHACO Right 11/24/2016   Procedure: CATARACT EXTRACTION PHACO AND INTRAOCULAR LENS PLACEMENT (IOC);  Surgeon: Eulogio Bear, MD;  Location: [facility] ORS;  Service: Ophthalmology;  Laterality: Right;  Lot # [id number] [place]:   00.31  AP%: 10.9 CDE:  3.39  . CATARACT EXTRACTION W/PHACO Left 12/29/2016   Procedure: CATARACT EXTRACTION PHACO AND INTRAOCULAR LENS PLACEMENT (IOC);  Surgeon: Eulogio Bear, MD;  Location: [facility] ORS;  Service: Ophthalmology;  Laterality: Left;  [place] 00:33.8 AP% 8.5 CDE 2.86 Fluid Pack lot # [id number]  . INJECTION KNEE    . INJECTION KNEE    . PACEMAKER INSERTION    . SKIN GRAFT    . TONSILLECTOMY AND ADENOIDECTOMY    . TRANSURETHRAL RESECTION OF PROSTATE     Family History  Problem Relation Age of Onset  . Heart disease Father    Social  History   Socioeconomic History  . Marital status: Significant Other    Spouse name: Not on file  . Number of children: 1  . Years of education: Not on file  . Highest education level: Some college, no degree  Occupational History  . Occupation: retired  Scientific laboratory technician  . Financial resource strain: Not hard at all  . Food insecurity    Worry: Never true    Inability: Never true  . Transportation needs    Medical: No    Non-medical: No  Tobacco Use  . Smoking status: Former Smoker     Types: Cigarettes  . Smokeless tobacco: Never Used  . Tobacco comment: quit around 1970  Substance and Sexual Activity  . Alcohol use: Yes    Alcohol/week: 7.0 - 14.0 standard drinks    Types: 7 Shots of liquor per week  . Drug use: No  . Sexual activity: Not on file  Lifestyle  . Physical activity    Days per week: 0 days    Minutes per session: 0 min  . Stress: Not at all  Relationships  . Social Herbalist on phone: Patient refused    Gets together: Patient refused    Attends religious service: Patient refused    Active member of club or organization: Patient refused    Attends meetings of clubs or organizations: Patient refused    Relationship status: Patient refused  Other Topics Concern  . Not on file  Social History Narrative  . Not on file    Outpatient Encounter Medications as of 09/10/2018  Medication Sig  . acetaminophen (TYLENOL) 500 MG tablet Take 500 mg every 6 (six) hours as needed by mouth for moderate pain or headache.   Marland Kitchen ascorbic acid (VITAMIN C) 500 MG tablet Take 500 mg by mouth daily.   Marland Kitchen aspirin 81 MG tablet Take 81 mg by mouth 2 (two) times daily.   Marland Kitchen atorvastatin (LIPITOR) 10 MG tablet TAKE 10 MG BY MOUTH EVERY DAY AT BEDTIME  . Cholecalciferol (VITAMIN D3) 2000 units TABS Take 2,000 Units by mouth daily.  . Coenzyme Q10 100 MG capsule Take 100 mg daily by mouth.   . losartan (COZAAR) 100 MG tablet Take 1 tablet (100 mg total) by mouth daily.  . Magnesium Oxide 400 MG CAPS Take 400 mg by mouth daily.   . metoprolol succinate (TOPROL-XL) 25 MG 24 hr tablet Take 25 mg daily by mouth.   . Omega-3 Fatty Acids (SALMON OIL-1000 PO) Take 1,000 mg by mouth daily.  Marland Kitchen omeprazole (PRILOSEC) 20 MG capsule Take 20 mg by mouth at bedtime  . traMADol (ULTRAM) 50 MG tablet TAKE 1 TABLET BY MOUTH TWICE A DAY AS NEEDED FOR PAIN  . vitamin B-12 (CYANOCOBALAMIN) 500 MCG tablet Take 500 mcg by mouth daily.  Marland Kitchen zolpidem (AMBIEN) 10 MG tablet TAKE 1 TABLET BY  MOUTH EVERY NIGHT AT BEDTIME  . cephALEXin (KEFLEX) 500 MG capsule Take 1 capsule (500 mg total) by mouth 3 (three) times daily. (Patient not taking: Reported on 09/10/2018)  . hydrocortisone 2.5 % cream Apply topically 2 (two) times daily. (Patient not taking: Reported on 02/27/2018)  . Naproxen 375 MG TBEC Take 1 tablet (375 mg total) by mouth 2 (two) times daily as needed. (Patient not taking: Reported on 02/27/2018)  . NON FORMULARY Apply 1 mL topically every other day. Testosterone 20% 90gm    No facility-administered encounter medications on file as of 09/10/2018.  Activities of Daily Living In your present state of health, do you have any difficulty performing the following activities: 09/10/2018 11/23/2017  Hearing? Tempie Donning  Comment Wears bilateral hearing aids. -  Vision? N N  Difficulty concentrating or making decisions? N N  Walking or climbing stairs? N N  Dressing or bathing? N N  Doing errands, shopping? N N  Preparing Food and eating ? N -  Using the Toilet? N -  In the past six months, have you accidently leaked urine? N -  Do you have problems with loss of bowel control? N -  Managing your Medications? N -  Managing your Finances? N -  Housekeeping or managing your Housekeeping? N -  Some recent data might be hidden    Patient Care Team: Jerrol Banana., MD as PCP - General (Family Medicine) Eulogio Bear, MD as Consulting Physician (Ophthalmology) Thornton Park, MD as Referring Physician (Orthopedic Surgery) Dimmig, Marcello Moores, MD as Referring Physician (Orthopedic Surgery) Teodoro Spray, MD as Consulting Physician (Cardiology) Ralene Bathe, MD as Consulting Physician (Dermatology)   Assessment:   This is a routine wellness examination for Taj[place].  Exercise Activities and Dietary recommendations Current Exercise Habits: The patient does not participate in regular exercise at present, Exercise limited by: orthopedic condition(s)  Goals    . DIET  - INCREASE WATER INTAKE     Recommend increasing water intake to 4 glasses a day.        Fall Risk: Fall Risk  09/10/2018 08/15/2017 08/02/2016 07/30/2015 11/10/2014  Falls in the past year? 0 No No No No    FALL RISK PREVENTION PERTAINING TO THE HOME:  Any stairs in or around the home? Yes  If so, are there any without handrails? No   Home free of loose throw rugs in walkways, pet beds, electrical cords, etc? Yes  Adequate lighting in your home to reduce risk of falls? Yes   ASSISTIVE DEVICES UTILIZED TO PREVENT FALLS:  Life alert? No  Use of a cane, walker or w/c? No  Grab bars in the bathroom? No  Shower chair or bench in shower? Yes  Elevated toilet seat or a handicapped toilet? No   TIMED UP AND GO:  Was the test performed? No .    Depression Screen PHQ 2/9 Scores 09/10/2018 11/23/2017 08/15/2017 08/02/2016  PHQ - 2 Score 0 0 0 0  PHQ- 9 Score - 1 - -    Cognitive Function     6CIT Screen 09/10/2018 08/02/2016  What Year? 0 points 0 points  What month? 0 points 0 points  What time? 0 points 0 points  Count back from 20 0 points 0 points  Months in reverse 0 points 0 points  Repeat phrase 0 points 0 points  Total Score 0 0    Immunization History  Administered Date(s) Administered  . Influenza, High Dose Seasonal PF 11/10/2014, 11/04/2015, 10/09/2017  . Pneumococcal Conjugate-13 05/05/2014  . Pneumococcal Polysaccharide-23 08/02/2016  . Tdap 10/12/2010  . Zoster Recombinat (Shingrix) 11/10/2016, 01/26/2017    Qualifies for Shingles Vaccine? Completed series  Tdap: Up to date  Flu Vaccine: Due for Flu vaccine. Does the patient want to receive this vaccine today?  No .   Pneumococcal Vaccine: Completed series  Screening Tests Health Maintenance  Topic Date Due  . INFLUENZA VACCINE  08/11/2018  . TETANUS/TDAP  10/11/2020  . PNA vac Low Risk Adult  Completed   Cancer Screenings:  Colorectal Screening: No longer required.  Lung Cancer Screening:  (Low Dose CT Chest recommended if Age 20-80 years, 30 pack-year currently smoking OR have quit w/in 15years.) does not qualify.   Additional Screening:  Vision Screening: Recommended annual ophthalmology exams for early detection of glaucoma and other disorders of the eye.  Dental Screening: Recommended annual dental exams for proper oral hygiene  Community Resource Referral:  CRR required this visit?  No        Plan:  I have personally reviewed and addressed the Medicare Annual Wellness questionnaire and have noted the following in the patient's chart:  A. Medical and social history B. Use of alcohol, tobacco or illicit drugs  C. Current medications and supplements D. Functional ability and status E.  Nutritional status F.  Physical activity G. Advance directives H. List of other physicians I.  Hospitalizations, surgeries, and ER visits in previous 12 months J.  [place] such as hearing and vision if needed, cognitive and depression L. Referrals and appointments   In addition, I have reviewed and discussed with patient certain preventive protocols, quality metrics, and best practice recommendations. A written personalized care plan for preventive services as well as general preventive health recommendations were provided to patient.   Glendora Score, [place]  [id number] Nurse Health Advisor   Nurse Notes: Due for an influenza vaccine.

## 2018-10-19 DIAGNOSIS — Z23 Encounter for immunization: Secondary | ICD-10-CM | POA: Diagnosis not present

## 2018-10-22 ENCOUNTER — Other Ambulatory Visit: Payer: Self-pay | Admitting: Family Medicine

## 2018-11-06 NOTE — Progress Notes (Addendum)
Patient: Wesley Small Male    DOB: 28-Apr-1935   83 y.o.   MRN: [id number] Visit Date: 11/08/2018  Today's Provider: [name], MD   Chief Complaint  Patient presents with  . Follow Up for Annual Wellness   Subjective:    Patient had AWV with [facility] 09/10/2018.  HPI  Essential hypertension From 11/23/2017-labs ok.   Prostate cancer screening/h/o prostaste cancer From 11/23/2017-labs ok.   Arthralgia, unspecified joint From 11/24/2018-Stop atorvastatin and return to clinic 1 month.  Do not think this is from a tick bite.  Myalgia From 11/23/2017-labs ok.   HLD From 12/25/2017-Restarted Lipitor  Lymphadenitis From 08/21/2018-Rx with Keflex and refer to ENT.    Allergies  Allergen Reactions  . Penicillins Rash and Other (See Comments)    Has patient had a PCN reaction causing immediate rash, facial/tongue/throat swelling, SOB or lightheadedness with hypotension: Yes Has patient had a PCN reaction causing severe rash involving mucus membranes or skin necrosis: No  Has patient had a PCN reaction that required hospitalization: No Has patient had a PCN reaction occurring within the last 10 years: No If all of the above answers are "NO", then may proceed with Cephalosporin use.      Current Outpatient Medications:  .  acetaminophen (TYLENOL) 500 MG tablet, Take 500 mg every 6 (six) hours as needed by mouth for moderate pain or headache. , Disp: , Rfl:  .  ascorbic acid (VITAMIN C) 500 MG tablet, Take 500 mg by mouth daily. , Disp: , Rfl:  .  atorvastatin (LIPITOR) 10 MG tablet, TAKE 10 MG BY MOUTH EVERY DAY AT BEDTIME, Disp: 90 tablet, Rfl: 3 .  Cholecalciferol (VITAMIN D3) 2000 units TABS, Take 2,000 Units by mouth daily., Disp: , Rfl:  .  Coenzyme Q10 100 MG capsule, Take 100 mg daily by mouth. , Disp: , Rfl:  .  losartan (COZAAR) 100 MG tablet, TAKE 1 TABLET BY MOUTH ONCE DAILY, Disp: 90 tablet, Rfl: 1 .  Magnesium Oxide 400 MG CAPS, Take 400 mg  by mouth daily. , Disp: , Rfl:  .  metoprolol succinate (TOPROL-XL) 25 MG 24 hr tablet, Take 25 mg daily by mouth. , Disp: , Rfl:  .  NON FORMULARY, Apply 1 mL topically every other day. Testosterone 20% 90gm , Disp: , Rfl:  .  Omega-3 Fatty Acids (SALMON OIL-1000 PO), Take 1,000 mg by mouth daily., Disp: , Rfl:  .  omeprazole (PRILOSEC) 20 MG capsule, Take 20 mg by mouth at bedtime, Disp: , Rfl:  .  traMADol (ULTRAM) 50 MG tablet, TAKE 1 TABLET BY MOUTH TWICE A DAY AS NEEDED FOR PAIN, Disp: 60 tablet, Rfl: 4 .  vitamin B-12 (CYANOCOBALAMIN) 500 MCG tablet, Take 500 mcg by mouth daily., Disp: , Rfl:  .  zolpidem (AMBIEN) 10 MG tablet, TAKE 1 TABLET BY MOUTH EVERY NIGHT AT BEDTIME, Disp: 30 tablet, Rfl: 4 .  aspirin 81 MG tablet, Take 81 mg by mouth 2 (two) times daily. , Disp: , Rfl:  .  hydrocortisone 2.5 % cream, Apply topically 2 (two) times daily. (Patient not taking: Reported on 02/27/2018), Disp: 30 g, Rfl: 3 .  Naproxen 375 MG TBEC, Take 1 tablet (375 mg total) by mouth 2 (two) times daily as needed. (Patient not taking: Reported on 02/27/2018), Disp: 60 each, Rfl: 0  Review of Systems  Constitutional: Positive for fatigue.  HENT: Positive for tinnitus.   Eyes: Negative.   Respiratory: Negative.  Cardiovascular: Negative.   Endocrine: Negative.   Musculoskeletal: Negative.   Allergic/Immunologic: Negative.   Neurological: Positive for numbness.  Hematological: Negative.   Psychiatric/Behavioral: Negative.   All other systems reviewed and are negative.   Social History   Tobacco Use  . Smoking status: Former Smoker    Types: Cigarettes  . Smokeless tobacco: Never Used  . Tobacco comment: quit around 1970  Substance Use Topics  . Alcohol use: Yes    Alcohol/week: 7.0 - 14.0 standard drinks    Types: 7 Shots of liquor per week      Objective:   BP (!) 146/89   Pulse 65   Temp (!) 97.1 F (36.2 C) (Temporal)   Resp 18   Ht 5\' 10"  (1.778 m)   Wt 173 lb (78.5 kg)    BMI 24.82 kg/m  Vitals:   11/08/18 0951  BP: (!) 146/89  Pulse: 65  Resp: 18  Temp: (!) 97.1 F (36.2 C)  TempSrc: Temporal  Weight: 173 lb (78.5 kg)  Height: 5\' 10"  (1.778 m)  Body mass index is 24.82 kg/m.   Physical Exam Vitals signs reviewed.  Constitutional:      Appearance: He is well-developed.  HENT:     Head: Normocephalic and atraumatic.     Right Ear: External ear normal.     Left Ear: External ear normal.     Nose: Nose normal.  Eyes:     General: No scleral icterus.    Conjunctiva/sclera: Conjunctivae normal.  Neck:     Thyroid: No thyromegaly.  Cardiovascular:     Rate and Rhythm: Normal rate and regular rhythm.     Heart sounds: Normal heart sounds.  Pulmonary:     Effort: Pulmonary effort is normal.     Breath sounds: Normal breath sounds.  Abdominal:     Palpations: Abdomen is soft.  Skin:    General: Skin is warm and dry.  Neurological:     Mental Status: He is alert and oriented to person, place, and time.  Psychiatric:        Mood and Affect: Mood normal.        Behavior: Behavior normal.        Thought Content: Thought content normal.        Judgment: Judgment normal.      No results found for any visits on 11/08/18.     Assessment & Plan    1.h/o  Prostate cancer ([place])  - CBC with Differential/Platelet - Comprehensive metabolic panel - Lipid panel - TSH + free T4 - PSA  2. Encounter for Medicare annual wellness exam  - CBC with Differential/Platelet - Comprehensive metabolic panel - Lipid panel - TSH + free T4 - PSA  3. Hyperlipidemia, unspecified hyperlipidemia type Also followed by cardiology - CBC with Differential/Platelet - Comprehensive metabolic panel - Lipid panel - TSH + free T4 - PSA  4. Essential (primary) hypertension  - CBC with Differential/Platelet - Comprehensive metabolic panel - Lipid panel - TSH + free T4 - PSA  5. Insomnia, persistent Patient again advised to cut back on zolpidem  6.  Positive colorectal cancer screening using Cologuard test Patient had a positive Cologuard earlier in the year and he and GI decided to just follow this.  This is reasonable at age 65.  I would not pursue any further testing either with FIT or Cologuard 7. Spinal Stenosis    [name], MD  [facility]

## 2018-11-08 ENCOUNTER — Encounter: Payer: Self-pay | Admitting: Family Medicine

## 2018-11-08 ENCOUNTER — Other Ambulatory Visit: Payer: Self-pay | Admitting: Family Medicine

## 2018-11-08 ENCOUNTER — Ambulatory Visit (INDEPENDENT_AMBULATORY_CARE_PROVIDER_SITE_OTHER): Payer: Medicare Other | Admitting: Family Medicine

## 2018-11-08 ENCOUNTER — Other Ambulatory Visit: Payer: Self-pay

## 2018-11-08 VITALS — BP 146/89 | HR 65 | Temp 97.1°F | Resp 18 | Ht 70.0 in | Wt 173.0 lb

## 2018-11-08 DIAGNOSIS — G4700 Insomnia, unspecified: Secondary | ICD-10-CM

## 2018-11-08 DIAGNOSIS — E785 Hyperlipidemia, unspecified: Secondary | ICD-10-CM

## 2018-11-08 DIAGNOSIS — C61 Malignant neoplasm of prostate: Secondary | ICD-10-CM

## 2018-11-08 DIAGNOSIS — M4800 Spinal stenosis, site unspecified: Secondary | ICD-10-CM

## 2018-11-08 DIAGNOSIS — I10 Essential (primary) hypertension: Secondary | ICD-10-CM

## 2018-11-08 DIAGNOSIS — Z0000 Encounter for general adult medical examination without abnormal findings: Secondary | ICD-10-CM

## 2018-11-08 DIAGNOSIS — R195 Other fecal abnormalities: Secondary | ICD-10-CM | POA: Diagnosis not present

## 2018-11-08 NOTE — Patient Instructions (Signed)
Continue all things discussed during the visit. An order for your labs has been added. Make sure to Fast for them. Please return in 6 months for a Follow Up.

## 2018-11-12 ENCOUNTER — Other Ambulatory Visit: Payer: Self-pay | Admitting: Family Medicine

## 2018-11-12 DIAGNOSIS — Z0000 Encounter for general adult medical examination without abnormal findings: Secondary | ICD-10-CM | POA: Diagnosis not present

## 2018-11-12 DIAGNOSIS — I10 Essential (primary) hypertension: Secondary | ICD-10-CM | POA: Diagnosis not present

## 2018-11-12 DIAGNOSIS — C61 Malignant neoplasm of prostate: Secondary | ICD-10-CM | POA: Diagnosis not present

## 2018-11-12 DIAGNOSIS — E785 Hyperlipidemia, unspecified: Secondary | ICD-10-CM | POA: Diagnosis not present

## 2018-11-12 MED ORDER — ATORVASTATIN CALCIUM 10 MG PO TABS: ORAL_TABLET | ORAL | 3 refills | Status: DC

## 2018-11-12 NOTE — Telephone Encounter (Signed)
Recent office 11/08/18, but patient has not had his labs drawn from what I see, is this okay to fill? KW

## 2018-11-12 NOTE — Telephone Encounter (Signed)
Patient needs refills on Atorvastatin 10 mg. Sent to [facility]

## 2018-11-13 LAB — CBC WITH DIFFERENTIAL/PLATELET
Basophils Absolute: 0.1 10*3/uL (ref 0.0–0.2)
Basos: 1 %
EOS (ABSOLUTE): 0.4 10*3/uL (ref 0.0–0.4)
Eos: 6 %
Hematocrit: 42.6 % (ref 37.5–51.0)
Hemoglobin: 15.1 g/dL (ref 13.0–17.7)
Immature Grans (Abs): 0 10*3/uL (ref 0.0–0.1)
Immature Granulocytes: 0 %
Lymphocytes Absolute: 2 10*3/uL (ref 0.7–3.1)
Lymphs: 32 %
MCH: 34.7 pg — ABNORMAL HIGH (ref 26.6–33.0)
MCHC: 35.4 g/dL (ref 31.5–35.7)
MCV: 98 fL — ABNORMAL HIGH (ref 79–97)
Monocytes Absolute: 0.7 10*3/uL (ref 0.1–0.9)
Monocytes: 12 %
Neutrophils Absolute: 3.1 10*3/uL (ref 1.4–7.0)
Neutrophils: 49 %
Platelets: 226 10*3/uL (ref 150–450)
RBC: 4.35 x10E6/uL (ref 4.14–5.80)
RDW: 13.1 % (ref 11.6–15.4)
WBC: 6.2 10*3/uL (ref 3.4–10.8)

## 2018-11-13 LAB — COMPREHENSIVE METABOLIC PANEL
ALT: 19 IU/L (ref 0–44)
AST: 20 IU/L (ref 0–40)
Albumin/Globulin Ratio: 2.3 — ABNORMAL HIGH (ref 1.2–2.2)
Albumin: 4.3 g/dL (ref 3.6–4.6)
Alkaline Phosphatase: 80 IU/L (ref 39–117)
BUN/Creatinine Ratio: 10 (ref 10–24)
BUN: 11 mg/dL (ref 8–27)
Bilirubin Total: 0.8 mg/dL (ref 0.0–1.2)
CO2: 24 mmol/L (ref 20–29)
Calcium: 9.4 mg/dL (ref 8.6–10.2)
Chloride: 103 mmol/L (ref 96–106)
Creatinine, Ser: 1.11 mg/dL (ref 0.76–1.27)
GFR calc Af Amer: 72 mL/min/{1.73_m2} (ref >59)
GFR calc non Af Amer: 62 mL/min/{1.73_m2} (ref >59)
Globulin, Total: 1.9 g/dL (ref 1.5–4.5)
Glucose: 93 mg/dL (ref 65–99)
Potassium: 4 mmol/L (ref 3.5–5.2)
Sodium: 145 mmol/L — ABNORMAL HIGH (ref 134–144)
Total Protein: 6.2 g/dL (ref 6.0–8.5)

## 2018-11-13 LAB — LIPID PANEL
Chol/HDL Ratio: 4.2 ratio (ref 0.0–5.0)
Cholesterol, Total: 169 mg/dL (ref 100–199)
HDL: 40 mg/dL (ref >39)
LDL Chol Calc (NIH): 68 mg/dL (ref 0–99)
Triglycerides: 393 mg/dL — ABNORMAL HIGH (ref 0–149)
VLDL Cholesterol Cal: 61 mg/dL — ABNORMAL HIGH (ref 5–40)

## 2018-11-13 LAB — TSH+FREE T4
Free T4: 1.2 ng/dL (ref 0.82–1.77)
TSH: 1.76 u[IU]/mL (ref 0.450–4.500)

## 2018-11-13 LAB — PSA: Prostate Specific Ag, Serum: <0.1 ng/mL (ref 0.0–4.0)

## 2018-11-21 DIAGNOSIS — I495 Sick sinus syndrome: Secondary | ICD-10-CM | POA: Diagnosis not present

## 2018-11-21 DIAGNOSIS — E782 Mixed hyperlipidemia: Secondary | ICD-10-CM | POA: Diagnosis not present

## 2018-11-21 DIAGNOSIS — I10 Essential (primary) hypertension: Secondary | ICD-10-CM | POA: Diagnosis not present

## 2018-12-18 DIAGNOSIS — I495 Sick sinus syndrome: Secondary | ICD-10-CM | POA: Diagnosis not present

## 2018-12-29 ENCOUNTER — Other Ambulatory Visit: Payer: Self-pay | Admitting: Family Medicine

## 2018-12-30 NOTE — Telephone Encounter (Signed)
Requested medication (s) are due for refill today: yes  Requested medication (s) are on the active medication list: yes  Last refill: 11/30/2018  Future visit scheduled: yes  Notes to clinic:  refill cannot be delegated    Requested Prescriptions  Pending Prescriptions Disp Refills   traMADol (ULTRAM) 50 MG tablet [Pharmacy Med Name: TRAMADOL HCL 50 MG TAB] 60 tablet     Sig: TAKE 1 TABLET BY MOUTH TWICE (2) DAILY AS NEEDED FOR PAIN      Not Delegated - Analgesics:  Opioid Agonists Failed - 12/29/2018  9:22 AM      Failed - This refill cannot be delegated      Failed - Urine Drug Screen completed in last 360 days.      Passed - Valid encounter within last 6 months    Recent Outpatient Visits           1 month ago Prostate cancer [facility])   [facility] Jerrol Banana., MD   4 months ago Lymphadenitis   [facility] Jerrol Banana., MD   1 year ago Neck pain   [facility] Jerrol Banana., MD   1 year ago Essential (primary) hypertension   [facility] Jerrol Banana., MD   1 year ago Essential (primary) hypertension   [facility] Jerrol Banana., MD       Future Appointments             In 4 months Jerrol Banana., MD [facility], [place]

## 2019-01-29 ENCOUNTER — Other Ambulatory Visit: Payer: Self-pay | Admitting: Family Medicine

## 2019-01-29 NOTE — Telephone Encounter (Signed)
Requested medication (s) are due for refill today: yes  Requested medication (s) are on the active medication list: yes  Last refill:  12/31/2018  Future visit scheduled: yes  Notes to clinic:  not delegated    Requested Prescriptions  Pending Prescriptions Disp Refills   traMADol (ULTRAM) 50 MG tablet [Pharmacy Med Name: TRAMADOL HCL 50 MG TAB] 60 tablet     Sig: TAKE 1 TABLET BY MOUTH TWICE (2) DAILY AS NEEDED FOR PAIN      Not Delegated - Analgesics:  Opioid Agonists Failed - 01/29/2019  9:35 AM      Failed - This refill cannot be delegated      Failed - Urine Drug Screen completed in last 360 days.      Passed - Valid encounter within last 6 months    Recent Outpatient Visits           2 months ago Prostate cancer [facility])   [facility] Jerrol Banana., MD   5 months ago Lymphadenitis   [facility] Jerrol Banana., MD   1 year ago Neck pain   [facility] Jerrol Banana[facility] MD   1 year ago Essential (primary) hypertension   [facility] Jerrol Banana., MD   1 year ago Essential (primary) hypertension   [facility] Jerrol Banana., MD       Future Appointments             In 3 months Jerrol Banana., MD [facility], [place]

## 2019-02-10 DIAGNOSIS — Z23 Encounter for immunization: Secondary | ICD-10-CM | POA: Diagnosis not present

## 2019-03-05 DIAGNOSIS — Z23 Encounter for immunization: Secondary | ICD-10-CM | POA: Diagnosis not present

## 2019-03-22 IMAGING — CR DG SHOULDER 2+V*R*
1 series · 3 of 3 positions shown · non-contrast
Comparison: Limited views of the right shoulder from a chest x-ray
dated May 19, 2011

CLINICAL DATA: Superior right-sided shoulder pain without limited
range of motion. Painful to abduct the shoulder to a certain height.

EXAM:
RIGHT SHOULDER - 2+ VIEW

[Series 1: dg shoulder right · 0.14mm/px · 3 of 3 slices shown]
[im 1/3]
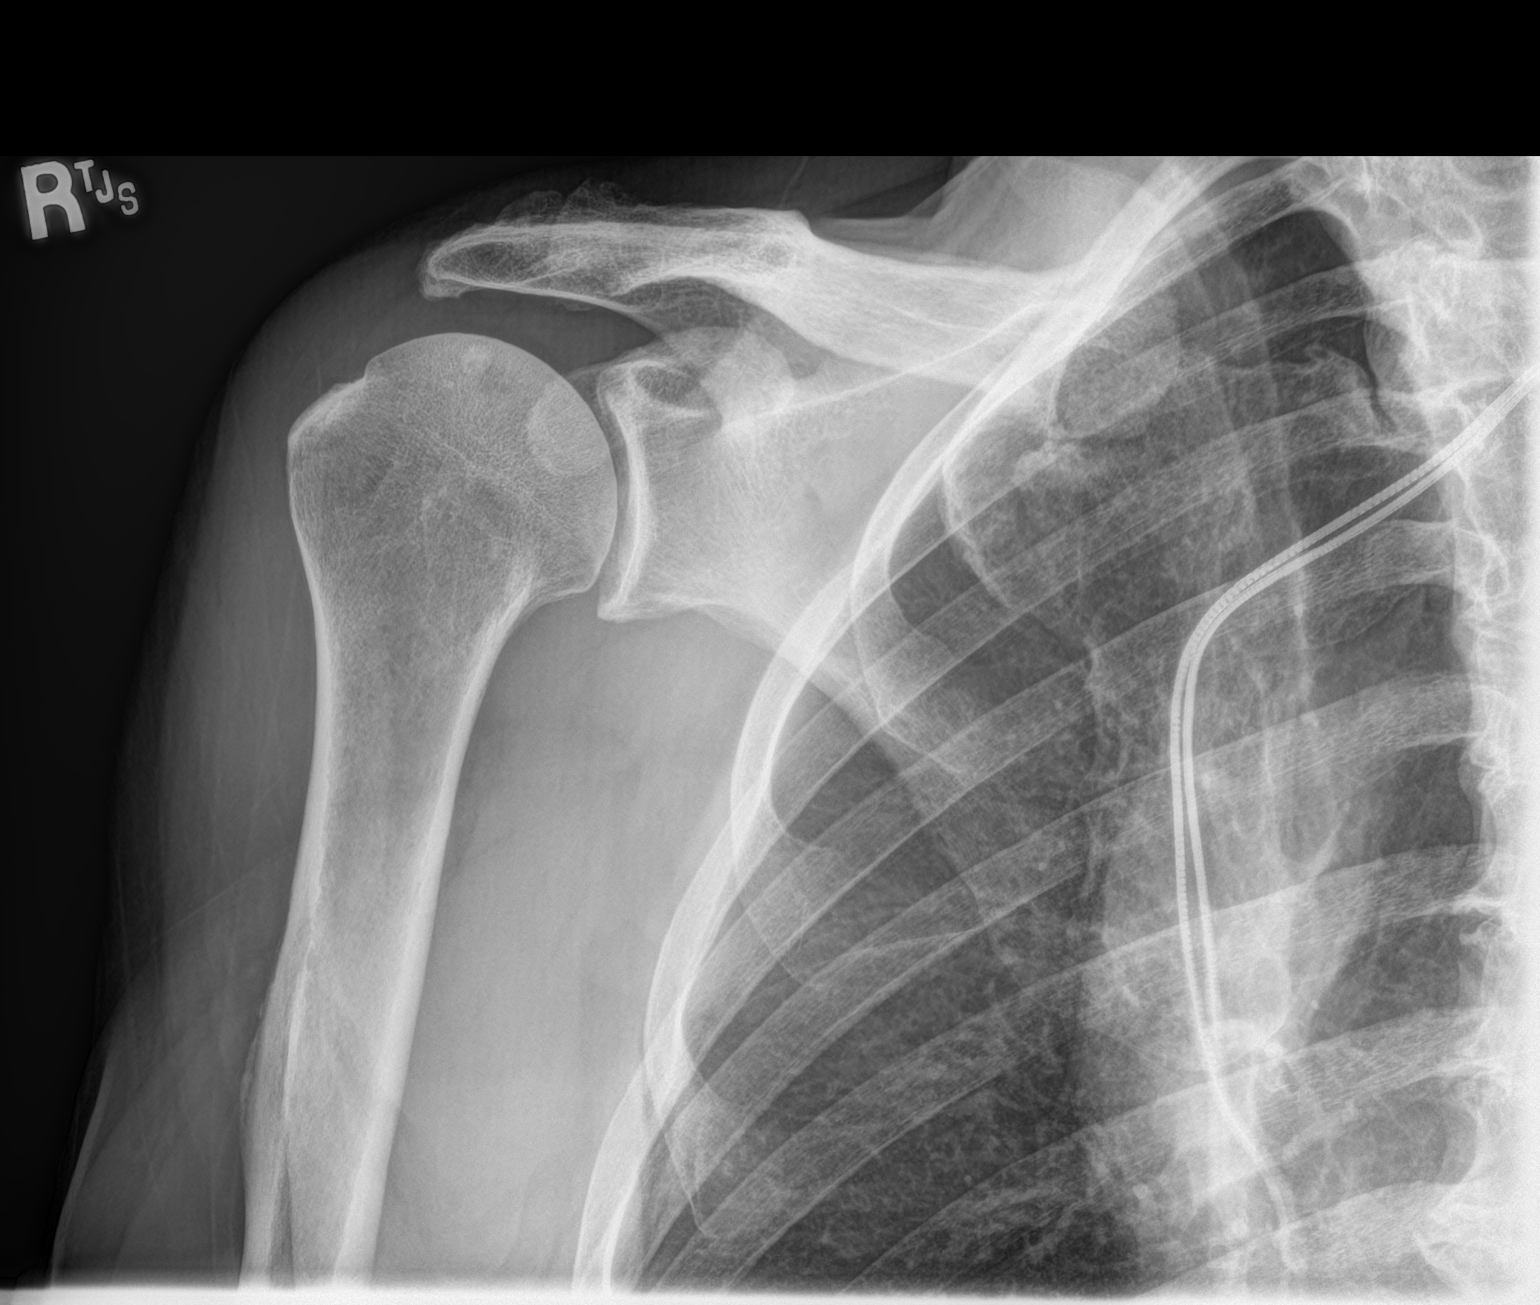
[im 2/3]
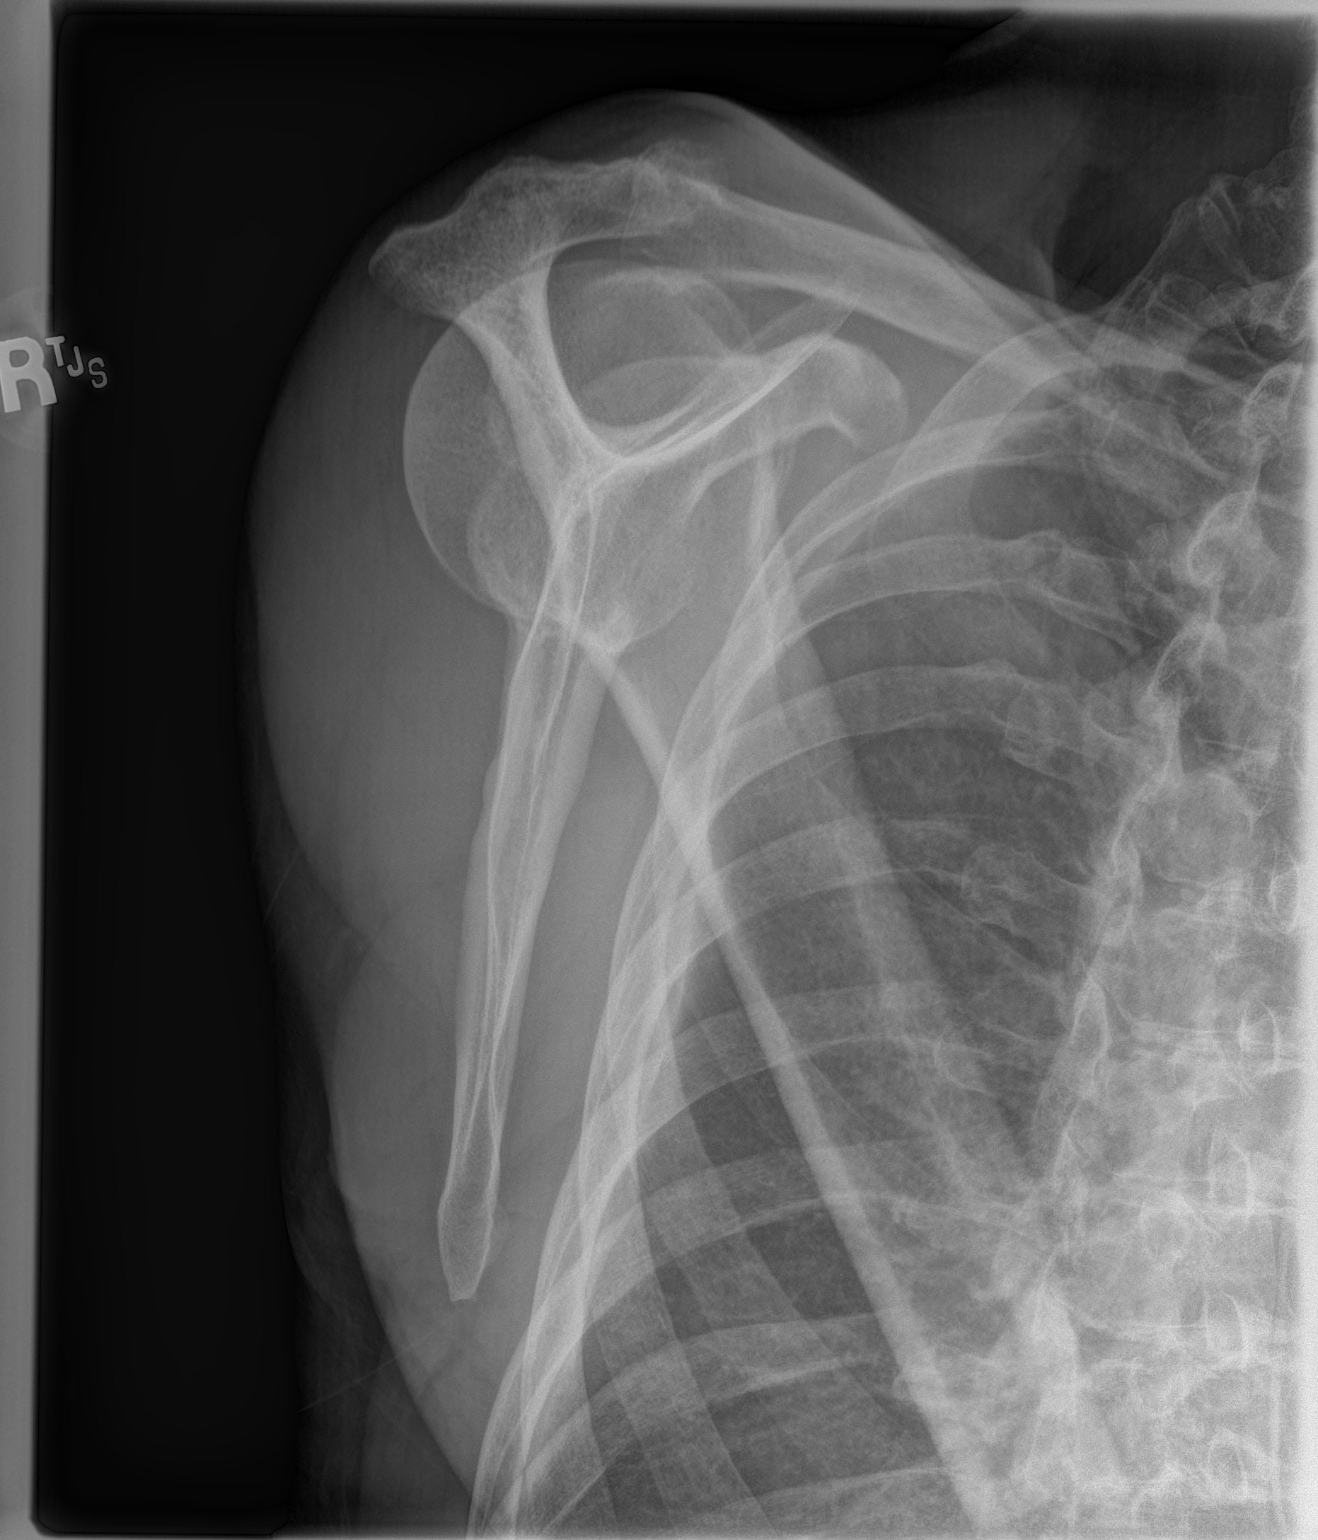
[im 3/3]
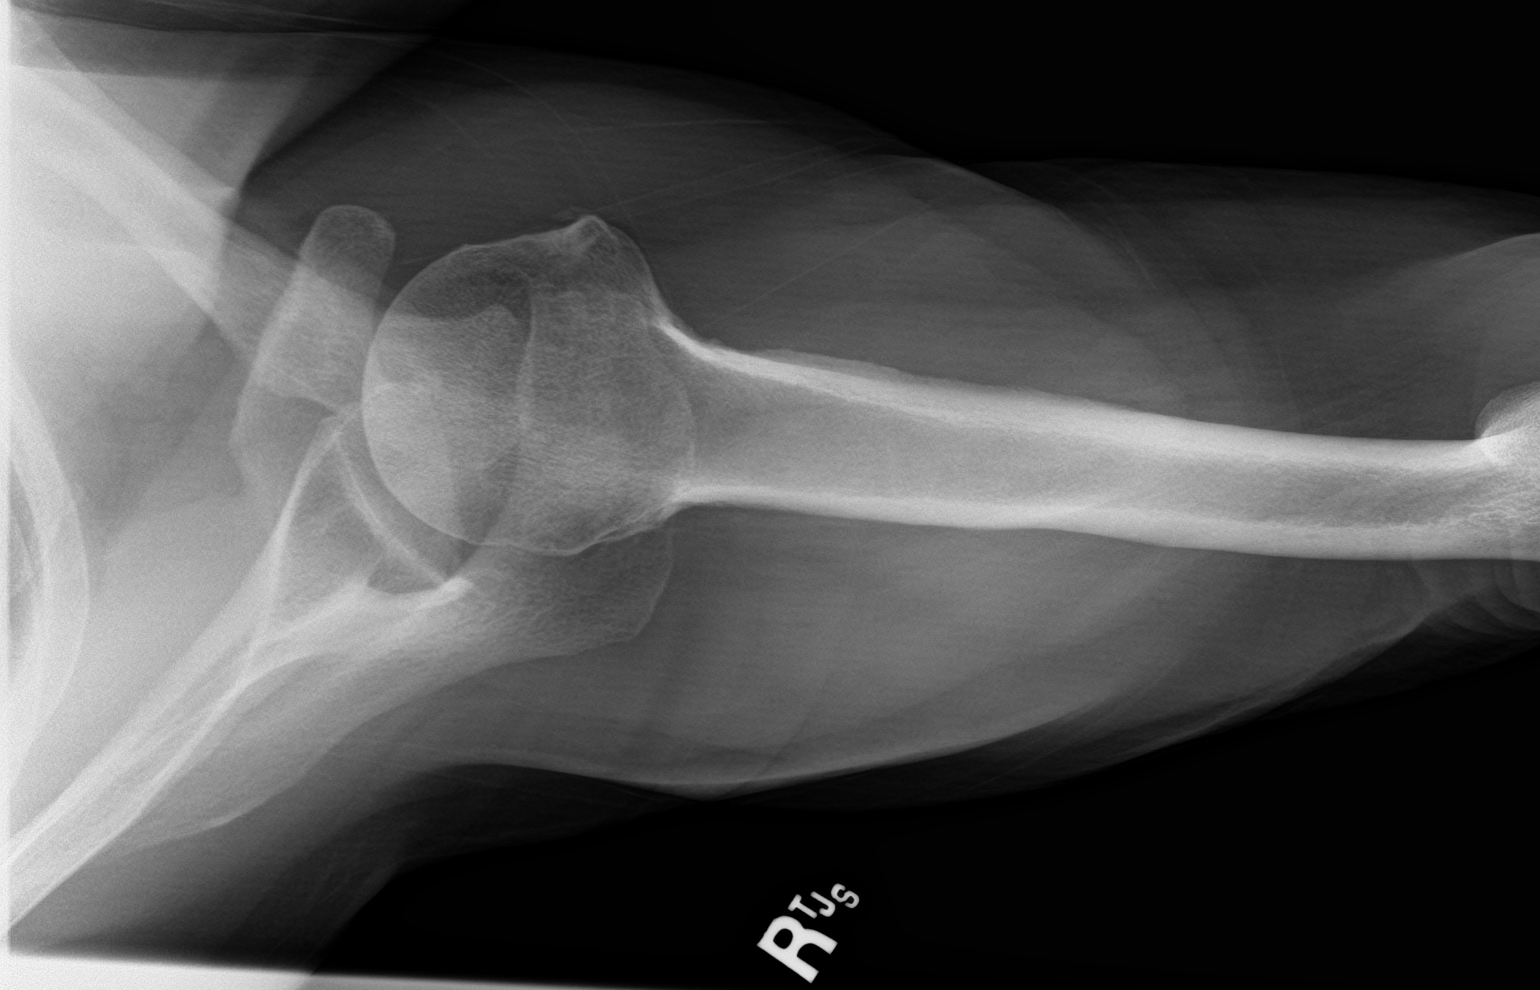

[3 of 3 positions shown; findings below may reference images not displayed]

FINDINGS: The bones are subjectively adequately mineralized. The glenohumeral
joint space is well maintained. The humeral head appears intact and
smoothly marginated. There is a subacromial spur laterally which
narrows the subacromial subdeltoid space. There are degenerative
changes about the AC joint. There is no acute fracture or
dislocation.
IMPRESSION: Osteoarthritic change of the AC joint. Prominent subacromial spur
which may be impacting the rotator cuff. No acute bony abnormality.

## 2019-03-22 IMAGING — CR DG CERVICAL SPINE COMPLETE 4+V
1 series · 6 of 6 positions shown · non-contrast
Comparison: None.

CLINICAL DATA: Shoulder pain possibly due to arthropathy in the
neck.

EXAM:
CERVICAL SPINE - COMPLETE 4+ VIEW

[Series 1: dg cervical spine complete · 0.14mm/px · 6 of 6 slices shown]
[im 1/6]
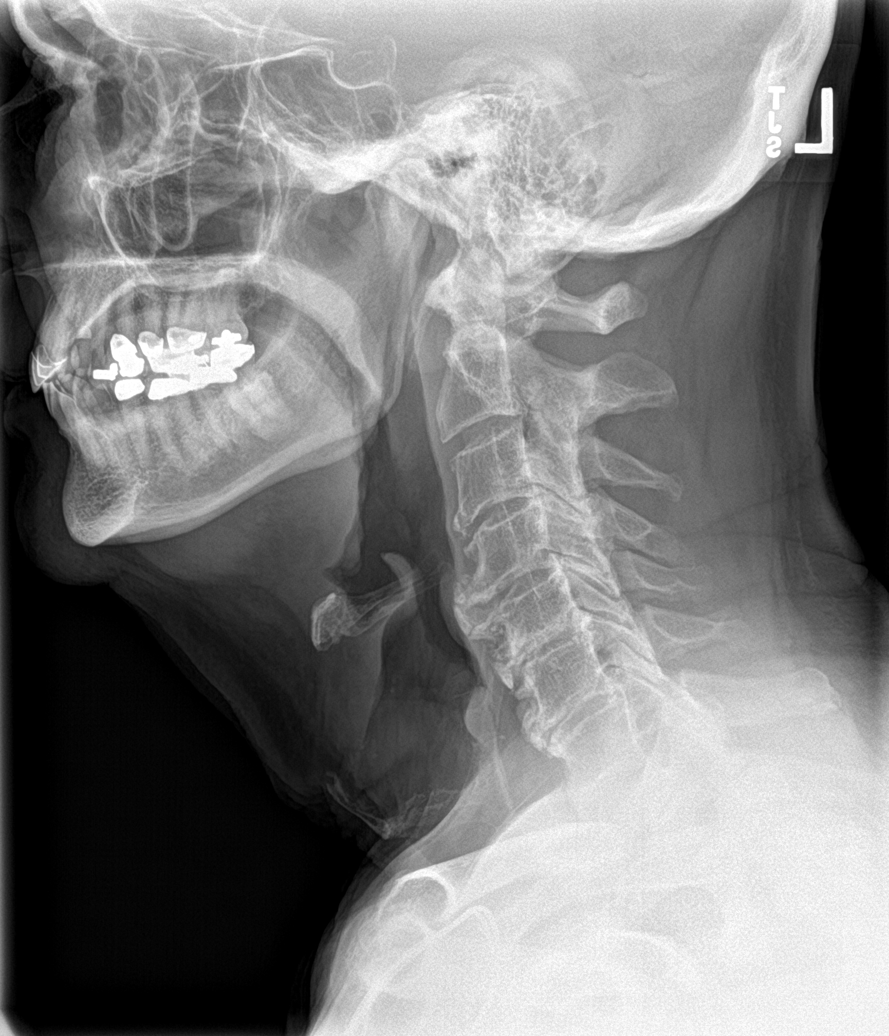
[im 2/6]
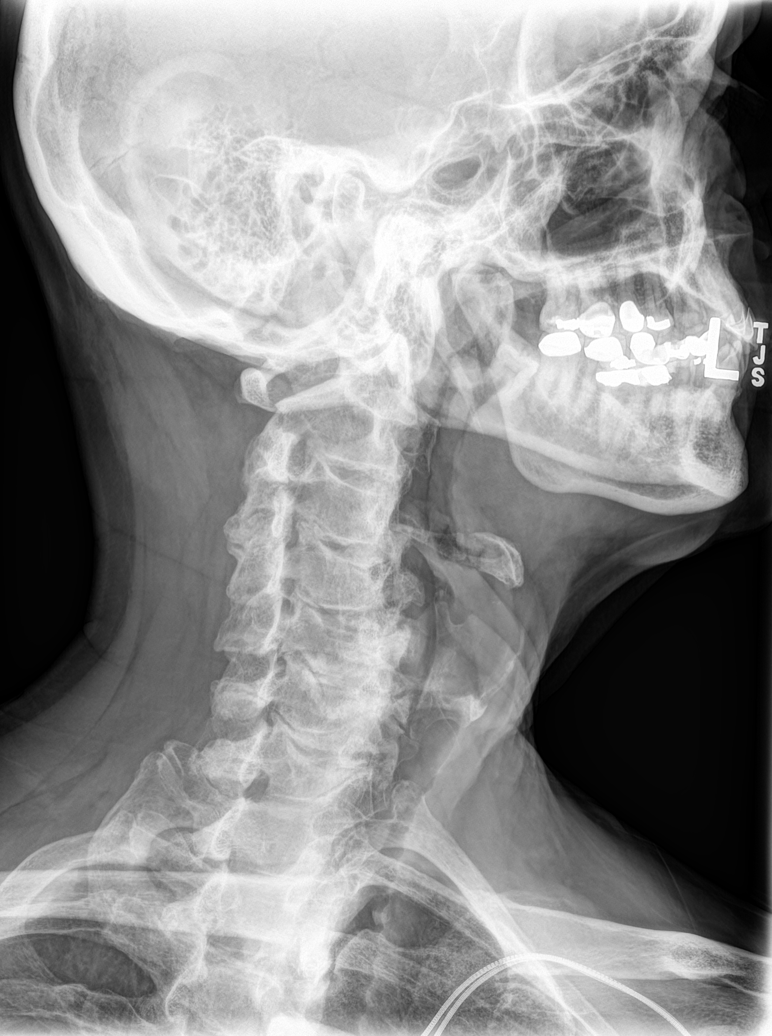
[im 3/6]
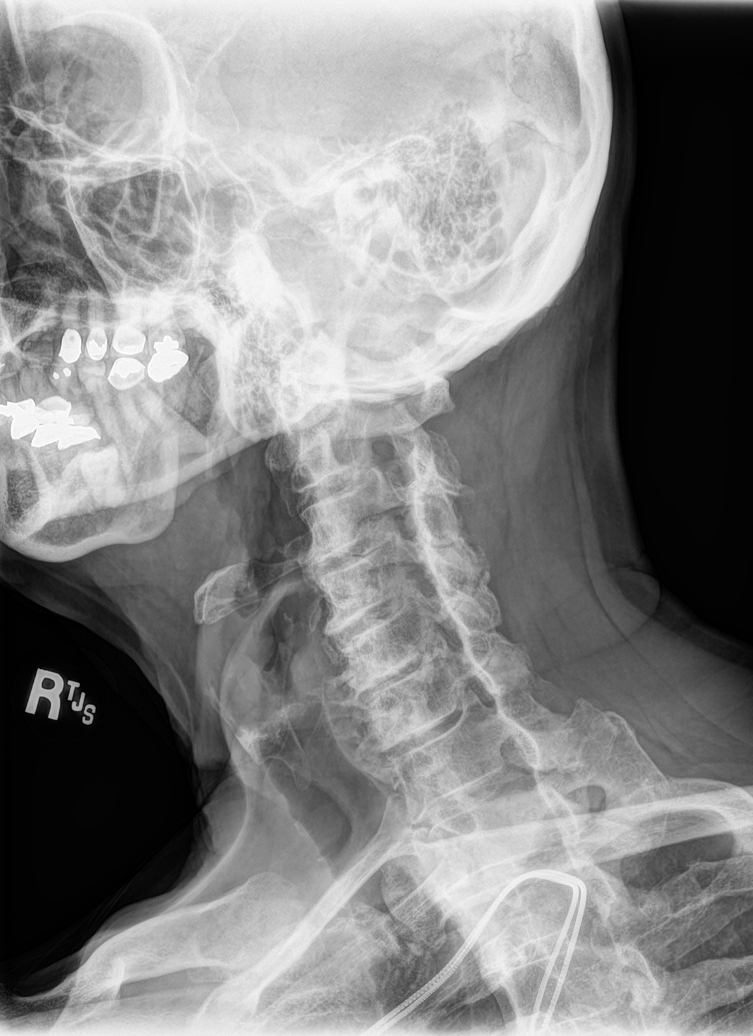
[im 4/6]
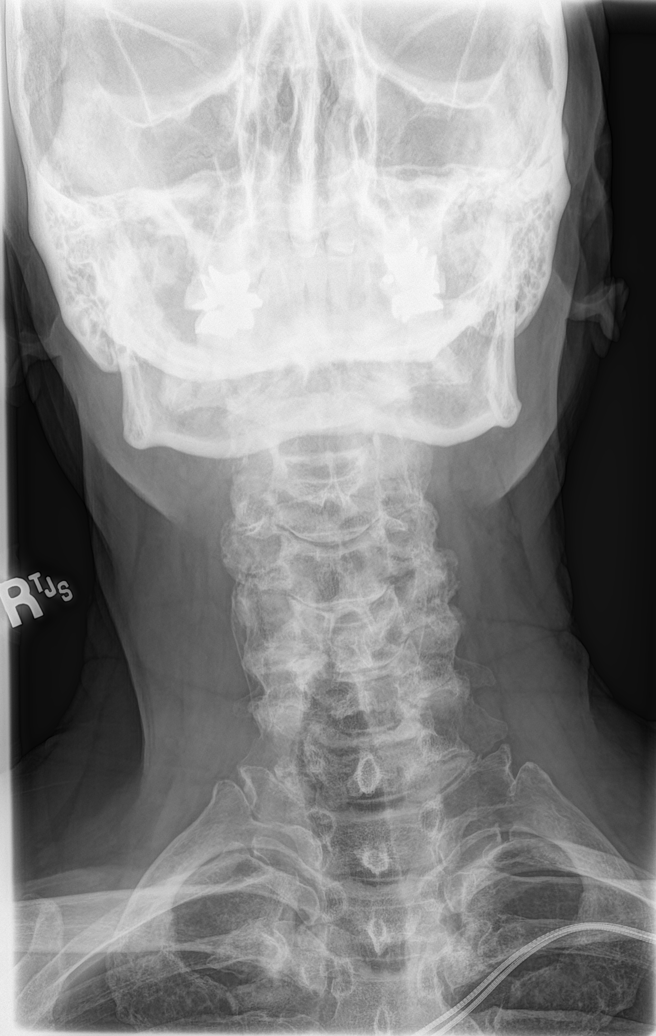
[im 5/6]
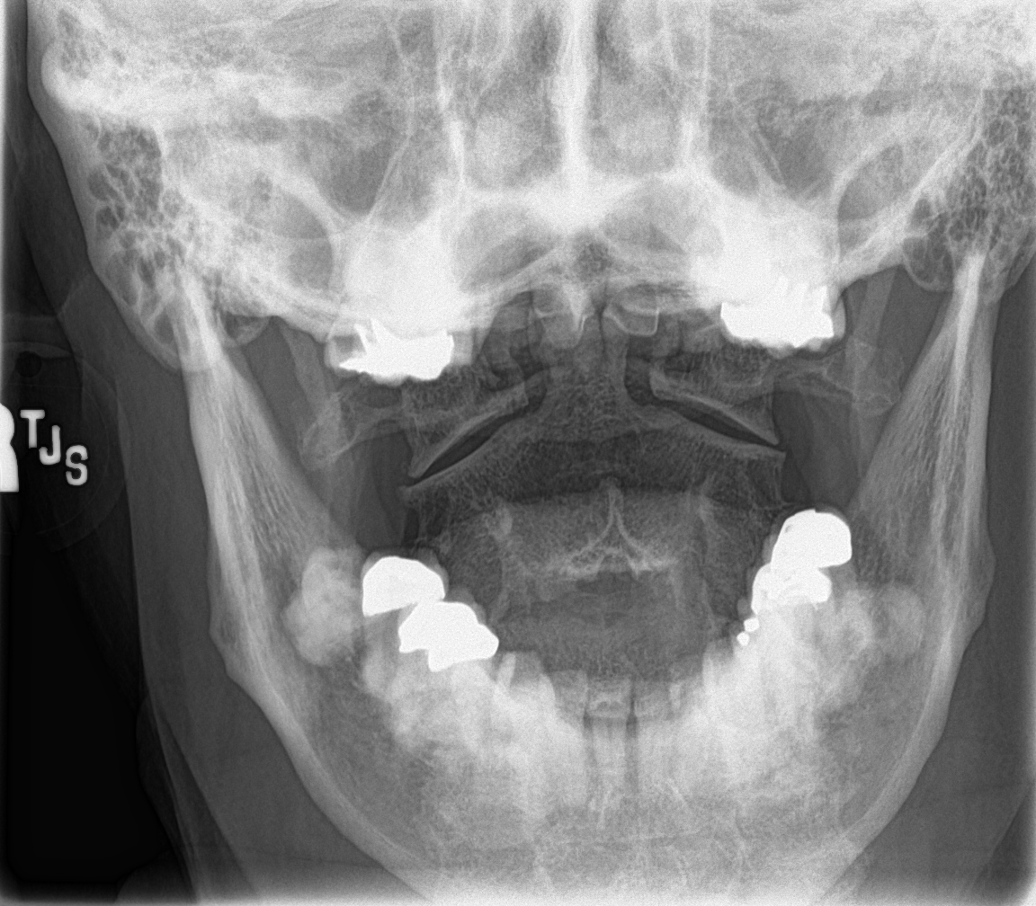
[im 6/6]
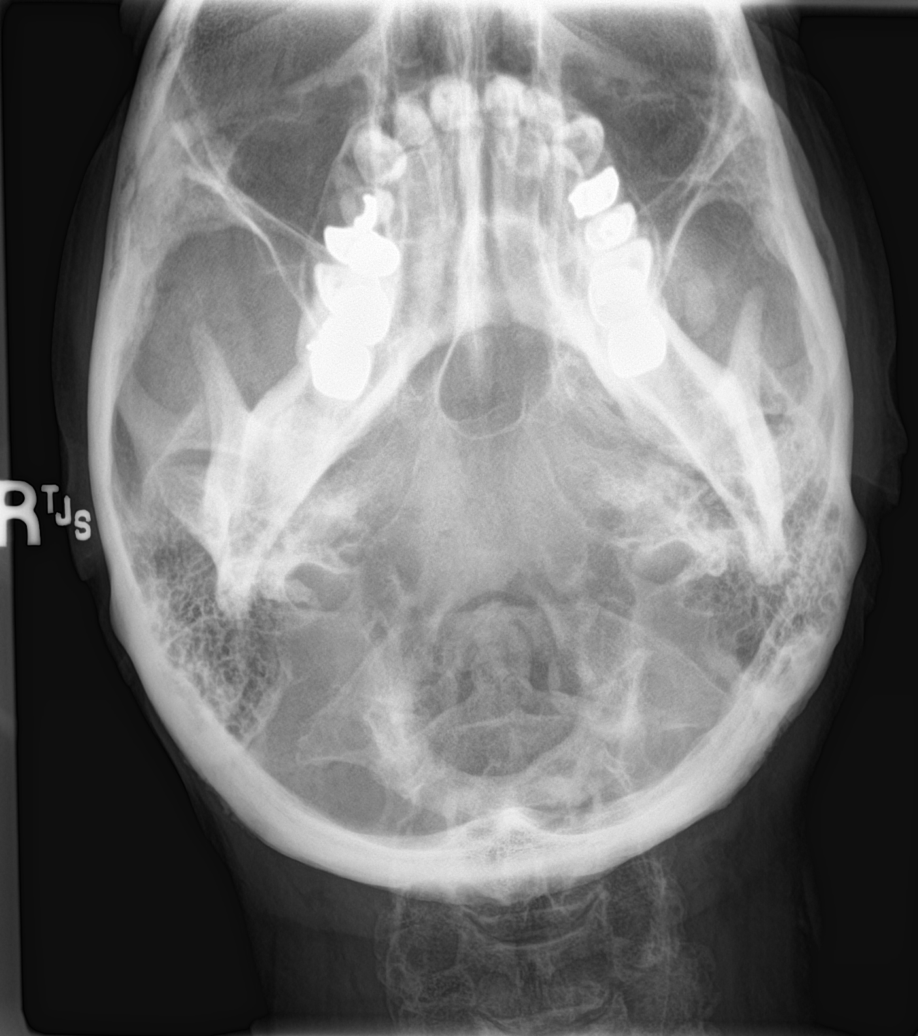

[6 of 6 positions shown; findings below may reference images not displayed]

FINDINGS: The cervical vertebral bodies are preserved in height. There is disc
space narrowing at C5-6 and C6-7. There are large anterior bridging
and near bridging osteophytes at C3-4, C4-5, C5-6, and C6-7. There
is facet joint hypertrophy at all cervical levels. The spinous
processes are intact. The oblique views reveal moderate bony
encroachment upon the neural foramina at multiple levels bilaterally
greatest on the right. The prevertebral soft tissue spaces appear
normal. The odontoid is intact where visualized.
IMPRESSION: Multilevel degenerative disc, facet joint, and uncovertebral joint
change. Bony encroachment upon the neural foramina at multiple
levels bilaterally greatest on the right. No compression fracture or
listhesis.

## 2019-04-11 ENCOUNTER — Other Ambulatory Visit: Payer: Self-pay | Admitting: Family Medicine

## 2019-05-01 ENCOUNTER — Other Ambulatory Visit: Payer: Self-pay | Admitting: Family Medicine

## 2019-05-01 DIAGNOSIS — G4700 Insomnia, unspecified: Secondary | ICD-10-CM

## 2019-05-01 NOTE — Telephone Encounter (Signed)
Requested medication (s) are due for refill today - yes  Requested medication (s) are on the active medication list -yes  Future visit scheduled -yes  Last refill: 04/01/19  Notes to clinic: Request for non delegated Rx  Requested Prescriptions  Pending Prescriptions Disp Refills   zolpidem (AMBIEN) 10 MG tablet [Pharmacy Med Name: ZOLPIDEM TARTRATE 10 MG TAB] 30 tablet     Sig: TAKE 1 TABLET BY MOUTH AT BEDTIME      Not Delegated - Psychiatry:  Anxiolytics/Hypnotics Failed - 05/01/2019  9:28 AM      Failed - This refill cannot be delegated      Failed - Urine Drug Screen completed in last 360 days.      Failed - Valid encounter within last 6 months    Recent Outpatient Visits           5 months ago Prostate cancer [facility])   [facility] Jerrol Banana., MD   8 months ago Lymphadenitis   [facility] Jerrol Banana., MD   1 year ago Neck pain   [facility] Jerrol Banana., MD   1 year ago Essential (primary) hypertension   [facility] Jerrol Banana., MD   2 years ago Essential (primary) hypertension   [facility] Jerrol Banana., MD       Future Appointments             In 1 week Jerrol Banana., MD [facility]                Requested Prescriptions  Pending Prescriptions Disp Refills   zolpidem (AMBIEN) 10 MG tablet [Pharmacy Med Name: ZOLPIDEM TARTRATE 10 MG TAB] 30 tablet     Sig: TAKE 1 TABLET BY MOUTH AT BEDTIME      Not Delegated - Psychiatry:  Anxiolytics/Hypnotics Failed - 05/01/2019  9:28 AM      Failed - This refill cannot be delegated      Failed - Urine Drug Screen completed in last 360 days.      Failed - Valid encounter within last 6 months    Recent Outpatient Visits           5 months ago Prostate cancer [facility])   [facility] Jerrol Banana., MD   8 months ago Lymphadenitis   [facility] Jerrol Banana., MD   1 year ago Neck pain   [facility] Jerrol Banana., MD   1 year ago Essential (primary) hypertension   [facility] Jerrol Banana., MD   2 years ago Essential (primary) hypertension   [facility] Jerrol Banana., MD       Future Appointments             In 1 week Jerrol Banana., MD [facility], [place]

## 2019-05-06 NOTE — Progress Notes (Signed)
Trena Platt Cummings,acting as a scribe for [name], MD.,have documented all relevant documentation on the behalf of [name], MD,as directed by  [name], MD while in the presence of [name], MD.  Established patient visit   Patient: Wesley Small   DOB: Apr 12, 1935   84 y.o. Male  MRN: [id number] Visit Date: 05/09/2019  Today's healthcare provider: [name], MD   Chief Complaint  Patient presents with  . Hypertension   Subjective    HPI  Patient is overall feeling well.  He is slowly feeling older than in years past.  He is not as strong nor is energetic and has lost some muscle mass. Hypertension, follow-up  BP Readings from Last 3 Encounters:  05/09/19 (!) 164/85  11/08/18 (!) 146/89  08/21/18 136/72   He was last seen for hypertension 6 months ago.  BP at that visit was 146/89. Management since that visit includes; continue current medication. He reports excellent compliance with treatment. He is not having side effects.  He is exercising. He is adherent to low salt diet.   Outside blood pressures are not checking.  He does not smoke.  Use of agents associated with hypertension: none.   ----------------------------------------------------------------------------------------- Lipid/Cholesterol, follow-up  Last Lipid Panel: Lab Results  Component Value Date   CHOL 169 11/12/2018   LDLCALC 68 11/12/2018   HDL 40 11/12/2018   TRIG 393 (H) 11/12/2018   ALT 19 11/12/2018   AST 20 11/12/2018   PLT 226 11/12/2018    He was last seen for this 6 months ago.  Management since that visit includes; labs checked showing-normal. He reports excellent compliance with treatment. He is not having side effects.  He is following a Regular diet. Current exercise: yard work  [employer] from Last 3 Encounters:  05/09/19 172 lb 3.2 oz (78.1 kg)  11/08/18 173 lb (78.5 kg)  08/21/18 163 lb (73.9 kg)   Last metabolic  panel Lab Results  Component Value Date   GLUCOSE 93 11/12/2018   NA 145 (H) 11/12/2018   K 4.0 11/12/2018   BUN 11 11/12/2018   CREATININE 1.11 11/12/2018   GFRNONAA 62 11/12/2018   GFRAA 72 11/12/2018   CALCIUM 9.4 11/12/2018   AST 20 11/12/2018   ALT 19 11/12/2018   The ASCVD Risk score (Goff DC Jr., et al., 2013) failed to calculate for the following reasons:   The 2013 ASCVD risk score is only valid for ages 54 to 82  -----------------------------------------------------------------------------------------        Medications: Outpatient Medications Prior to Visit  Medication Sig  . acetaminophen (TYLENOL) 500 MG tablet Take 500 mg every 6 (six) hours as needed by mouth for moderate pain or headache.   Marland Kitchen ascorbic acid (VITAMIN C) 500 MG tablet Take 500 mg by mouth daily.   Marland Kitchen aspirin 81 MG tablet Take 81 mg by mouth 2 (two) times daily.   Marland Kitchen atorvastatin (LIPITOR) 10 MG tablet TAKE 10 MG BY MOUTH EVERY DAY AT BEDTIME  . Cholecalciferol (VITAMIN D3) 2000 units TABS Take 2,000 Units by mouth daily.  . Coenzyme Q10 100 MG capsule Take 100 mg daily by mouth.   . losartan (COZAAR) 100 MG tablet TAKE 1 TABLET BY MOUTH ONCE DAILY  . Magnesium Oxide 400 MG CAPS Take 400 mg by mouth daily.   . metoprolol succinate (TOPROL-XL) 25 MG 24 hr tablet Take 25 mg daily by mouth.   . NON FORMULARY Apply  1 mL topically every other day. Testosterone 20% 90gm   . Omega-3 Fatty Acids (SALMON OIL-1000 PO) Take 1,000 mg by mouth daily.  Marland Kitchen omeprazole (PRILOSEC) 20 MG capsule Take 20 mg by mouth at bedtime  . vitamin B-12 (CYANOCOBALAMIN) 500 MCG tablet Take 500 mcg by mouth daily.  Marland Kitchen zolpidem (AMBIEN) 10 MG tablet TAKE 1 TABLET BY MOUTH AT BEDTIME  . [DISCONTINUED] traMADol (ULTRAM) 50 MG tablet TAKE 1 TABLET BY MOUTH TWICE (2) DAILY AS NEEDED FOR PAIN  . hydrocortisone 2.5 % cream Apply topically 2 (two) times daily. (Patient not taking: Reported on 02/27/2018)  . Naproxen 375 MG TBEC Take 1  tablet (375 mg total) by mouth 2 (two) times daily as needed. (Patient not taking: Reported on 02/27/2018)   No facility-administered medications prior to visit.    Review of Systems  Constitutional: Negative for appetite change, chills and fever.  Eyes: Negative.   Respiratory: Negative for chest tightness, shortness of breath and wheezing.   Cardiovascular: Negative for chest pain and palpitations.  Gastrointestinal: Negative for abdominal pain, nausea and vomiting.  Endocrine: Negative.   Allergic/Immunologic: Negative.   Hematological: Negative.   Psychiatric/Behavioral: Negative.         Objective    BP (!) 164/85 (BP Location: Right Arm, Patient Position: Sitting, Cuff Size: Large)   Pulse 66   Temp (!) 97.1 F (36.2 C) (Temporal)   Ht 5\' 10"  (1.778 m)   Wt 172 lb 3.2 oz (78.1 kg)   BMI 24.71 kg/m  BP Readings from Last 3 Encounters:  05/09/19 (!) 164/85  11/08/18 (!) 146/89  08/21/18 136/72   Wt Readings from Last 3 Encounters:  05/09/19 172 lb 3.2 oz (78.1 kg)  11/08/18 173 lb (78.5 kg)  08/21/18 163 lb (73.9 kg)      Physical Exam Vitals reviewed.  Constitutional:      Appearance: Normal appearance.  HENT:     Head: Normocephalic and atraumatic.     Right Ear: External ear normal.     Left Ear: External ear normal.  Eyes:     General: No scleral icterus.    Conjunctiva/sclera: Conjunctivae normal.  Neck:     Vascular: No carotid bruit.  Cardiovascular:     Rate and Rhythm: Normal rate and regular rhythm.     Pulses: Normal pulses.     Heart sounds: Normal heart sounds.  Pulmonary:     Effort: Pulmonary effort is normal.     Breath sounds: Normal breath sounds.  Abdominal:     Palpations: Abdomen is soft.  Musculoskeletal:     Cervical back: Normal range of motion.     Right lower leg: No edema.     Left lower leg: No edema.  Neurological:     General: No focal deficit present.     Mental Status: He is alert and oriented to person, place,  and time.  Psychiatric:        Mood and Affect: Mood normal.        Behavior: Behavior normal.        Thought Content: Thought content normal.        Judgment: Judgment normal.       No results found for any visits on 05/09/19.  Assessment & Plan    1. Essential (primary) hypertension Check blood pressure at home twice a week and write down readings.  Losartan and metoprolol  2. Sick sinus syndrome [facility]) Per cardiology  3. Arthritis Clinically under control  4. Insomnia, persistent Advised patient I would love for him to stop the zolpidem.  He states he must have it.  I think he has been on it since before he moved here after his first wife died.  5. Hypercholesterolemia On atorvastatin  6. Artificial cardiac pacemaker In place for about 10 years   Return in about 3 months (around 08/08/2019).      I, [name], MD, have reviewed all documentation for this visit. The documentation on 05/12/19 for the exam, diagnosis, procedures, and orders are all accurate and complete.    Velina Drollinger Cranford Mon, MD  [facility] [phone] (phone) [phone] (fax)  [place]

## 2019-05-07 ENCOUNTER — Other Ambulatory Visit: Payer: Self-pay | Admitting: Family Medicine

## 2019-05-07 DIAGNOSIS — H04123 Dry eye syndrome of bilateral lacrimal glands: Secondary | ICD-10-CM | POA: Diagnosis not present

## 2019-05-09 ENCOUNTER — Other Ambulatory Visit: Payer: Self-pay

## 2019-05-09 ENCOUNTER — Encounter: Payer: Self-pay | Admitting: Family Medicine

## 2019-05-09 ENCOUNTER — Ambulatory Visit (INDEPENDENT_AMBULATORY_CARE_PROVIDER_SITE_OTHER): Payer: Medicare Other | Admitting: Family Medicine

## 2019-05-09 VITALS — BP 164/85 | HR 66 | Temp 97.1°F | Ht 70.0 in | Wt 172.2 lb

## 2019-05-09 DIAGNOSIS — M1990 Unspecified osteoarthritis, unspecified site: Secondary | ICD-10-CM

## 2019-05-09 DIAGNOSIS — G4700 Insomnia, unspecified: Secondary | ICD-10-CM | POA: Diagnosis not present

## 2019-05-09 DIAGNOSIS — I10 Essential (primary) hypertension: Secondary | ICD-10-CM | POA: Diagnosis not present

## 2019-05-09 DIAGNOSIS — E7800 Pure hypercholesterolemia, unspecified: Secondary | ICD-10-CM | POA: Diagnosis not present

## 2019-05-09 DIAGNOSIS — Z950 Presence of cardiac pacemaker: Secondary | ICD-10-CM

## 2019-05-09 DIAGNOSIS — I495 Sick sinus syndrome: Secondary | ICD-10-CM | POA: Diagnosis not present

## 2019-05-09 NOTE — Patient Instructions (Addendum)
Check BP at home and write down time, date and blood pressure readings!! Bring BP cuff for next visit!!

## 2019-05-22 DIAGNOSIS — E782 Mixed hyperlipidemia: Secondary | ICD-10-CM | POA: Diagnosis not present

## 2019-05-22 DIAGNOSIS — I10 Essential (primary) hypertension: Secondary | ICD-10-CM | POA: Diagnosis not present

## 2019-05-22 DIAGNOSIS — I495 Sick sinus syndrome: Secondary | ICD-10-CM | POA: Diagnosis not present

## 2019-05-28 DIAGNOSIS — I495 Sick sinus syndrome: Secondary | ICD-10-CM | POA: Diagnosis not present

## 2019-07-10 ENCOUNTER — Other Ambulatory Visit: Payer: Self-pay | Admitting: Family Medicine

## 2019-07-23 ENCOUNTER — Other Ambulatory Visit: Payer: Self-pay | Admitting: Family Medicine

## 2019-07-23 DIAGNOSIS — G4700 Insomnia, unspecified: Secondary | ICD-10-CM

## 2019-07-23 NOTE — Telephone Encounter (Signed)
Requested medication (s) are due for refill today- yes  Requested medication (s) are on the active medication list -yes  Future visit scheduled -yes  Last refill: 06/25/19  Notes to clinic: Request for non delegated Rx  Requested Prescriptions  Pending Prescriptions Disp Refills   zolpidem (AMBIEN) 10 MG tablet [Pharmacy Med Name: ZOLPIDEM TARTRATE 10 MG TAB] 30 tablet     Sig: TAKE 1 TABLET BY MOUTH AT BEDTIME      Not Delegated - Psychiatry:  Anxiolytics/Hypnotics Failed - 07/23/2019 12:13 PM      Failed - This refill cannot be delegated      Failed - Urine Drug Screen completed in last 360 days.      Passed - Valid encounter within last 6 months    Recent Outpatient Visits           2 months ago Essential (primary) hypertension   [facility] Jerrol Banana., MD   8 months ago Prostate cancer [facility])   [facility] Jerrol Banana., MD   11 months ago [place] Jerrol Banana., MD   1 year ago Neck pain   [facility] Jerrol Banana., MD   1 year ago Essential (primary) hypertension   [facility] Jerrol Banana., MD       Future Appointments             In 2 weeks Jerrol Banana., MD [facility]                Requested Prescriptions  Pending Prescriptions Disp Refills   zolpidem (AMBIEN) 10 MG tablet [Pharmacy Med Name: ZOLPIDEM TARTRATE 10 MG TAB] 30 tablet     Sig: TAKE 1 TABLET BY MOUTH AT BEDTIME      Not Delegated - Psychiatry:  Anxiolytics/Hypnotics Failed - 07/23/2019 12:13 PM      Failed - This refill cannot be delegated      Failed - Urine Drug Screen completed in last 360 days.      Passed - Valid encounter within last 6 months    Recent Outpatient Visits           2 months ago Essential (primary) hypertension   [facility] Jerrol Banana., MD   8 months ago Prostate  cancer [facility])   [facility] Jerrol Banana., MD   11 months ago [place] Jerrol Banana., MD   1 year ago Neck pain   [facility] Jerrol Banana., MD   1 year ago Essential (primary) hypertension   [facility] Jerrol Banana., MD       Future Appointments             In 2 weeks Jerrol Banana., MD [facility], [place]

## 2019-08-07 NOTE — Progress Notes (Signed)
Established patient visit  I,[name],acting as a scribe for [name], MD.,have documented all relevant documentation on the behalf of [name], MD,as directed by  [name], MD while in the presence of [name], MD.   Patient: Wesley Small   DOB: 02/23/1935   84 y.o. Male  MRN: [id number] Visit Date: 08/08/2019  Today's healthcare provider: [name], MD   Chief Complaint  Patient presents with  . Follow-up  . Hypertension   Subjective    HPI  Blood pressure also up at home somewhat. Hypertension, follow-up  BP Readings from Last 3 Encounters:  08/08/19 (!) 164/93  05/09/19 (!) 164/85  11/08/18 (!) 146/89   Wt Readings from Last 3 Encounters:  08/08/19 174 lb (78.9 kg)  05/09/19 172 lb 3.2 oz (78.1 kg)  11/08/18 173 lb (78.5 kg)     He was last seen for hypertension 3 months ago.  BP at that visit was 164/85.     Management since that visit includes; Check blood pressure at home twice a week and write down readings. Losartan and metoprolol He reports good compliance with treatment. He is not having side effects. none He is exercising. He is adherent to low salt diet.   Outside blood pressures are 167/83-133/68.                           Marland Kitchen He does not smoke.  Use of agents associated with hypertension: none.   --------------------------------------------------------------------  Insomnia, persistent From 05/09/2019-Advised patient I would love for him to stop the zolpidem.  He states he must have it.  I think he has been on it since before he moved here after his first wife died.  Family History  Problem Relation Age of Onset  . Heart disease Father        Medications: Outpatient Medications Prior to Visit  Medication Sig  . acetaminophen (TYLENOL) 500 MG tablet Take 500 mg every 6 (six) hours as needed by mouth for moderate pain or headache.   Marland Kitchen ascorbic acid (VITAMIN C) 500 MG tablet Take 500 mg  by mouth daily.   Marland Kitchen aspirin 81 MG tablet Take 81 mg by mouth 2 (two) times daily.   Marland Kitchen atorvastatin (LIPITOR) 10 MG tablet TAKE 10 MG BY MOUTH EVERY DAY AT BEDTIME  . Cholecalciferol (VITAMIN D3) 2000 units TABS Take 2,000 Units by mouth daily.  . Coenzyme Q10 100 MG capsule Take 100 mg daily by mouth.   . losartan (COZAAR) 100 MG tablet TAKE 1 TABLET BY MOUTH ONCE DAILY  . Magnesium Oxide 400 MG CAPS Take 400 mg by mouth daily.   . Omega-3 Fatty Acids (SALMON OIL-1000 PO) Take 1,000 mg by mouth daily.  Marland Kitchen omeprazole (PRILOSEC) 20 MG capsule Take 20 mg by mouth at bedtime  . traMADol (ULTRAM) 50 MG tablet TAKE 1 TABLET BY MOUTH TWICE (2) DAILY AS NEEDED FOR PAIN  . vitamin B-12 (CYANOCOBALAMIN) 500 MCG tablet Take 500 mcg by mouth daily.  . Zinc 50 MG CAPS Take by mouth.  . zolpidem (AMBIEN) 10 MG tablet TAKE 1 TABLET BY MOUTH AT BEDTIME  . [DISCONTINUED] metoprolol succinate (TOPROL-XL) 25 MG 24 hr tablet Take 25 mg daily by mouth.   . hydrocortisone 2.5 % cream Apply topically 2 (two) times daily. (Patient not taking: Reported on 02/27/2018)  . Naproxen 375 MG TBEC Take 1 tablet (375 mg total) by  mouth 2 (two) times daily as needed. (Patient not taking: Reported on 02/27/2018)  . NON FORMULARY Apply 1 mL topically every other day. Testosterone 20% 90gm  (Patient not taking: Reported on 08/08/2019)   No facility-administered medications prior to visit.    Review of Systems  Constitutional: Negative for appetite change, chills and fever.  Respiratory: Negative for chest tightness, shortness of breath and wheezing.   Cardiovascular: Negative for chest pain and palpitations.  Gastrointestinal: Negative for abdominal pain, nausea and vomiting.       Objective    BP (!) 164/93 (BP Location: Right Arm, Cuff Size: Large)   Pulse 65   Temp 98.4 F (36.9 C) (Other (Comment))   Resp 16   Ht 5\' 10"  (1.778 m)   Wt 174 lb (78.9 kg)   SpO2 95%   BMI 24.97 kg/m  BP Readings from Last 3  Encounters:  08/08/19 (!) 164/93  05/09/19 (!) 164/85  11/08/18 (!) 146/89   Wt Readings from Last 3 Encounters:  08/08/19 174 lb (78.9 kg)  05/09/19 172 lb 3.2 oz (78.1 kg)  11/08/18 173 lb (78.5 kg)      Physical Exam  BP (!) 164/93 (BP Location: Right Arm, Cuff Size: Large)   Pulse 65   Temp 98.4 F (36.9 C) (Other (Comment))   Resp 16   Ht 5\' 10"  (1.778 m)   Wt 174 lb (78.9 kg)   SpO2 95%   BMI 24.97 kg/m   General Appearance:    Alert, cooperative, no distress, appears stated age  Head:    Normocephalic, without obvious abnormality, atraumatic  Eyes:    PERRL, conjunctiva/corneas clear, EOM's intact, fundi    benign, both eyes       Ears:    Normal TM's and external ear canals, both ears  Nose:   Nares normal, septum midline, mucosa normal, no drainage   or sinus tenderness  Throat:   Lips, mucosa, and tongue normal; teeth and gums normal  Neck:   Supple, symmetrical, trachea midline, no adenopathy;       thyroid:  No enlargement/tenderness/nodules; no carotid   bruit or JVD  Back:     Symmetric, no curvature, ROM normal, no CVA tenderness  Lungs:     Clear to auscultation bilaterally, respirations unlabored  Chest wall:    No tenderness or deformity  Heart:    Regular rate and rhythm, S1 and S2 normal, no murmur, rub   or gallop  Abdomen:     Soft, non-tender, bowel sounds active all four quadrants,    no masses, no organomegaly  Genitalia:    Normal male without lesion, discharge or tenderness  Rectal:    Normal tone, normal prostate, no masses or tenderness;   guaiac negative stool  Extremities:   Extremities normal, atraumatic, no cyanosis or edema  Pulses:   2+ and symmetric all extremities  Skin:   Skin color, texture, turgor normal, no rashes or lesions  Lymph nodes:   Cervical, supraclavicular, and axillary nodes normal  Neurologic:   CNII-XII intact. Normal strength, sensation and reflexes      throughout    No results found for any visits on  08/08/19.  Assessment & Plan     1. Essential (primary) hypertension Increased metoprolol from 25 mg to 50 mg qd.  Follow-up 1 to 2 months. - Metoprolol Succinate 50 MG CS24; Take 50 mg by mouth daily.  Dispense: 30 capsule; Refill: 1  2. Hypercholesterolemia   3. Insomnia,  persistent Patient overall still has chronic insomnia.  Is a still needs the zolpidem.   Return in about 2 months (around 10/09/2019).         Jacoria Keiffer Cranford Mon, MD  [facility] [phone] (phone) [phone] (fax)  [place]

## 2019-08-08 ENCOUNTER — Other Ambulatory Visit: Payer: Self-pay

## 2019-08-08 ENCOUNTER — Encounter: Payer: Self-pay | Admitting: Family Medicine

## 2019-08-08 ENCOUNTER — Ambulatory Visit (INDEPENDENT_AMBULATORY_CARE_PROVIDER_SITE_OTHER): Payer: Medicare Other | Admitting: Family Medicine

## 2019-08-08 VITALS — BP 164/93 | HR 65 | Temp 98.4°F | Resp 16 | Ht 70.0 in | Wt 174.0 lb

## 2019-08-08 DIAGNOSIS — G4700 Insomnia, unspecified: Secondary | ICD-10-CM

## 2019-08-08 DIAGNOSIS — E7800 Pure hypercholesterolemia, unspecified: Secondary | ICD-10-CM

## 2019-08-08 DIAGNOSIS — I10 Essential (primary) hypertension: Secondary | ICD-10-CM

## 2019-08-08 MED ORDER — METOPROLOL SUCCINATE 50 MG PO CS24: 50.0000 mg | EXTENDED_RELEASE_CAPSULE | Freq: Every day | ORAL | 1 refills | Status: DC

## 2019-08-15 ENCOUNTER — Other Ambulatory Visit: Payer: Self-pay | Admitting: Family Medicine

## 2019-09-11 NOTE — Progress Notes (Signed)
Subjective:   Wesley Small is a 84 y.o. male who presents for Medicare Annual/Subsequent preventive examination.  I connected with Dariel Betzer today by telephone and verified that I am speaking with the correct person using two identifiers. Location patient: home Location provider: work Persons participating in the virtual visit: patient, provider.   I discussed the limitations, risks, security and privacy concerns of performing an evaluation and management service by telephone and the availability of in person appointments. I also discussed with the patient that there may be a patient responsible charge related to this service. The patient expressed understanding and verbally consented to this telephonic visit.    Interactive audio and video telecommunications were attempted between this provider and patient, however failed, due to patient having technical difficulties OR patient did not have access to video capability.  We continued and completed visit with audio only.   Review of Systems    N/A  Cardiac Risk Factors include: advanced age (>61men, >79 women);male gender;dyslipidemia;hypertension     Objective:    There were no vitals filed for this visit. There is no height or weight on file to calculate BMI.  Advanced Directives 09/12/2019 09/10/2018 08/15/2017 12/29/2016 11/24/2016 08/02/2016  Does Patient Have a Medical Advance Directive? Yes Yes Yes Yes No;Yes Yes  Type of Paramedic of [place];Living will [place];Living will [place];Living will [place];Living will [place];Living will Living will;Healthcare Power of Attorney  Does patient want to make changes to medical advance directive? - - - No - Patient declined - -  Copy of [place] in Chart? No - copy requested No - copy requested No - copy requested Yes - No - copy requested  Would patient  like information on creating a medical advance directive? - - - - No - Patient declined -    Current Medications (verified) Outpatient Encounter Medications as of 09/12/2019  Medication Sig  . acetaminophen (TYLENOL) 500 MG tablet Take 500 mg every 6 (six) hours as needed by mouth for moderate pain or headache.   Marland Kitchen ascorbic acid (VITAMIN C) 500 MG tablet Take 500 mg by mouth daily.   Marland Kitchen aspirin 81 MG tablet Take 81 mg by mouth daily.   Marland Kitchen atorvastatin (LIPITOR) 10 MG tablet TAKE 1 TABLET BY MOUTH AT BEDTIME  . b complex vitamins tablet Take 1 tablet by mouth daily.  . Cholecalciferol (VITAMIN D3) 2000 units TABS Take 2,000 Units by mouth daily.  . Coenzyme Q10 100 MG capsule Take 100 mg daily by mouth.   . losartan (COZAAR) 100 MG tablet TAKE 1 TABLET BY MOUTH ONCE DAILY  . Magnesium Oxide 400 MG CAPS Take 400 mg by mouth daily.   . Metoprolol Succinate 50 MG CS24 Take 50 mg by mouth daily. (Patient taking differently: Take 25 mg by mouth daily. )  . Omega-3 Fatty Acids (SALMON OIL-1000 PO) Take 1,000 mg by mouth daily.  Marland Kitchen omeprazole (PRILOSEC) 20 MG capsule Take 20 mg by mouth at bedtime  . traMADol (ULTRAM) 50 MG tablet TAKE 1 TABLET BY MOUTH TWICE (2) DAILY AS NEEDED FOR PAIN  . Zinc 50 MG CAPS Take by mouth every other day.   . zolpidem (AMBIEN) 10 MG tablet TAKE 1 TABLET BY MOUTH AT BEDTIME  . hydrocortisone 2.5 % cream Apply topically 2 (two) times daily. (Patient not taking: Reported on 02/27/2018)  . Naproxen 375 MG TBEC Take 1 tablet (375  mg total) by mouth 2 (two) times daily as needed. (Patient not taking: Reported on 02/27/2018)  . NON FORMULARY Apply 1 mL topically every other day. Testosterone 20% 90gm  (Patient not taking: Reported on 08/08/2019)  . vitamin B-12 (CYANOCOBALAMIN) 500 MCG tablet Take 500 mcg by mouth daily. (Patient not taking: Reported on 09/12/2019)   No facility-administered encounter medications on file as of 09/12/2019.    Allergies (verified) Penicillins    History: Past Medical History:  Diagnosis Date  . Arthritis   . Dysrhythmia    RBBB  . GERD (gastroesophageal reflux disease)   . History of blood transfusion   . History of diverticulitis   . History of hiatal hernia   . HOH (hard of hearing)   . Hypercholesterolemia   . Hypertension   . Presence of permanent cardiac pacemaker   . Sleep apnea    Past Surgical History:  Procedure Laterality Date  . back injection    . CATARACT EXTRACTION W/PHACO Right 11/24/2016   Procedure: CATARACT EXTRACTION PHACO AND INTRAOCULAR LENS PLACEMENT (IOC);  Surgeon: Eulogio Bear, MD;  Location: [facility] ORS;  Service: Ophthalmology;  Laterality: Right;  Lot # [id number] [place]:   00.31  AP%: 10.9 CDE:  3.39  . CATARACT EXTRACTION W/PHACO Left 12/29/2016   Procedure: CATARACT EXTRACTION PHACO AND INTRAOCULAR LENS PLACEMENT (IOC);  Surgeon: Eulogio Bear, MD;  Location: [facility] ORS;  Service: Ophthalmology;  Laterality: Left;  [place] 00:33.8 AP% 8.5 CDE 2.86 Fluid Pack lot # [id number]  . INJECTION KNEE    . INJECTION KNEE    . PACEMAKER INSERTION    . SKIN GRAFT    . TONSILLECTOMY AND ADENOIDECTOMY    . TRANSURETHRAL RESECTION OF PROSTATE     Family History  Problem Relation Age of Onset  . Heart disease Father    Social History   Socioeconomic History  . Marital status: Widowed    Spouse name: Not on file  . Number of children: 1  . Years of education: Not on file  . Highest education level: Some college, no degree  Occupational History  . Occupation: retired  Tobacco Use  . Smoking status: Former Smoker    Types: Cigarettes  . Smokeless tobacco: Never Used  . Tobacco comment: quit around 1970  Vaping Use  . Vaping Use: Never used  Substance and Sexual Activity  . Alcohol use: Yes    Alcohol/week: 7.0 standard drinks    Types: 7 Shots of liquor per week    Comment: 2 oz a day of high ball "bourbon"  . Drug use: No  . Sexual activity: Not on file  Other Topics Concern  . Not  on file  Social History Narrative  . Not on file   Social Determinants of Health   Financial Resource Strain: Low Risk   . Difficulty of Paying Living Expenses: Not hard at all  Food Insecurity: No Food Insecurity  . Worried About Charity fundraiser in the Last Year: Never true  . Ran Out of Food in the Last Year: Never true  Transportation Needs: No Transportation Needs  . Lack of Transportation (Medical): No  . Lack of Transportation (Non-Medical): No  Physical Activity: Inactive  . Days of Exercise per Week: 0 days  . Minutes of Exercise per Session: 0 min  Stress: No Stress Concern Present  . Feeling of Stress : Only a little  Social Connections: Socially Isolated  . Frequency of Communication with Friends and Family: More  than three times a week  . Frequency of Social Gatherings with Friends and Family: More than three times a week  . Attends Religious Services: Never  . Active Member of Clubs or Organizations: No  . Attends Archivist Meetings: Never  . Marital Status: Widowed    Tobacco Counseling Counseling given: Not Answered Comment: quit around 1970   Clinical Intake:  Pre-visit preparation completed: Yes  Pain : No/denies pain     Nutritional Risks: None Diabetes: No  How often do you need to have someone help you when you read instructions, pamphlets, or other written materials from your doctor or pharmacy?: 1 - Never  Diabetic? No  Interpreter Needed?: No  Information entered by :: [facility], LPN   Activities of Daily Living In your present state of health, do you have any difficulty performing the following activities: 09/12/2019  Hearing? Y  Comment Wears bilateral hearing aids.  Vision? N  Difficulty concentrating or making decisions? N  Walking or climbing stairs? N  Dressing or bathing? N  Doing errands, shopping? N  Preparing Food and eating ? N  Using the Toilet? N  In the past six months, have you accidently leaked urine?  N  Do you have problems with loss of bowel control? N  Managing your Medications? N  Managing your Finances? N  Housekeeping or managing your Housekeeping? N  Some recent data might be hidden    Patient Care Team: Jerrol Banana., MD as PCP - General (Family Medicine) Eulogio Bear, MD as Consulting Physician (Ophthalmology) Thornton Park, MD as Referring Physician (Orthopedic Surgery) Dimmig, Marcello Moores, MD as Referring Physician (Orthopedic Surgery) Teodoro Spray, MD as Consulting Physician (Cardiology) Ralene Bathe, MD as Consulting Physician (Dermatology)  Indicate any recent Medical Services you may have received from other than [facility] providers in the past year (date may be approximate).     Assessment:   This is a routine wellness examination for Dejaun.  Hearing/Vision screen No exam data present  Dietary issues and exercise activities discussed: Current Exercise Habits: The patient does not participate in regular exercise at present, Exercise limited by: orthopedic condition(s)  Goals    . DIET - INCREASE WATER INTAKE     Recommend increasing water intake to 4 glasses a day.       Depression Screen PHQ 2/9 Scores 09/12/2019 09/10/2018 11/23/2017 08/15/2017 08/02/2016 07/30/2015 11/10/2014  PHQ - 2 Score 0 0 0 0 0 0 0  PHQ- 9 Score - - 1 - - - -    Fall Risk Fall Risk  09/12/2019 11/08/2018 09/10/2018 08/15/2017 08/02/2016  Falls in the past year? 0 0 0 No No  Number falls in past yr: 0 0 - - -  Injury with Fall? 0 0 - - -  Follow up - Falls evaluation completed - - -    Any stairs in or around the home? Yes  If so, are there any without handrails? No  Home free of loose throw rugs in walkways, pet beds, electrical cords, etc? Yes  Adequate lighting in your home to reduce risk of falls? Yes   ASSISTIVE DEVICES UTILIZED TO PREVENT FALLS:  Life alert? No  Use of a cane, walker or w/c? No  Grab bars in the bathroom? No  Shower chair or bench in  shower? Yes  Elevated toilet seat or a handicapped toilet? No    Cognitive Function:     6CIT Screen 09/12/2019 09/10/2018 08/02/2016  What Year?  0 points 0 points 0 points  What month? 0 points 0 points 0 points  What time? 0 points 0 points 0 points  Count back from 20 0 points 0 points 0 points  Months in reverse 2 points 0 points 0 points  Repeat phrase 0 points 0 points 0 points  Total Score 2 0 0    Immunizations Immunization History  Administered Date(s) Administered  . Influenza, High Dose Seasonal PF 11/10/2014, 11/04/2015, 10/09/2017, 10/19/2018  . PFIZER SARS-COV-2 Vaccination 02/10/2019, 03/05/2019  . Pneumococcal Conjugate-13 05/05/2014  . Pneumococcal Polysaccharide-23 08/02/2016  . Tdap 10/12/2010  . Zoster Recombinat (Shingrix) 11/10/2016, 01/26/2017    TDAP status: Up to date Flu Vaccine status: Up to date Pneumococcal vaccine status: Up to date Covid-19 vaccine status: Completed vaccines  Qualifies for Shingles Vaccine? Yes   Zostavax completed No   Shingrix Completed?: Yes  Screening Tests Health Maintenance  Topic Date Due  . INFLUENZA VACCINE  08/11/2019  . TETANUS/TDAP  10/11/2020  . COVID-19 Vaccine  Completed  . PNA vac Low Risk Adult  Completed    Health Maintenance  Health Maintenance Due  Topic Date Due  . INFLUENZA VACCINE  08/11/2019    Colorectal cancer screening: No longer required.   Lung Cancer Screening: (Low Dose CT Chest recommended if Age 41-80 years, 30 pack-year currently smoking OR have quit w/in 15years.) does not qualify.    Additional Screening:  Vision Screening: Recommended annual ophthalmology exams for early detection of glaucoma and other disorders of the eye. Is the patient up to date with their annual eye exam?  Yes  Who is the provider or what is the name of the office in which the patient attends annual eye exams? Dr. Edison Pace @ [facility] If pt is not established with a provider, would they like to be referred to a  provider to establish care? No .   Dental Screening: Recommended annual dental exams for proper oral hygiene  Community Resource Referral / Chronic Care Management: CRR required this visit?  No   CCM required this visit?  No      Plan:     I have personally reviewed and noted the following in the patient's chart:   . Medical and social history . Use of alcohol, tobacco or illicit drugs  . Current medications and supplements . Functional ability and status . Nutritional status . Physical activity . Advanced directives . List of other physicians . Hospitalizations, surgeries, and ER visits in previous 12 months . Vitals . Screenings to include cognitive, depression, and falls . Referrals and appointments  In addition, I have reviewed and discussed with patient certain preventive protocols, quality metrics, and best practice recommendations. A written personalized care plan for preventive services as well as general preventive health recommendations were provided to patient.     Cornie Herrington [place], [place]   09/13/7167   Nurse Notes: Due for a Flu shot when available.

## 2019-09-12 ENCOUNTER — Ambulatory Visit (INDEPENDENT_AMBULATORY_CARE_PROVIDER_SITE_OTHER): Payer: Medicare Other

## 2019-09-12 ENCOUNTER — Other Ambulatory Visit: Payer: Self-pay

## 2019-09-12 DIAGNOSIS — Z0000 Encounter for general adult medical examination without abnormal findings: Secondary | ICD-10-CM | POA: Diagnosis not present

## 2019-09-12 NOTE — Patient Instructions (Signed)
[name] , Thank you for taking time to come for your Medicare Wellness Visit. I appreciate your ongoing commitment to your health goals. Please review the following plan we discussed and let me know if I can assist you in the future.   Screening recommendations/referrals: Colonoscopy: No longer required.  Recommended yearly ophthalmology/optometry visit for glaucoma screening and checkup Recommended yearly dental visit for hygiene and checkup  Vaccinations: Influenza vaccine: Due fall 2021 Pneumococcal vaccine: Completed series Tdap vaccine: Up to date, due 10/2020 Shingles vaccine: Completed series    Advanced directives: Please bring a copy of your POA (Power of Attorney) and/or Living Will to your next appointment.   Conditions/risks identified: Continue to increase water intake to 6-8 8 oz glasses a day.  Next appointment: 10/09/19 @ 2:20 PM with Dr Wesley Small   Preventive Care 74 Years and Older, Male Preventive care refers to lifestyle choices and visits with your health care provider that can promote health and wellness. What does preventive care include?  A yearly physical exam. This is also called an annual well check.  Dental exams once or twice a year.  Routine eye exams. Ask your health care provider how often you should have your eyes checked.  Personal lifestyle choices, including:  Daily care of your teeth and gums.  Regular physical activity.  Eating a healthy diet.  Avoiding tobacco and drug use.  Limiting alcohol use.  Practicing safe sex.  Taking low doses of aspirin every day.  Taking vitamin and mineral supplements as recommended by your health care provider. What happens during an annual well check? The services and screenings done by your health care provider during your annual well check will depend on your age, overall health, lifestyle risk factors, and family history of disease. Counseling  Your health care provider may ask you questions  about your:  Alcohol use.  Tobacco use.  Drug use.  Emotional well-being.  Home and relationship well-being.  Sexual activity.  Eating habits.  History of falls.  Memory and ability to understand (cognition).  Work and work Statistician. Screening  You may have the following tests or measurements:  Height, weight, and BMI.  Blood pressure.  Lipid and cholesterol levels. These may be checked every 5 years, or more frequently if you are over 33 years old.  Skin check.  Lung cancer screening. You may have this screening every year starting at age 35 if you have a 30-pack-year history of smoking and currently smoke or have quit within the past 15 years.  Fecal occult blood test (FOBT) of the stool. You may have this test every year starting at age 60.  Flexible sigmoidoscopy or colonoscopy. You may have a sigmoidoscopy every 5 years or a colonoscopy every 10 years starting at age 10.  Prostate cancer screening. Recommendations will vary depending on your family history and other risks.  Hepatitis C blood test.  Hepatitis B blood test.  Sexually transmitted disease (STD) testing.  Diabetes screening. This is done by checking your blood sugar (glucose) after you have not eaten for a while (fasting). You may have this done every 1-3 years.  Abdominal aortic aneurysm (AAA) screening. You may need this if you are a current or former smoker.  Osteoporosis. You may be screened starting at age 8 if you are at high risk. Talk with your health care provider about your test results, treatment options, and if necessary, the need for more tests. Vaccines  Your health care provider may recommend certain vaccines,  such as:  Influenza vaccine. This is recommended every year.  Tetanus, diphtheria, and acellular pertussis (Tdap, Td) vaccine. You may need a Td booster every 10 years.  Zoster vaccine. You may need this after age 38.  Pneumococcal 13-valent conjugate (PCV13)  vaccine. One dose is recommended after age 29.  Pneumococcal polysaccharide (PPSV23) vaccine. One dose is recommended after age 37. Talk to your health care provider about which screenings and vaccines you need and how often you need them. This information is not intended to replace advice given to you by your health care provider. Make sure you discuss any questions you have with your health care provider. Document Released: 01/23/2015 Document Revised: 09/16/2015 Document Reviewed: 10/28/2014 Elsevier Interactive Patient Education  2017 [place] Prevention in the Home Falls can cause injuries. They can happen to people of all ages. There are many things you can do to make your home safe and to help prevent falls. What can I do on the outside of my home?  Regularly fix the edges of walkways and driveways and fix any cracks.  Remove anything that might make you trip as you walk through a door, such as a raised step or threshold.  Trim any bushes or trees on the path to your home.  Use bright outdoor lighting.  Clear any walking paths of anything that might make someone trip, such as rocks or tools.  Regularly check to see if handrails are loose or broken. Make sure that both sides of any steps have handrails.  Any raised decks and porches should have guardrails on the edges.  Have any leaves, snow, or ice cleared regularly.  Use sand or salt on walking paths during winter.  Clean up any spills in your garage right away. This includes oil or grease spills. What can I do in the bathroom?  Use night lights.  Install grab bars by the toilet and in the tub and shower. Do not use towel bars as grab bars.  Use non-skid mats or decals in the tub or shower.  If you need to sit down in the shower, use a plastic, non-slip stool.  Keep the floor dry. Clean up any water that spills on the floor as soon as it happens.  Remove soap buildup in the tub or shower  regularly.  Attach bath mats securely with double-sided non-slip rug tape.  Do not have throw rugs and other things on the floor that can make you trip. What can I do in the bedroom?  Use night lights.  Make sure that you have a light by your bed that is easy to reach.  Do not use any sheets or blankets that are too big for your bed. They should not hang down onto the floor.  Have a firm chair that has side arms. You can use this for support while you get dressed.  Do not have throw rugs and other things on the floor that can make you trip. What can I do in the kitchen?  Clean up any spills right away.  Avoid walking on wet floors.  Keep items that you use a lot in easy-to-reach places.  If you need to reach something above you, use a strong step stool that has a grab bar.  Keep electrical cords out of the way.  Do not use floor polish or wax that makes floors slippery. If you must use wax, use non-skid floor wax.  Do not have throw rugs and other things on  the floor that can make you trip. What can I do with my stairs?  Do not leave any items on the stairs.  Make sure that there are handrails on both sides of the stairs and use them. Fix handrails that are broken or loose. Make sure that handrails are as long as the stairways.  Check any carpeting to make sure that it is firmly attached to the stairs. Fix any carpet that is loose or worn.  Avoid having throw rugs at the top or bottom of the stairs. If you do have throw rugs, attach them to the floor with carpet tape.  Make sure that you have a light switch at the top of the stairs and the bottom of the stairs. If you do not have them, ask someone to add them for you. What else can I do to help prevent falls?  Wear shoes that:  Do not have high heels.  Have rubber bottoms.  Are comfortable and fit you well.  Are closed at the toe. Do not wear sandals.  If you use a stepladder:  Make sure that it is fully  opened. Do not climb a closed stepladder.  Make sure that both sides of the stepladder are locked into place.  Ask someone to hold it for you, if possible.  Clearly mark and make sure that you can see:  Any grab bars or handrails.  First and last steps.  Where the edge of each step is.  Use tools that help you move around (mobility aids) if they are needed. These include:  Canes.  Walkers.  Scooters.  Crutches.  Turn on the lights when you go into a dark area. Replace any light bulbs as soon as they burn out.  Set up your furniture so you have a clear path. Avoid moving your furniture around.  If any of your floors are uneven, fix them.  If there are any pets around you, be aware of where they are.  Review your medicines with your doctor. Some medicines can make you feel dizzy. This can increase your chance of falling. Ask your doctor what other things that you can do to help prevent falls. This information is not intended to replace advice given to you by your health care provider. Make sure you discuss any questions you have with your health care provider. Document Released: 10/23/2008 Document Revised: 06/04/2015 Document Reviewed: 01/31/2014 Elsevier Interactive Patient Education  2017 [employer].

## 2019-10-02 ENCOUNTER — Other Ambulatory Visit: Payer: Self-pay | Admitting: Family Medicine

## 2019-10-02 NOTE — Telephone Encounter (Signed)
Requested medication (s) are due for refill today: Due 10/15/19  Requested medication (s) are on the active medication list: yes  Last refill: 05/09/19 #60   5 refills  Future visit scheduled Yes  10/09/19  Notes to clinic: not delegated  Requested Prescriptions  Pending Prescriptions Disp Refills   traMADol (ULTRAM) 50 MG tablet [Pharmacy Med Name: TRAMADOL HCL 50 MG TAB] 60 tablet     Sig: TAKE 1 TABLET BY MOUTH TWICE (2) DAILY AS NEEDED FOR PAIN      Not Delegated - Analgesics:  Opioid Agonists Failed - 10/02/2019  1:11 PM      Failed - This refill cannot be delegated      Failed - Urine Drug Screen completed in last 360 days.      Passed - Valid encounter within last 6 months    Recent Outpatient Visits           1 month ago Essential (primary) hypertension   [facility] Jerrol Banana., MD   4 months ago Essential (primary) hypertension   [facility] Jerrol Banana., MD   10 months ago Prostate cancer [facility])   [facility] Jerrol Banana., MD   1 year ago [facility] Jerrol Banana., MD   1 year ago Neck pain   [facility] Jerrol Banana., MD       Future Appointments             In 1 week Jerrol Banana., MD [facility] [place]

## 2019-10-03 DIAGNOSIS — Z23 Encounter for immunization: Secondary | ICD-10-CM | POA: Diagnosis not present

## 2019-10-09 ENCOUNTER — Other Ambulatory Visit: Payer: Self-pay

## 2019-10-09 ENCOUNTER — Ambulatory Visit (INDEPENDENT_AMBULATORY_CARE_PROVIDER_SITE_OTHER): Payer: Medicare Other | Admitting: Family Medicine

## 2019-10-09 ENCOUNTER — Encounter: Payer: Self-pay | Admitting: Family Medicine

## 2019-10-09 VITALS — BP 165/93 | HR 67 | Temp 98.2°F | Ht 70.0 in | Wt 175.0 lb

## 2019-10-09 DIAGNOSIS — G4700 Insomnia, unspecified: Secondary | ICD-10-CM

## 2019-10-09 DIAGNOSIS — E7800 Pure hypercholesterolemia, unspecified: Secondary | ICD-10-CM

## 2019-10-09 DIAGNOSIS — C61 Malignant neoplasm of prostate: Secondary | ICD-10-CM

## 2019-10-09 DIAGNOSIS — I10 Essential (primary) hypertension: Secondary | ICD-10-CM | POA: Diagnosis not present

## 2019-10-09 NOTE — Progress Notes (Signed)
Established patient visit   Patient: Wesley Small   DOB: [birth year]   84 y.o. Male  MRN: [id number] Visit Date: 10/09/2019  Today's healthcare provider: [name], MD   Chief Complaint  Patient presents with  . Hypertension   Subjective    HPI  Patient is back to his original Toprol dose as he did not feel well when we doubled it.  He says his blood pressures at home runs pretty good.  They are usually 130s to 140s over 70s Hypertension, follow-up  BP Readings from Last 3 Encounters:  10/09/19 (!) 165/93  08/08/19 (!) 164/93  05/09/19 (!) 164/85   Wt Readings from Last 3 Encounters:  10/09/19 175 lb (79.4 kg)  08/08/19 174 lb (78.9 kg)  05/09/19 172 lb 3.2 oz (78.1 kg)     He was last seen for hypertension 2 months ago.  BP at that visit was 164/93. Management since that visit includes; Increased metoprolol from 25 mg to 50 mg qd.  Follow-up 1 to 2 months. He reports excellent compliance with treatment. He is not having side effects.  He is exercising. He is adherent to low salt diet.   Outside blood pressures are 142/78.  He does not smoke.  Use of agents associated with hypertension: none.   ---------------------------------------------------------------------------------------------------  Social History   Tobacco Use  . Smoking status: Former Smoker    Types: Cigarettes  . Smokeless tobacco: Never Used  . Tobacco comment: quit around 1970  Vaping Use  . Vaping Use: Never used  Substance Use Topics  . Alcohol use: Yes    Alcohol/week: 7.0 standard drinks    Types: 7 Shots of liquor per week    Comment: 2 oz a day of high ball "bourbon"  . Drug use: No       Medications: Outpatient Medications Prior to Visit  Medication Sig  . ascorbic acid (VITAMIN C) 500 MG tablet Take 500 mg by mouth daily.   Marland Kitchen aspirin 81 MG tablet Take 81 mg by mouth daily.   Marland Kitchen atorvastatin (LIPITOR) 10 MG tablet TAKE 1 TABLET BY MOUTH AT BEDTIME  . b  complex vitamins tablet Take 1 tablet by mouth daily.  . Cholecalciferol (VITAMIN D3) 2000 units TABS Take 2,000 Units by mouth daily.  . Coenzyme Q10 100 MG capsule Take 100 mg daily by mouth.   . losartan (COZAAR) 100 MG tablet TAKE 1 TABLET BY MOUTH ONCE DAILY  . Magnesium Oxide 400 MG CAPS Take 400 mg by mouth daily.   . Metoprolol Succinate 50 MG CS24 Take 50 mg by mouth daily. (Patient taking differently: Take 25 mg by mouth daily. )  . Naproxen 375 MG TBEC Take 1 tablet (375 mg total) by mouth 2 (two) times daily as needed.  . NON FORMULARY Apply 1 mL topically every other day. Testosterone 20% 90gm   . Omega-3 Fatty Acids (SALMON OIL-1000 PO) Take 1,000 mg by mouth daily.  Marland Kitchen omeprazole (PRILOSEC) 20 MG capsule Take 20 mg by mouth at bedtime  . traMADol (ULTRAM) 50 MG tablet TAKE 1 TABLET BY MOUTH TWICE (2) DAILY AS NEEDED FOR PAIN  . vitamin B-12 (CYANOCOBALAMIN) 500 MCG tablet Take 500 mcg by mouth daily.   . Zinc 50 MG CAPS Take by mouth every other day.   . zolpidem (AMBIEN) 10 MG tablet TAKE 1 TABLET BY MOUTH AT BEDTIME  . acetaminophen (TYLENOL) 500 MG tablet Take 500 mg every 6 (six) hours as needed  by mouth for moderate pain or headache.  (Patient not taking: Reported on 10/09/2019)  . hydrocortisone 2.5 % cream Apply topically 2 (two) times daily. (Patient not taking: Reported on 02/27/2018)   No facility-administered medications prior to visit.    Review of Systems  Constitutional: Negative for appetite change, chills and fever.  Respiratory: Negative for chest tightness, shortness of breath and wheezing.   Cardiovascular: Negative for chest pain and palpitations.  Gastrointestinal: Negative for abdominal pain, nausea and vomiting.       Objective    BP (!) 165/93 (BP Location: Right Arm, Patient Position: Sitting, Cuff Size: Normal)   Pulse 67   Temp 98.2 F (36.8 C) (Oral)   Ht 5\' 10"  (1.778 m)   Wt 175 lb (79.4 kg)   BMI 25.11 kg/m  BP Readings from Last 3  Encounters:  10/09/19 (!) 165/93  08/08/19 (!) 164/93  05/09/19 (!) 164/85   Wt Readings from Last 3 Encounters:  10/09/19 175 lb (79.4 kg)  08/08/19 174 lb (78.9 kg)  05/09/19 172 lb 3.2 oz (78.1 kg)      Physical Exam Vitals reviewed.  Constitutional:      Appearance: Normal appearance.  HENT:     Head: Normocephalic and atraumatic.     Right Ear: External ear normal.     Left Ear: External ear normal.  Eyes:     General: No scleral icterus.    Conjunctiva/sclera: Conjunctivae normal.  Neck:     Vascular: No carotid bruit.  Cardiovascular:     Rate and Rhythm: Normal rate and regular rhythm.     Pulses: Normal pulses.     Heart sounds: Normal heart sounds.  Pulmonary:     Effort: Pulmonary effort is normal.     Breath sounds: Normal breath sounds.  Abdominal:     Palpations: Abdomen is soft.  Musculoskeletal:     Cervical back: Normal range of motion.     Right lower leg: No edema.     Left lower leg: No edema.  Neurological:     General: No focal deficit present.     Mental Status: He is alert and oriented to person, place, and time.  Psychiatric:        Mood and Affect: Mood normal.        Behavior: Behavior normal.        Thought Content: Thought content normal.        Judgment: Judgment normal.       No results found for any visits on 10/09/19.  Assessment & Plan     1. Essential (primary) hypertension Patient having some whitecoat hypertension.  After discussion he wishes to avoid potential side effects as he had when we increase the Toprol.  He will follow up blood pressures at home and we will see him back early next year - CBC with Differential/Platelet - Comprehensive metabolic panel - Lipid panel  2. Hypercholesterolemia On atorvastatin - CBC with Differential/Platelet - Comprehensive metabolic panel - Lipid panel  3. Insomnia, persistent Patient continues use Ambien he states he cannot do without it - CBC with Differential/Platelet -  Comprehensive metabolic panel - Lipid panel  4.h/o Prostate cancer ([place]) History of in 2000 for prostate cancer.  No further PSAs at this time.  Patient is in agreement   No follow-ups on file.      I, [name], MD, have reviewed all documentation for this visit. The documentation on 10/10/19 for the exam, diagnosis, procedures, and  orders are all accurate and complete.    Richard Cranford Mon, MD  [facility] [phone] (phone) [phone] (fax)  [place]

## 2019-10-10 ENCOUNTER — Other Ambulatory Visit: Payer: Self-pay | Admitting: Family Medicine

## 2019-10-10 DIAGNOSIS — C61 Malignant neoplasm of prostate: Secondary | ICD-10-CM | POA: Insufficient documentation

## 2019-10-10 DIAGNOSIS — I10 Essential (primary) hypertension: Secondary | ICD-10-CM

## 2019-10-11 DIAGNOSIS — E7800 Pure hypercholesterolemia, unspecified: Secondary | ICD-10-CM | POA: Diagnosis not present

## 2019-10-11 DIAGNOSIS — G4700 Insomnia, unspecified: Secondary | ICD-10-CM | POA: Diagnosis not present

## 2019-10-11 DIAGNOSIS — I10 Essential (primary) hypertension: Secondary | ICD-10-CM | POA: Diagnosis not present

## 2019-10-12 LAB — CBC WITH DIFFERENTIAL/PLATELET
Basophils Absolute: 0.1 10*3/uL (ref 0.0–0.2)
Basos: 1 %
EOS (ABSOLUTE): 0.4 10*3/uL (ref 0.0–0.4)
Eos: 6 %
Hematocrit: 42.2 % (ref 37.5–51.0)
Hemoglobin: 14.7 g/dL (ref 13.0–17.7)
Immature Grans (Abs): 0 10*3/uL (ref 0.0–0.1)
Immature Granulocytes: 0 %
Lymphocytes Absolute: 2.1 10*3/uL (ref 0.7–3.1)
Lymphs: 33 %
MCH: 34.3 pg — ABNORMAL HIGH (ref 26.6–33.0)
MCHC: 34.8 g/dL (ref 31.5–35.7)
MCV: 98 fL — ABNORMAL HIGH (ref 79–97)
Monocytes Absolute: 0.8 10*3/uL (ref 0.1–0.9)
Monocytes: 12 %
Neutrophils Absolute: 3.1 10*3/uL (ref 1.4–7.0)
Neutrophils: 48 %
Platelets: 197 10*3/uL (ref 150–450)
RBC: 4.29 x10E6/uL (ref 4.14–5.80)
RDW: 12.7 % (ref 11.6–15.4)
WBC: 6.4 10*3/uL (ref 3.4–10.8)

## 2019-10-12 LAB — COMPREHENSIVE METABOLIC PANEL
ALT: 22 IU/L (ref 0–44)
AST: 28 IU/L (ref 0–40)
Albumin/Globulin Ratio: 2.3 — ABNORMAL HIGH (ref 1.2–2.2)
Albumin: 4.4 g/dL (ref 3.6–4.6)
Alkaline Phosphatase: 80 IU/L (ref 44–121)
BUN/Creatinine Ratio: 12 (ref 10–24)
BUN: 13 mg/dL (ref 8–27)
Bilirubin Total: 0.9 mg/dL (ref 0.0–1.2)
CO2: 25 mmol/L (ref 20–29)
Calcium: 9 mg/dL (ref 8.6–10.2)
Chloride: 103 mmol/L (ref 96–106)
Creatinine, Ser: 1.1 mg/dL (ref 0.76–1.27)
GFR calc Af Amer: 72 mL/min/{1.73_m2} (ref >59)
GFR calc non Af Amer: 62 mL/min/{1.73_m2} (ref >59)
Globulin, Total: 1.9 g/dL (ref 1.5–4.5)
Glucose: 86 mg/dL (ref 65–99)
Potassium: 4 mmol/L (ref 3.5–5.2)
Sodium: 142 mmol/L (ref 134–144)
Total Protein: 6.3 g/dL (ref 6.0–8.5)

## 2019-10-12 LAB — LIPID PANEL
Chol/HDL Ratio: 4.7 ratio (ref 0.0–5.0)
Cholesterol, Total: 161 mg/dL (ref 100–199)
HDL: 34 mg/dL — ABNORMAL LOW (ref >39)
LDL Chol Calc (NIH): 54 mg/dL (ref 0–99)
Triglycerides: 488 mg/dL — ABNORMAL HIGH (ref 0–149)
VLDL Cholesterol Cal: 73 mg/dL — ABNORMAL HIGH (ref 5–40)

## 2019-11-19 DIAGNOSIS — E782 Mixed hyperlipidemia: Secondary | ICD-10-CM | POA: Diagnosis not present

## 2019-11-19 DIAGNOSIS — Z950 Presence of cardiac pacemaker: Secondary | ICD-10-CM | POA: Diagnosis not present

## 2019-11-19 DIAGNOSIS — I10 Essential (primary) hypertension: Secondary | ICD-10-CM | POA: Diagnosis not present

## 2019-11-19 DIAGNOSIS — I495 Sick sinus syndrome: Secondary | ICD-10-CM | POA: Diagnosis not present

## 2019-11-26 DIAGNOSIS — I495 Sick sinus syndrome: Secondary | ICD-10-CM | POA: Diagnosis not present

## 2019-12-12 ENCOUNTER — Other Ambulatory Visit: Payer: Self-pay | Admitting: Family Medicine

## 2019-12-12 DIAGNOSIS — G4700 Insomnia, unspecified: Secondary | ICD-10-CM

## 2019-12-12 NOTE — Telephone Encounter (Signed)
Requested medication (s) are due for refill today: no  Requested medication (s) are on the active medication list: yes   Last refill:  11/14/2019  Future visit scheduled: yes   Notes to clinic:  this refill cannot be delegated    Requested Prescriptions  Pending Prescriptions Disp Refills   zolpidem (AMBIEN) 10 MG tablet [Pharmacy Med Name: ZOLPIDEM TARTRATE 10 MG TAB] 30 tablet     Sig: TAKE 1 TABLET BY MOUTH AT BEDTIME      Not Delegated - Psychiatry:  Anxiolytics/Hypnotics Failed - 12/12/2019  9:12 AM      Failed - This refill cannot be delegated      Failed - Urine Drug Screen completed in last 360 days      Passed - Valid encounter within last 6 months    Recent Outpatient Visits           2 months ago Essential (primary) hypertension   [facility] Jerrol Banana., MD   4 months ago Essential (primary) hypertension   [facility] Jerrol Banana., MD   7 months ago Essential (primary) hypertension   [facility] Jerrol Banana., MD   1 year ago Prostate cancer [facility])   [facility] Jerrol Banana., MD   1 year ago Lymphadenitis   [facility] Jerrol Banana., MD

## 2019-12-24 DIAGNOSIS — Z23 Encounter for immunization: Secondary | ICD-10-CM | POA: Diagnosis not present

## 2020-01-28 ENCOUNTER — Other Ambulatory Visit: Payer: Self-pay

## 2020-01-28 ENCOUNTER — Encounter: Payer: Self-pay | Admitting: Family Medicine

## 2020-01-28 ENCOUNTER — Ambulatory Visit (INDEPENDENT_AMBULATORY_CARE_PROVIDER_SITE_OTHER): Payer: Medicare Other | Admitting: Family Medicine

## 2020-01-28 VITALS — BP 164/92 | HR 69 | Temp 98.2°F | Resp 16 | Ht 70.0 in | Wt 175.0 lb

## 2020-01-28 DIAGNOSIS — I10 Essential (primary) hypertension: Secondary | ICD-10-CM | POA: Diagnosis not present

## 2020-01-28 DIAGNOSIS — Z8546 Personal history of malignant neoplasm of prostate: Secondary | ICD-10-CM | POA: Diagnosis not present

## 2020-01-28 DIAGNOSIS — G4700 Insomnia, unspecified: Secondary | ICD-10-CM | POA: Diagnosis not present

## 2020-01-28 DIAGNOSIS — E7800 Pure hypercholesterolemia, unspecified: Secondary | ICD-10-CM

## 2020-01-28 DIAGNOSIS — G629 Polyneuropathy, unspecified: Secondary | ICD-10-CM

## 2020-01-28 MED ORDER — GABAPENTIN 100 MG PO CAPS: 100.0000 mg | ORAL_CAPSULE | Freq: Two times a day (BID) | ORAL | 5 refills | Status: DC

## 2020-01-28 NOTE — Progress Notes (Unsigned)
Established patient visit   Patient: Wesley Small   DOB: [birth year]   85 y.o. Male  MRN: [id number] Visit Date: 01/28/2020  Today's healthcare provider: [name], MD   Chief Complaint  Patient presents with  . Hypertension   Subjective    HPI  Patient feels well overall.  He states he needs Ambien to sleep at night.  His only complaint is 1 of bilateral neuropathy which bothers him. Blood pressures outside the office run better, 130s over 80s. Hypertension, follow-up  BP Readings from Last 3 Encounters:  01/28/20 (!) 164/92  10/09/19 (!) 165/93  08/08/19 (!) 164/93   Wt Readings from Last 3 Encounters:  01/28/20 175 lb (79.4 kg)  10/09/19 175 lb (79.4 kg)  08/08/19 174 lb (78.9 kg)     He was last seen for hypertension 4 months ago.  BP at that visit was 165/93. Management since that visit includes no medication changes.   He reports good compliance with treatment. He is not having side effects.  He is following a Regular diet. He is not exercising. He does not smoke.  Use of agents associated with hypertension: none.   Outside blood pressures are checked daily. Symptoms: No chest pain No chest pressure  No palpitations No syncope  No dyspnea No orthopnea  No paroxysmal nocturnal dyspnea No lower extremity edema   Pertinent labs: Lab Results  Component Value Date   CHOL 161 10/11/2019   HDL 34 (L) 10/11/2019   LDLCALC 54 10/11/2019   TRIG 488 (H) 10/11/2019   CHOLHDL 4.7 10/11/2019   Lab Results  Component Value Date   NA 142 10/11/2019   K 4.0 10/11/2019   CREATININE 1.10 10/11/2019   GFRNONAA 62 10/11/2019   GFRAA 72 10/11/2019   GLUCOSE 86 10/11/2019     The ASCVD Risk score (Goff DC Jr., et al., 2013) failed to calculate for the following reasons:   The 2013 ASCVD risk score is only valid for ages 31 to 35         Medications: Outpatient Medications Prior to Visit  Medication Sig  . ascorbic acid (VITAMIN C) 500 MG  tablet Take 500 mg by mouth daily.   Marland Kitchen aspirin 81 MG tablet Take 81 mg by mouth daily.   Marland Kitchen atorvastatin (LIPITOR) 10 MG tablet TAKE 1 TABLET BY MOUTH AT BEDTIME  . b complex vitamins tablet Take 1 tablet by mouth daily.  . Cholecalciferol (VITAMIN D3) 2000 units TABS Take 2,000 Units by mouth daily.  . Coenzyme Q10 100 MG capsule Take 100 mg daily by mouth.   . losartan (COZAAR) 100 MG tablet TAKE 1 TABLET BY MOUTH ONCE DAILY  . Magnesium Oxide 400 MG CAPS Take 400 mg by mouth daily.   . Metoprolol Succinate 50 MG CS24 Take 50 mg by mouth daily.  . Naproxen 375 MG TBEC Take 1 tablet (375 mg total) by mouth 2 (two) times daily as needed.  . NON FORMULARY Apply 1 mL topically every other day. Testosterone 20% 90gm   . Omega-3 Fatty Acids (SALMON OIL-1000 PO) Take 1,000 mg by mouth daily.  Marland Kitchen omeprazole (PRILOSEC) 20 MG capsule Take 20 mg by mouth at bedtime  . traMADol (ULTRAM) 50 MG tablet TAKE 1 TABLET BY MOUTH TWICE (2) DAILY AS NEEDED FOR PAIN  . vitamin B-12 (CYANOCOBALAMIN) 500 MCG tablet Take 500 mcg by mouth daily.   . Zinc 50 MG CAPS Take by mouth every other day.   Marland Kitchen  zolpidem (AMBIEN) 10 MG tablet TAKE 1 TABLET BY MOUTH AT BEDTIME  . acetaminophen (TYLENOL) 500 MG tablet Take 500 mg every 6 (six) hours as needed by mouth for moderate pain or headache.  (Patient not taking: No sig reported)  . hydrocortisone 2.5 % cream Apply topically 2 (two) times daily. (Patient not taking: No sig reported)   No facility-administered medications prior to visit.    Review of Systems  Constitutional: Negative.   Respiratory: Negative.   Cardiovascular: Negative.   Gastrointestinal: Negative.   Genitourinary: Negative.   Musculoskeletal: Negative.   Neurological: Negative.        Objective    BP (!) 164/92   Pulse 69   Temp 98.2 F (36.8 C)   Resp 16   Ht 5\' 10"  (1.778 m)   Wt 175 lb (79.4 kg)   BMI 25.11 kg/m  BP Readings from Last 3 Encounters:  01/28/20 (!) 164/92  10/09/19 (!)  165/93  08/08/19 (!) 164/93   Wt Readings from Last 3 Encounters:  01/28/20 175 lb (79.4 kg)  10/09/19 175 lb (79.4 kg)  08/08/19 174 lb (78.9 kg)      Physical Exam Vitals reviewed.  Constitutional:      Appearance: Normal appearance.  HENT:     Head: Normocephalic and atraumatic.     Right Ear: External ear normal.     Left Ear: External ear normal.  Eyes:     General: No scleral icterus.    Conjunctiva/sclera: Conjunctivae normal.  Neck:     Vascular: No carotid bruit.  Cardiovascular:     Rate and Rhythm: Normal rate and regular rhythm.     Pulses: Normal pulses.     Heart sounds: Normal heart sounds.  Pulmonary:     Effort: Pulmonary effort is normal.     Breath sounds: Normal breath sounds.  Abdominal:     Palpations: Abdomen is soft.  Musculoskeletal:     Cervical back: Normal range of motion.     Right lower leg: No edema.     Left lower leg: No edema.  Neurological:     General: No focal deficit present.     Mental Status: He is alert and oriented to person, place, and time.  Psychiatric:        Mood and Affect: Mood normal.        Behavior: Behavior normal.        Thought Content: Thought content normal.        Judgment: Judgment normal.       No results found for any visits on 01/28/20.  Assessment & Plan     1. Essential (primary) hypertension Whitecoat hypertension.  2. Hypercholesterolemia On atorvastatin  3. Neuropathy Gabapentin 100 mg twice daily. - gabapentin (NEURONTIN) 100 MG capsule; Take 1 capsule (100 mg total) by mouth 2 (two) times daily.  Dispense: 60 capsule; Refill: 5  4. Insomnia, persistent Patient uses Ambien regularly  5. History of prostate cancer [place] stable.   No follow-ups on file.      I, [name], MD, have reviewed all documentation for this visit. The documentation on 02/02/20 for the exam, diagnosis, procedures, and orders are all accurate and complete.    Richard Cranford Mon, MD   [facility] [phone] (phone) [phone] (fax)  [place]

## 2020-03-31 ENCOUNTER — Other Ambulatory Visit: Payer: Self-pay | Admitting: Family Medicine

## 2020-03-31 NOTE — Telephone Encounter (Signed)
Requested medication (s) are due for refill today: yes  Requested medication (s) are on the active medication list yes: yes  Last refill: 03/05/2020  Future visit scheduled: yes  Notes to clinic:  this refill cannot be delegated    Requested Prescriptions  Pending Prescriptions Disp Refills   traMADol (ULTRAM) 50 MG tablet [Pharmacy Med Name: TRAMADOL HCL 50 MG TAB] 60 tablet     Sig: TAKE 1 TABLET BY MOUTH TWICE (2) DAILY AS NEEDED FOR PAIN      Not Delegated - Analgesics:  Opioid Agonists Failed - 03/31/2020  9:23 AM      Failed - This refill cannot be delegated      Failed - Urine Drug Screen completed in last 360 days      Passed - Valid encounter within last 6 months    Recent Outpatient Visits           2 months ago Essential (primary) hypertension   [facility] Jerrol Banana., MD   5 months ago Essential (primary) hypertension   [facility] Jerrol Banana., MD   7 months ago Essential (primary) hypertension   [facility] Jerrol Banana., MD   10 months ago Essential (primary) hypertension   [facility] Jerrol Banana., MD   1 year ago Prostate cancer [facility])   [facility] Jerrol Banana., MD       Future Appointments             In 1 month Jerrol Banana., MD [facility], [place]

## 2020-04-03 ENCOUNTER — Other Ambulatory Visit: Payer: Self-pay | Admitting: Family Medicine

## 2020-04-03 DIAGNOSIS — I10 Essential (primary) hypertension: Secondary | ICD-10-CM

## 2020-04-06 ENCOUNTER — Other Ambulatory Visit: Payer: Self-pay | Admitting: Family Medicine

## 2020-04-06 DIAGNOSIS — G4700 Insomnia, unspecified: Secondary | ICD-10-CM

## 2020-04-06 NOTE — Telephone Encounter (Signed)
Requested medication (s) are due for refill today: yes  Requested medication (s) are on the active medication list: yes   Last refill:  03/09/2020  Future visit scheduled: no  Notes to clinic:  this refill cannot be delegated   Requested Prescriptions  Pending Prescriptions Disp Refills   zolpidem (AMBIEN) 10 MG tablet [Pharmacy Med Name: ZOLPIDEM TARTRATE 10 MG TAB] 30 tablet     Sig: TAKE 1 TABLET BY MOUTH AT BEDTIME      Not Delegated - Psychiatry:  Anxiolytics/Hypnotics Failed - 04/06/2020  9:45 AM      Failed - This refill cannot be delegated      Failed - Urine Drug Screen completed in last 360 days      Passed - Valid encounter within last 6 months    Recent Outpatient Visits           2 months ago Essential (primary) hypertension   [facility] Jerrol Banana., MD   6 months ago Essential (primary) hypertension   [facility] Jerrol Banana., MD   8 months ago Essential (primary) hypertension   [facility] Jerrol Banana., MD   11 months ago Essential (primary) hypertension   [facility] Jerrol Banana., MD   1 year ago Prostate cancer [facility])   [facility] Jerrol Banana., MD       Future Appointments             In 4 weeks Jerrol Banana., MD [facility], [place]

## 2020-04-07 ENCOUNTER — Other Ambulatory Visit: Payer: Self-pay | Admitting: Family Medicine

## 2020-04-28 ENCOUNTER — Ambulatory Visit: Payer: Self-pay | Admitting: Family Medicine

## 2020-05-05 ENCOUNTER — Ambulatory Visit (INDEPENDENT_AMBULATORY_CARE_PROVIDER_SITE_OTHER): Payer: Medicare Other | Admitting: Family Medicine

## 2020-05-05 ENCOUNTER — Other Ambulatory Visit: Payer: Self-pay

## 2020-05-05 ENCOUNTER — Encounter: Payer: Self-pay | Admitting: Family Medicine

## 2020-05-05 VITALS — BP 175/95 | HR 67 | Temp 98.2°F | Resp 16 | Wt 170.0 lb

## 2020-05-05 DIAGNOSIS — E7800 Pure hypercholesterolemia, unspecified: Secondary | ICD-10-CM

## 2020-05-05 DIAGNOSIS — Z131 Encounter for screening for diabetes mellitus: Secondary | ICD-10-CM | POA: Diagnosis not present

## 2020-05-05 DIAGNOSIS — I10 Essential (primary) hypertension: Secondary | ICD-10-CM | POA: Diagnosis not present

## 2020-05-05 DIAGNOSIS — Z8546 Personal history of malignant neoplasm of prostate: Secondary | ICD-10-CM | POA: Diagnosis not present

## 2020-05-05 DIAGNOSIS — M1990 Unspecified osteoarthritis, unspecified site: Secondary | ICD-10-CM

## 2020-05-05 DIAGNOSIS — G629 Polyneuropathy, unspecified: Secondary | ICD-10-CM | POA: Diagnosis not present

## 2020-05-05 DIAGNOSIS — R202 Paresthesia of skin: Secondary | ICD-10-CM

## 2020-05-05 NOTE — Progress Notes (Signed)
Established patient visit   Patient: Wesley Small   DOB: 07-07-1935   85 y.o. Male  MRN: [id number] Visit Date: 05/05/2020  Today's healthcare provider: [name], MD   Chief Complaint  Patient presents with  . Neuropathy follow up   . Hypertension  . Hyperlipidemia   Subjective    HPI  Patient overall feels very well.  He is not a strong as he used to be and is having some knee pain but mainly what it bothers him is his neuropathy and the tingling associated with that.  He is on gabapentin.  Low-dose. He continues to state he needs sleeping pills carefully. He states his blood pressure at home runs about 140s over 70. Follow up for neuropathy  The patient was last seen for this 3 months ago. Changes made at last visit include starting Gabapentin 100mg  twice daily.  He reports fair compliance with treatment. He feels that condition is Unchanged. He is having side effects. Patient reports that he felt a little "off balance". He also did not notice an improvement in his symptoms. Reports only taking the gabapentin for about 2 weeks.    Hypertension, follow up: No changes. Continue current medications.   BP Readings from Last 3 Encounters:  05/05/20 (!) 175/95  01/28/20 (!) 164/92  10/09/19 (!) 165/93   Hyperlipidemia, follow up: No medication changes. On Lipitor 10mg .  Lab Results  Component Value Date   CHOL 161 10/11/2019   HDL 34 (L) 10/11/2019   LDLCALC 54 10/11/2019   TRIG 488 (H) 10/11/2019   CHOLHDL 4.7 10/11/2019         Medications: Outpatient Medications Prior to Visit  Medication Sig  . acetaminophen (TYLENOL) 500 MG tablet Take 500 mg every 6 (six) hours as needed by mouth for moderate pain or headache.  (Patient not taking: No sig reported)  . ascorbic acid (VITAMIN C) 500 MG tablet Take 500 mg by mouth daily.   Marland Kitchen aspirin 81 MG tablet Take 81 mg by mouth daily.   Marland Kitchen atorvastatin (LIPITOR) 10 MG tablet TAKE 1 TABLET BY MOUTH AT  BEDTIME  . b complex vitamins tablet Take 1 tablet by mouth daily.  . Cholecalciferol (VITAMIN D3) 2000 units TABS Take 2,000 Units by mouth daily.  . Coenzyme Q10 100 MG capsule Take 100 mg daily by mouth.   . gabapentin (NEURONTIN) 100 MG capsule Take 1 capsule (100 mg total) by mouth 2 (two) times daily.  . hydrocortisone 2.5 % cream Apply topically 2 (two) times daily. (Patient not taking: No sig reported)  . losartan (COZAAR) 100 MG tablet TAKE 1 TABLET BY MOUTH ONCE DAILY  . Magnesium Oxide 400 MG CAPS Take 400 mg by mouth daily.   . Metoprolol Succinate 50 MG CS24 Take 50 mg by mouth daily.  . Naproxen 375 MG TBEC Take 1 tablet (375 mg total) by mouth 2 (two) times daily as needed.  . NON FORMULARY Apply 1 mL topically every other day. Testosterone 20% 90gm   . Omega-3 Fatty Acids (SALMON OIL-1000 PO) Take 1,000 mg by mouth daily.  Marland Kitchen omeprazole (PRILOSEC) 20 MG capsule Take 20 mg by mouth at bedtime  . traMADol (ULTRAM) 50 MG tablet TAKE 1 TABLET BY MOUTH TWICE (2) DAILY AS NEEDED FOR PAIN  . vitamin B-12 (CYANOCOBALAMIN) 500 MCG tablet Take 500 mcg by mouth daily.   . Zinc 50 MG CAPS Take by mouth every other day.   . zolpidem (AMBIEN)  10 MG tablet TAKE 1 TABLET BY MOUTH AT BEDTIME   No facility-administered medications prior to visit.    Review of Systems  Respiratory: Negative for cough and shortness of breath.   Cardiovascular: Negative for chest pain, palpitations and leg swelling.  Musculoskeletal: Positive for arthralgias and myalgias.  Neurological: Negative for dizziness, syncope, weakness and headaches.        Objective    BP (!) 175/95   Pulse 67   Temp 98.2 F (36.8 C)   Resp 16   Wt 170 lb (77.1 kg)   BMI 24.39 kg/m  BP Readings from Last 3 Encounters:  05/05/20 (!) 175/95  01/28/20 (!) 164/92  10/09/19 (!) 165/93   Wt Readings from Last 3 Encounters:  05/05/20 170 lb (77.1 kg)  01/28/20 175 lb (79.4 kg)  10/09/19 175 lb (79.4 kg)       Physical  Exam Vitals reviewed.  Constitutional:      Appearance: Normal appearance.  HENT:     Head: Normocephalic and atraumatic.     Right Ear: External ear normal.     Left Ear: External ear normal.  Eyes:     General: No scleral icterus.    Conjunctiva/sclera: Conjunctivae normal.  Neck:     Vascular: No carotid bruit.  Cardiovascular:     Rate and Rhythm: Normal rate and regular rhythm.     Pulses: Normal pulses.     Heart sounds: Normal heart sounds.  Pulmonary:     Effort: Pulmonary effort is normal.     Breath sounds: Normal breath sounds.  Abdominal:     Palpations: Abdomen is soft.  Musculoskeletal:     Cervical back: Normal range of motion.     Right lower leg: No edema.     Left lower leg: No edema.  Skin:    General: Skin is warm and dry.  Neurological:     General: No focal deficit present.     Mental Status: He is alert and oriented to person, place, and time.  Psychiatric:        Mood and Affect: Mood normal.        Behavior: Behavior normal.        Thought Content: Thought content normal.        Judgment: Judgment normal.       No results found for any visits on 05/05/20.  Assessment & Plan     1. Neuropathy Alpha lipoic acid daily.  Consider increasing gabapentin dose. - Hemoglobin A1c - TSH - B12 and Folate Panel  2. Tingling  - Hemoglobin A1c - TSH - B12 and Folate Panel  3. Hypercholesterolemia  - Hemoglobin A1c  4. Screening for diabetes mellitus  - Hemoglobin A1c  5. Paresthesia  - Hemoglobin A1c  6. Essential (primary) hypertension [place] component to his blood pressure.  7. Arthritis Start with topical Voltaren gel  8. History of prostate cancer   No follow-ups on file.      I, [name], MD, have reviewed all documentation for this visit. The documentation on 05/10/20 for the exam, diagnosis, procedures, and orders are all accurate and complete.    Robben Jagiello Cranford Mon, MD  [facility] [phone] (phone) [phone] (fax)  [place]

## 2020-05-06 LAB — HEMOGLOBIN A1C
Est. average glucose Bld gHb Est-mCnc: 105 mg/dL
Hgb A1c MFr Bld: 5.3 % (ref 4.8–5.6)

## 2020-05-06 LAB — B12 AND FOLATE PANEL
Folate: 16 ng/mL (ref >3.0)
Vitamin B-12: 902 pg/mL (ref 232–1245)

## 2020-05-06 LAB — TSH: TSH: 1.6 u[IU]/mL (ref 0.450–4.500)

## 2020-05-07 DIAGNOSIS — H26493 Other secondary cataract, bilateral: Secondary | ICD-10-CM | POA: Diagnosis not present

## 2020-05-07 DIAGNOSIS — M170 Bilateral primary osteoarthritis of knee: Secondary | ICD-10-CM | POA: Diagnosis not present

## 2020-05-15 ENCOUNTER — Other Ambulatory Visit: Payer: Self-pay | Admitting: Family Medicine

## 2020-05-19 DIAGNOSIS — I495 Sick sinus syndrome: Secondary | ICD-10-CM | POA: Diagnosis not present

## 2020-05-21 DIAGNOSIS — I10 Essential (primary) hypertension: Secondary | ICD-10-CM | POA: Diagnosis not present

## 2020-05-21 DIAGNOSIS — E782 Mixed hyperlipidemia: Secondary | ICD-10-CM | POA: Diagnosis not present

## 2020-05-21 DIAGNOSIS — I495 Sick sinus syndrome: Secondary | ICD-10-CM | POA: Diagnosis not present

## 2020-05-21 DIAGNOSIS — Z950 Presence of cardiac pacemaker: Secondary | ICD-10-CM | POA: Diagnosis not present

## 2020-06-17 DIAGNOSIS — H26493 Other secondary cataract, bilateral: Secondary | ICD-10-CM | POA: Diagnosis not present

## 2020-06-30 ENCOUNTER — Other Ambulatory Visit: Payer: Self-pay | Admitting: Family Medicine

## 2020-07-12 DIAGNOSIS — Z20822 Contact with and (suspected) exposure to covid-19: Secondary | ICD-10-CM | POA: Diagnosis not present

## 2020-07-30 ENCOUNTER — Other Ambulatory Visit: Payer: Self-pay | Admitting: Family Medicine

## 2020-07-30 DIAGNOSIS — G4700 Insomnia, unspecified: Secondary | ICD-10-CM

## 2020-08-12 DIAGNOSIS — Z20822 Contact with and (suspected) exposure to covid-19: Secondary | ICD-10-CM | POA: Diagnosis not present

## 2020-08-28 ENCOUNTER — Other Ambulatory Visit: Payer: Self-pay | Admitting: Family Medicine

## 2020-08-28 NOTE — Telephone Encounter (Signed)
Requested medication (s) are due for refill today: yes   Requested medication (s) are on the active medication list:  yes   Last refill:  07/30/2020  Future visit scheduled: yes   Notes to clinic: this refill cannot be delegated    Requested Prescriptions  Pending Prescriptions Disp Refills   traMADol (ULTRAM) 50 MG tablet [Pharmacy Med Name: TRAMADOL HCL 50 MG TAB] 60 tablet     Sig: TAKE 1 TABLET BY MOUTH TWICE (2) DAILY AS NEEDED FOR PAIN     Not Delegated - Analgesics:  Opioid Agonists Failed - 08/28/2020  9:34 AM      Failed - This refill cannot be delegated      Failed - Urine Drug Screen completed in last 360 days      Passed - Valid encounter within last 6 months    Recent Outpatient Visits           3 months ago Neuropathy   [facility] Jerrol Banana., MD   7 months ago Essential (primary) hypertension   [facility] Jerrol Banana., MD   10 months ago Essential (primary) hypertension   [facility] Jerrol Banana., MD   1 year ago Essential (primary) hypertension   [facility] Jerrol Banana., MD   1 year ago Essential (primary) hypertension   [facility] Jerrol Banana., MD

## 2020-10-02 ENCOUNTER — Other Ambulatory Visit: Payer: Self-pay | Admitting: Family Medicine

## 2020-10-02 DIAGNOSIS — I10 Essential (primary) hypertension: Secondary | ICD-10-CM

## 2020-10-27 ENCOUNTER — Ambulatory Visit (INDEPENDENT_AMBULATORY_CARE_PROVIDER_SITE_OTHER): Payer: Medicare Other | Admitting: Family Medicine

## 2020-10-27 ENCOUNTER — Other Ambulatory Visit: Payer: Self-pay

## 2020-10-27 VITALS — BP 170/97 | HR 68 | Ht 70.0 in | Wt 171.0 lb

## 2020-10-27 DIAGNOSIS — M1710 Unilateral primary osteoarthritis, unspecified knee: Secondary | ICD-10-CM | POA: Diagnosis not present

## 2020-10-27 DIAGNOSIS — Z0000 Encounter for general adult medical examination without abnormal findings: Secondary | ICD-10-CM | POA: Diagnosis not present

## 2020-10-27 DIAGNOSIS — I10 Essential (primary) hypertension: Secondary | ICD-10-CM

## 2020-10-27 DIAGNOSIS — I495 Sick sinus syndrome: Secondary | ICD-10-CM | POA: Diagnosis not present

## 2020-10-27 DIAGNOSIS — E7800 Pure hypercholesterolemia, unspecified: Secondary | ICD-10-CM | POA: Diagnosis not present

## 2020-10-27 DIAGNOSIS — Z8546 Personal history of malignant neoplasm of prostate: Secondary | ICD-10-CM | POA: Diagnosis not present

## 2020-10-27 DIAGNOSIS — G4700 Insomnia, unspecified: Secondary | ICD-10-CM | POA: Diagnosis not present

## 2020-10-27 DIAGNOSIS — Z23 Encounter for immunization: Secondary | ICD-10-CM | POA: Diagnosis not present

## 2020-10-27 NOTE — Progress Notes (Signed)
Annual Wellness Visit     Patient: Wesley Small, Male    DOB: 1935-03-26, 85 y.o.   MRN: [id number] Visit Date: 10/27/2020  Today's Provider: [name], MD   No chief complaint on file.  Subjective    Wesley Small is a 85 y.o. male who presents today for his Annual Wellness Visit. He reports consuming a general diet. He generally feels well. He reports sleeping well. He does not have additional problems to discuss today.  He continues to take nightly Ambien despite my admonitions. His blood pressure runs at home about 145-150/80.  He states he feels good at this level. No other complaints other than he does not have the energy that he used to. HPI    Medications: Outpatient Medications Prior to Visit  Medication Sig   acetaminophen (TYLENOL) 500 MG tablet Take 500 mg by mouth every 6 (six) hours as needed for moderate pain or headache.   ascorbic acid (VITAMIN C) 500 MG tablet Take 500 mg by mouth daily.    aspirin 81 MG tablet Take 81 mg by mouth daily.    atorvastatin (LIPITOR) 10 MG tablet TAKE 1 TABLET BY MOUTH AT BEDTIME   b complex vitamins tablet Take 1 tablet by mouth daily.   Cholecalciferol (VITAMIN D3) 2000 units TABS Take 2,000 Units by mouth daily.   Coenzyme Q10 100 MG capsule Take 100 mg daily by mouth.    gabapentin (NEURONTIN) 100 MG capsule Take 1 capsule (100 mg total) by mouth 2 (two) times daily.   hydrocortisone 2.5 % cream Apply topically 2 (two) times daily.   losartan (COZAAR) 100 MG tablet TAKE 1 TABLET BY MOUTH ONCE DAILY   Magnesium Oxide 400 MG CAPS Take 400 mg by mouth daily.    Metoprolol Succinate 50 MG CS24 Take 50 mg by mouth daily.   Naproxen 375 MG TBEC Take 1 tablet (375 mg total) by mouth 2 (two) times daily as needed.   NON FORMULARY Apply 1 mL topically every other day. Testosterone 20% 90gm    Omega-3 Fatty Acids (SALMON OIL-1000 PO) Take 1,000 mg by mouth daily.   omeprazole (PRILOSEC) 20 MG capsule Take 20  mg by mouth at bedtime   traMADol (ULTRAM) 50 MG tablet TAKE 1 TABLET BY MOUTH TWICE (2) DAILY AS NEEDED FOR PAIN   vitamin B-12 (CYANOCOBALAMIN) 500 MCG tablet Take 500 mcg by mouth daily.    Zinc 50 MG CAPS Take by mouth every other day.    zolpidem (AMBIEN) 10 MG tablet TAKE 1 TABLET BY MOUTH AT BEDTIME   No facility-administered medications prior to visit.    Allergies  Allergen Reactions   Penicillins Rash and Other (See Comments)    Has patient had a PCN reaction causing immediate rash, facial/tongue/throat swelling, SOB or lightheadedness with hypotension: Yes Has patient had a PCN reaction causing severe rash involving mucus membranes or skin necrosis: No  Has patient had a PCN reaction that required hospitalization: No Has patient had a PCN reaction occurring within the last 10 years: No If all of the above answers are "NO", then may proceed with Cephalosporin use.     Patient Care Team: Jerrol Banana., MD as PCP - General (Family Medicine) Eulogio Bear, MD as Consulting Physician (Ophthalmology) Thornton Park, MD as Referring Physician (Orthopedic Surgery) Dimmig, Marcello Moores, MD as Referring Physician (Orthopedic Surgery) Teodoro Spray, MD as Consulting Physician (Cardiology) Ralene Bathe, MD as Consulting Physician (Dermatology)  Review of Systems  Constitutional: Negative.   HENT: Negative.    Eyes: Negative.   Respiratory: Negative.    Cardiovascular: Negative.   Gastrointestinal: Negative.   Endocrine: Negative.   Genitourinary: Negative.   Musculoskeletal:  Positive for arthralgias and back pain.  Skin: Negative.   Allergic/Immunologic: Negative.   Neurological:  Positive for numbness.  Hematological: Negative.   Psychiatric/Behavioral: Negative.          Objective    Vitals: BP (!) 170/97 (BP Location: Right Arm, Patient Position: Sitting, Cuff Size: Normal)   Pulse 68   Ht 5\' 10"  (1.778 m)   Wt 171 lb (77.6 kg)   SpO2 99%   BMI  24.54 kg/m     Physical Exam Constitutional:      Appearance: Normal appearance. He is normal weight.  HENT:     Head: Normocephalic and atraumatic.     Right Ear: Tympanic membrane, ear canal and external ear normal.     Left Ear: Tympanic membrane, ear canal and external ear normal.     Nose: Nose normal.     Mouth/Throat:     Mouth: Mucous membranes are moist.     Pharynx: Oropharynx is clear.  Eyes:     Extraocular Movements: Extraocular movements intact.     Conjunctiva/sclera: Conjunctivae normal.     Pupils: Pupils are equal, round, and reactive to light.  Cardiovascular:     Rate and Rhythm: Normal rate and regular rhythm.     Pulses: Normal pulses.     Heart sounds: Normal heart sounds.  Pulmonary:     Effort: Pulmonary effort is normal.     Breath sounds: Normal breath sounds.  Abdominal:     General: Abdomen is flat. Bowel sounds are normal.     Palpations: Abdomen is soft.  Musculoskeletal:     Cervical back: Normal range of motion and neck supple.  Skin:    General: Skin is warm and dry.  Neurological:     General: No focal deficit present.     Mental Status: He is alert and oriented to person, place, and time.  Psychiatric:        Mood and Affect: Mood normal.        Behavior: Behavior normal.        Thought Content: Thought content normal.        Judgment: Judgment normal.     Most recent functional status assessment: In your present state of health, do you have any difficulty performing the following activities: 10/27/2020  Hearing? Y  Vision? N  Difficulty concentrating or making decisions? N  Walking or climbing stairs? Y  Dressing or bathing? N  Doing errands, shopping? N  Some recent data might be hidden   Most recent fall risk assessment: Fall Risk  10/27/2020  Falls in the past year? 1  Number falls in past yr: 0  Injury with Fall? 0  Follow up -    Most recent depression screenings: PHQ 2/9 Scores 10/27/2020 09/12/2019  PHQ - 2 Score  0 0  PHQ- 9 Score 1 -   Most recent cognitive screening: 6CIT Screen 09/12/2019  What Year? 0 points  What month? 0 points  What time? 0 points  Count back from 20 0 points  Months in reverse 2 points  Repeat phrase 0 points  Total Score 2   Most recent Audit-C alcohol use screening Alcohol Use Disorder Test (AUDIT) 10/27/2020  1. How often do you have a drink  containing alcohol? 4  2. How many drinks containing alcohol do you have on a typical day when you are drinking? 0  3. How often do you have six or more drinks on one occasion? 0  AUDIT-C Score 4  4. How often during the last year have you found that you were not able to stop drinking once you had started? -  5. How often during the last year have you failed to do what was normally expected from you because of drinking? -  6. How often during the last year have you needed a first drink in the morning to get yourself going after a heavy drinking session? -  7. How often during the last year have you had a feeling of guilt of remorse after drinking? -  8. How often during the last year have you been unable to remember what happened the night before because you had been drinking? -  9. Have you or someone else been injured as a result of your drinking? -  10. Has a relative or friend or a doctor or another health worker been concerned about your drinking or suggested you cut down? -  Alcohol Use Disorder Identification Test Final Score (AUDIT) -  Alcohol Brief Interventions/Follow-up -   A score of 3 or more in women, and 4 or more in men indicates increased risk for alcohol abuse, EXCEPT if all of the points are from question 1   No results found for any visits on 10/27/20.  Assessment & Plan     Annual wellness visit done today including the all of the following: Reviewed patient's Family Medical History Reviewed and updated list of patient's medical providers Assessment of cognitive impairment was done Assessed patient's  functional ability Established a written schedule for health screening [place] Completed and Reviewed  Exercise Activities and Dietary recommendations  Goals      DIET - INCREASE WATER INTAKE     Recommend increasing water intake to 4 glasses a day.         Immunization History  Administered Date(s) Administered   Influenza, High Dose Seasonal PF 11/10/2014, 11/04/2015, 10/09/2017, 10/19/2018   PFIZER(Purple Top)SARS-COV-2 Vaccination 02/10/2019, 03/05/2019   Pneumococcal Conjugate-13 05/05/2014   Pneumococcal Polysaccharide-23 08/02/2016   Tdap 10/12/2010   Zoster Recombinat (Shingrix) 11/10/2016, 01/26/2017    Health Maintenance  Topic Date Due   COVID-19 Vaccine (3 - Pfizer risk series) 04/02/2019   INFLUENZA VACCINE  08/10/2020   TETANUS/TDAP  10/11/2020   Zoster Vaccines- Shingrix  Completed   HPV VACCINES  Aged Out     Discussed health benefits of physical activity, and encouraged him to engage in regular exercise appropriate for his age and condition.    1. Medicare annual wellness visit, subsequent   2. Essential (primary) hypertension Elevated blood pressures at home better than here.  Patient wishes no further intervention. - CBC with Differential/Platelet - Comprehensive metabolic panel - TSH  3. Hypercholesterolemia On atorvastatin 10 - Lipid Panel With LDL/HDL Ratio  4. Need for influenza vaccination  - Flu Vaccine QUAD High Dose(Fluad)  5. Sick sinus syndrome ([place]) Pacemaker in place  6. Primary osteoarthritis of knee, unspecified laterality   7. Insomnia, persistent Patient advised again to cut back on Ambien but he wishes to continue as he sleeps well with that and has no untoward side effect  8. History of prostate cancer    No follow-ups on file.     I, [name], MD,  have reviewed all documentation for this visit. The documentation on 11/02/20 for the exam, diagnosis, procedures, and orders are all  accurate and complete.    Korbin Notaro Cranford Mon, MD  [facility] [phone] (phone) [phone] (fax)  [place]

## 2020-10-29 DIAGNOSIS — E7800 Pure hypercholesterolemia, unspecified: Secondary | ICD-10-CM | POA: Diagnosis not present

## 2020-10-29 DIAGNOSIS — M170 Bilateral primary osteoarthritis of knee: Secondary | ICD-10-CM | POA: Diagnosis not present

## 2020-10-29 DIAGNOSIS — I10 Essential (primary) hypertension: Secondary | ICD-10-CM | POA: Diagnosis not present

## 2020-10-30 LAB — CBC WITH DIFFERENTIAL/PLATELET
Basophils Absolute: 0.1 10*3/uL (ref 0.0–0.2)
Basos: 1 %
EOS (ABSOLUTE): 0.3 10*3/uL (ref 0.0–0.4)
Eos: 4 %
Hematocrit: 43.2 % (ref 37.5–51.0)
Hemoglobin: 15.2 g/dL (ref 13.0–17.7)
Immature Grans (Abs): 0 10*3/uL (ref 0.0–0.1)
Immature Granulocytes: 0 %
Lymphocytes Absolute: 2.2 10*3/uL (ref 0.7–3.1)
Lymphs: 33 %
MCH: 34.7 pg — ABNORMAL HIGH (ref 26.6–33.0)
MCHC: 35.2 g/dL (ref 31.5–35.7)
MCV: 99 fL — ABNORMAL HIGH (ref 79–97)
Monocytes Absolute: 0.8 10*3/uL (ref 0.1–0.9)
Monocytes: 11 %
Neutrophils Absolute: 3.4 10*3/uL (ref 1.4–7.0)
Neutrophils: 51 %
Platelets: 249 10*3/uL (ref 150–450)
RBC: 4.38 x10E6/uL (ref 4.14–5.80)
RDW: 12.4 % (ref 11.6–15.4)
WBC: 6.7 10*3/uL (ref 3.4–10.8)

## 2020-10-30 LAB — COMPREHENSIVE METABOLIC PANEL
ALT: 31 IU/L (ref 0–44)
AST: 36 IU/L (ref 0–40)
Albumin/Globulin Ratio: 2.6 — ABNORMAL HIGH (ref 1.2–2.2)
Albumin: 4.5 g/dL (ref 3.6–4.6)
Alkaline Phosphatase: 79 IU/L (ref 44–121)
BUN/Creatinine Ratio: 9 — ABNORMAL LOW (ref 10–24)
BUN: 11 mg/dL (ref 8–27)
Bilirubin Total: 0.8 mg/dL (ref 0.0–1.2)
CO2: 24 mmol/L (ref 20–29)
Calcium: 9.2 mg/dL (ref 8.6–10.2)
Chloride: 104 mmol/L (ref 96–106)
Creatinine, Ser: 1.19 mg/dL (ref 0.76–1.27)
Globulin, Total: 1.7 g/dL (ref 1.5–4.5)
Glucose: 94 mg/dL (ref 70–99)
Potassium: 4.4 mmol/L (ref 3.5–5.2)
Sodium: 146 mmol/L — ABNORMAL HIGH (ref 134–144)
Total Protein: 6.2 g/dL (ref 6.0–8.5)
eGFR: 61 mL/min/{1.73_m2} (ref >59)

## 2020-10-30 LAB — TSH: TSH: 1.82 u[IU]/mL (ref 0.450–4.500)

## 2020-10-30 LAB — LIPID PANEL WITH LDL/HDL RATIO
Cholesterol, Total: 176 mg/dL (ref 100–199)
HDL: 45 mg/dL (ref >39)
LDL Chol Calc (NIH): 78 mg/dL (ref 0–99)
LDL/HDL Ratio: 1.7 ratio (ref 0.0–3.6)
Triglycerides: 329 mg/dL — ABNORMAL HIGH (ref 0–149)
VLDL Cholesterol Cal: 53 mg/dL — ABNORMAL HIGH (ref 5–40)

## 2020-11-06 DIAGNOSIS — M545 Low back pain, unspecified: Secondary | ICD-10-CM | POA: Diagnosis not present

## 2020-11-10 ENCOUNTER — Other Ambulatory Visit: Payer: Self-pay | Admitting: Family Medicine

## 2020-11-10 NOTE — Telephone Encounter (Signed)
Requested Prescriptions  Pending Prescriptions Disp Refills  . atorvastatin (LIPITOR) 10 MG tablet [Pharmacy Med Name: ATORVASTATIN CALCIUM 10 MG TAB] 90 tablet 1    Sig: TAKE 1 TABLET BY MOUTH AT BEDTIME     Cardiovascular:  Antilipid - Statins Failed - 11/10/2020  9:03 AM      Failed - Triglycerides in normal range and within 360 days    Triglycerides  Date Value Ref Range Status  10/29/2020 329 (H) 0 - 149 mg/dL Final         Passed - Total Cholesterol in normal range and within 360 days    Cholesterol, Total  Date Value Ref Range Status  10/29/2020 176 100 - 199 mg/dL Final         Passed - LDL in normal range and within 360 days    LDL Chol Calc (NIH)  Date Value Ref Range Status  10/29/2020 78 0 - 99 mg/dL Final         Passed - HDL in normal range and within 360 days    HDL  Date Value Ref Range Status  10/29/2020 45 >39 mg/dL Final         Passed - Patient is not pregnant      Passed - Valid encounter within last 12 months    Recent Outpatient Visits          2 weeks ago Medicare annual wellness visit, subsequent   [facility] Jerrol Banana., MD   6 months ago Neuropathy   [facility] Jerrol Banana., MD   9 months ago Essential (primary) hypertension   [facility] Jerrol Banana., MD   1 year ago Essential (primary) hypertension   [facility] Jerrol Banana., MD   1 year ago Essential (primary) hypertension   [facility] Jerrol Banana., MD      Future Appointments            In 5 months Jerrol Banana., MD [facility], [place]

## 2020-11-24 ENCOUNTER — Other Ambulatory Visit: Payer: Self-pay | Admitting: Family Medicine

## 2020-11-24 DIAGNOSIS — G4700 Insomnia, unspecified: Secondary | ICD-10-CM

## 2020-11-24 NOTE — Telephone Encounter (Signed)
Requested medication (s) are due for refill today: yes  Requested medication (s) are on the active medication list: yes  Last refill:  07/31/20 #30 4 refills  Future visit scheduled: yes in 5 months  Notes to clinic:  not delegate per protocol     Requested Prescriptions  Pending Prescriptions Disp Refills   zolpidem (AMBIEN) 10 MG tablet [Pharmacy Med Name: ZOLPIDEM TARTRATE 10 MG TAB] 30 tablet     Sig: TAKE 1 TABLET BY MOUTH AT BEDTIME     Not Delegated - Psychiatry:  Anxiolytics/Hypnotics Failed - 11/24/2020  9:23 AM      Failed - This refill cannot be delegated      Failed - Urine Drug Screen completed in last 360 days      Passed - Valid encounter within last 6 months    Recent Outpatient Visits           4 weeks ago Medicare annual wellness visit, subsequent   [facility] Jerrol Banana., MD   6 months ago Neuropathy   [facility] Jerrol Banana., MD   10 months ago Essential (primary) hypertension   [facility] Jerrol Banana., MD   1 year ago Essential (primary) hypertension   [facility] Jerrol Banana., MD   1 year ago Essential (primary) hypertension   [facility] Jerrol Banana., MD       Future Appointments             In 5 months Jerrol Banana., MD [facility], [place]

## 2020-11-25 DIAGNOSIS — M5416 Radiculopathy, lumbar region: Secondary | ICD-10-CM | POA: Diagnosis not present

## 2020-11-26 DIAGNOSIS — I495 Sick sinus syndrome: Secondary | ICD-10-CM | POA: Diagnosis not present

## 2020-11-26 DIAGNOSIS — E782 Mixed hyperlipidemia: Secondary | ICD-10-CM | POA: Diagnosis not present

## 2020-11-26 DIAGNOSIS — I10 Essential (primary) hypertension: Secondary | ICD-10-CM | POA: Diagnosis not present

## 2020-11-26 DIAGNOSIS — Z950 Presence of cardiac pacemaker: Secondary | ICD-10-CM | POA: Diagnosis not present

## 2021-01-25 ENCOUNTER — Other Ambulatory Visit: Payer: Self-pay | Admitting: Family Medicine

## 2021-02-08 NOTE — Telephone Encounter (Signed)
Patient called in to inform Dr Ky Barban stated that he have been taking his traMADol (ULTRAM) 50 MG tablet  say that last refill was picked up on 12/22/1920. But he would like a refill sent in since he is all out. Please advise

## 2021-02-10 ENCOUNTER — Other Ambulatory Visit: Payer: Self-pay | Admitting: Family Medicine

## 2021-02-10 NOTE — Telephone Encounter (Signed)
Requested medication (s) are due for refill today: yes  Requested medication (s) are on the active medication list: yes  Last refill:  08/31/20 #60 for 4 RF   Future visit scheduled: yes 04/27/21  Notes to clinic:  This medication can not be delegated, please assess.     Requested Prescriptions  Pending Prescriptions Disp Refills   traMADol (ULTRAM) 50 MG tablet [Pharmacy Med Name: TRAMADOL HCL 50 MG TAB] 60 tablet     Sig: TAKE 1 TABLET BY MOUTH TWICE (2) DAILY AS NEEDED FOR PAIN     Not Delegated - Analgesics:  Opioid Agonists Failed - 02/10/2021 12:47 PM      Failed - This refill cannot be delegated      Failed - Urine Drug Screen completed in last 360 days      Failed - Valid encounter within last 3 months    Recent Outpatient Visits           3 months ago Medicare annual wellness visit, subsequent   [facility] Jerrol Banana., MD   9 months ago Neuropathy   [facility] Jerrol Banana., MD   1 year ago Essential (primary) hypertension   [facility] Jerrol Banana., MD   1 year ago Essential (primary) hypertension   [facility] Jerrol Banana., MD   1 year ago Essential (primary) hypertension   [facility] Jerrol Banana., MD       Future Appointments             In 2 months Jerrol Banana., MD [facility], [place]

## 2021-02-16 DIAGNOSIS — I495 Sick sinus syndrome: Secondary | ICD-10-CM | POA: Diagnosis not present

## 2021-03-08 DIAGNOSIS — Z20822 Contact with and (suspected) exposure to covid-19: Secondary | ICD-10-CM | POA: Diagnosis not present

## 2021-03-19 DIAGNOSIS — Z20822 Contact with and (suspected) exposure to covid-19: Secondary | ICD-10-CM | POA: Diagnosis not present

## 2021-03-19 DIAGNOSIS — R059 Cough, unspecified: Secondary | ICD-10-CM | POA: Diagnosis not present

## 2021-03-19 DIAGNOSIS — R051 Acute cough: Secondary | ICD-10-CM | POA: Diagnosis not present

## 2021-03-21 DIAGNOSIS — Z20828 Contact with and (suspected) exposure to other viral communicable diseases: Secondary | ICD-10-CM | POA: Diagnosis not present

## 2021-04-09 DIAGNOSIS — Z20822 Contact with and (suspected) exposure to covid-19: Secondary | ICD-10-CM | POA: Diagnosis not present

## 2021-04-13 DIAGNOSIS — Z20822 Contact with and (suspected) exposure to covid-19: Secondary | ICD-10-CM | POA: Diagnosis not present

## 2021-04-15 DIAGNOSIS — Z20822 Contact with and (suspected) exposure to covid-19: Secondary | ICD-10-CM | POA: Diagnosis not present

## 2021-04-26 DIAGNOSIS — Z20822 Contact with and (suspected) exposure to covid-19: Secondary | ICD-10-CM | POA: Diagnosis not present

## 2021-04-27 ENCOUNTER — Encounter: Payer: Self-pay | Admitting: Family Medicine

## 2021-04-27 ENCOUNTER — Ambulatory Visit (INDEPENDENT_AMBULATORY_CARE_PROVIDER_SITE_OTHER): Payer: Medicare Other | Admitting: Family Medicine

## 2021-04-27 VITALS — BP 144/94 | HR 89 | Temp 97.5°F | Resp 16 | Wt 173.0 lb

## 2021-04-27 DIAGNOSIS — I10 Essential (primary) hypertension: Secondary | ICD-10-CM | POA: Diagnosis not present

## 2021-04-27 DIAGNOSIS — M170 Bilateral primary osteoarthritis of knee: Secondary | ICD-10-CM

## 2021-04-27 DIAGNOSIS — G4700 Insomnia, unspecified: Secondary | ICD-10-CM | POA: Diagnosis not present

## 2021-04-27 DIAGNOSIS — Z950 Presence of cardiac pacemaker: Secondary | ICD-10-CM

## 2021-04-27 DIAGNOSIS — I495 Sick sinus syndrome: Secondary | ICD-10-CM | POA: Diagnosis not present

## 2021-04-27 DIAGNOSIS — R42 Dizziness and giddiness: Secondary | ICD-10-CM

## 2021-04-27 DIAGNOSIS — G629 Polyneuropathy, unspecified: Secondary | ICD-10-CM

## 2021-04-27 DIAGNOSIS — E7800 Pure hypercholesterolemia, unspecified: Secondary | ICD-10-CM

## 2021-04-27 NOTE — Progress Notes (Signed)
?  ? ? ?Established patient visit ? ?I,Wesley Small,acting as a scribe for Wesley Durie, MD.,have documented all relevant documentation on the behalf of Wesley Durie, MD,as directed by  Wesley Durie, MD while in the presence of Wesley Durie, MD. ? ? ?Patient: Wesley Small   DOB: 1935/11/05   86 y.o. Male  MRN: [id number] ?Visit Date: 04/27/2021 ? ?Today's healthcare provider: [name], MD  ? ?Chief Complaint  ?Patient presents with  ? Follow-up  ? Diabetes  ? Hypertension  ? Hyperlipidemia  ? ?Subjective  ?  ?HPI  ?Patient comes in today for follow-up.  He has chronic low back pain and bilateral knee pain. ?Does have some dizziness at times which is intermittent.  He does not know when this can happen.  No syncope or presyncope. ?He has occasional right hand tremor ?He is becoming more unsteady in his gait as he ages. ?Blood pressure this morning read 140/90 ?Hypertension, follow-up ? ?BP Readings from Last 3 Encounters:  ?04/27/21 (!) 144/94  ?10/27/20 (!) 170/97  ?05/05/20 (!) 175/95  ? Wt Readings from Last 3 Encounters:  ?04/27/21 173 lb (78.5 kg)  ?10/27/20 171 lb (77.6 kg)  ?05/05/20 170 lb (77.1 kg)  ?  ? ?He was last seen for hypertension 7 months ago.  ?BP at that visit was 170/97. ?Management since that visit includes; Elevated blood pressures at home better than here.  Patient wishes no further intervention.Marland Kitchen ?He reports good compliance with treatment. ?He is not having side effects. none ?He is not exercising. ?He is adherent to low salt diet.   ?Outside blood pressures are 160/90. ? ?He does not smoke. ? ?Use of agents associated with hypertension: none.  ? ?--------------------------------------------------------------------------------------------------- ?Lipid/Cholesterol, follow-up ? ?Last Lipid Panel: ?Lab Results  ?Component Value Date  ? CHOL 176 10/29/2020  ? [place] 78 10/29/2020  ? HDL 45 10/29/2020  ? TRIG 329 (H) 10/29/2020  ? ? ?He was last seen for  this 7 months ago.  ?Management since that visit includes; on atorvastatin. ? ?He reports good compliance with treatment. ?He is not having side effects. none ? ?He is following a Regular, Low Sodium diet. ?Current exercise: none ? ?Last metabolic panel ?Lab Results  ?Component Value Date  ? GLUCOSE 94 10/29/2020  ? NA 146 (H) 10/29/2020  ? K 4.4 10/29/2020  ? BUN 11 10/29/2020  ? CREATININE 1.19 10/29/2020  ? EGFR 61 10/29/2020  ? GFRNONAA 62 10/11/2019  ? CALCIUM 9.2 10/29/2020  ? AST 36 10/29/2020  ? ALT 31 10/29/2020  ? ?The ASCVD Risk score (Arnett DK, et al., 2019) failed to calculate for the following reasons: ?  The 2019 ASCVD risk score is only valid for ages 58 to 55 ? ?--------------------------------------------------------------------------------------------------- ? ? ?Medications: ?Outpatient Medications Prior to Visit  ?Medication Sig  ? acetaminophen (TYLENOL) 500 MG tablet Take 500 mg by mouth every 6 (six) hours as needed for moderate pain or headache.  ? Alpha-Lipoic Acid 600 MG CAPS Take by mouth.  ? ascorbic acid (VITAMIN C) 500 MG tablet Take 500 mg by mouth daily.   ? aspirin 81 MG tablet Take 81 mg by mouth daily.   ? atorvastatin (LIPITOR) 10 MG tablet TAKE 1 TABLET BY MOUTH AT BEDTIME  ? b complex vitamins tablet Take 1 tablet by mouth daily.  ? Cholecalciferol (VITAMIN D3) 2000 units TABS Take 2,000 Units by mouth daily.  ? Coenzyme Q10 100 MG capsule Take 100 mg daily by  mouth.   ? losartan (COZAAR) 100 MG tablet TAKE 1 TABLET BY MOUTH ONCE DAILY  ? Magnesium Oxide 400 MG CAPS Take 400 mg by mouth daily.   ? metoprolol succinate (TOPROL-XL) 25 MG 24 hr tablet Take 25 mg by mouth daily.  ? Omega-3 Fatty Acids (SALMON OIL-1000 PO) Take 1,000 mg by mouth daily.  ? omeprazole (PRILOSEC) 20 MG capsule Take 20 mg by mouth at bedtime  ? traMADol (ULTRAM) 50 MG tablet TAKE 1 TABLET BY MOUTH TWICE (2) DAILY AS NEEDED FOR PAIN  ? vitamin B-12 (CYANOCOBALAMIN) 500 MCG tablet Take 500 mcg by mouth  daily.   ? Zinc 50 MG CAPS Take by mouth every other day.   ? zolpidem (AMBIEN) 10 MG tablet TAKE 1 TABLET BY MOUTH AT BEDTIME  ? [DISCONTINUED] gabapentin (NEURONTIN) 100 MG capsule Take 1 capsule (100 mg total) by mouth 2 (two) times daily. (Patient not taking: Reported on 04/27/2021)  ? [DISCONTINUED] hydrocortisone 2.5 % cream Apply topically 2 (two) times daily. (Patient not taking: Reported on 04/27/2021)  ? [DISCONTINUED] Metoprolol Succinate 50 MG CS24 Take 50 mg by mouth daily. (Patient not taking: Reported on 04/27/2021)  ? [DISCONTINUED] Naproxen 375 MG TBEC Take 1 tablet (375 mg total) by mouth 2 (two) times daily as needed. (Patient not taking: Reported on 04/27/2021)  ? [DISCONTINUED] NON FORMULARY Apply 1 mL topically every other day. Testosterone 20% 90gm  (Patient not taking: Reported on 04/27/2021)  ? ?No facility-administered medications prior to visit.  ? ? ?Review of Systems  ?Constitutional:  Negative for appetite change, chills and fever.  ?Respiratory:  Negative for chest tightness, shortness of breath and wheezing.   ?Cardiovascular:  Negative for chest pain and palpitations.  ?Gastrointestinal:  Negative for abdominal pain, nausea and vomiting.  ? ? ?  Objective  ?  ?BP (!) 144/94 (BP Location: Right Arm, Patient Position: Sitting, Cuff Size: Large)   Pulse 89   Temp (!) 97.5 ?F (36.4 ?C) (Temporal)   Resp 16   Wt 173 lb (78.5 kg)   SpO2 96%   BMI 24.82 kg/m?  ? ? ?Physical Exam ?Vitals reviewed.  ?Constitutional:   ?   Appearance: Normal appearance.  ?HENT:  ?   Head: Normocephalic and atraumatic.  ?   Right Ear: External ear normal.  ?   Left Ear: External ear normal.  ?Eyes:  ?   General: No scleral icterus. ?   Conjunctiva/sclera: Conjunctivae normal.  ?Neck:  ?   Vascular: No carotid bruit.  ?Cardiovascular:  ?   Rate and Rhythm: Normal rate and regular rhythm.  ?   Pulses: Normal pulses.  ?   Heart sounds: Normal heart sounds.  ?Pulmonary:  ?   Effort: Pulmonary effort is normal.  ?    Breath sounds: Normal breath sounds.  ?Abdominal:  ?   Palpations: Abdomen is soft.  ?Musculoskeletal:  ?   Cervical back: Normal range of motion.  ?   Right lower leg: No edema.  ?   Left lower leg: No edema.  ?Neurological:  ?   General: No focal deficit present.  ?   Mental Status: He is alert and oriented to person, place, and time.  ?   Comments: Slightly unsteady gait.  No hand tremor noted.  Either at rest or with intention. ?No nystagmus.  Nonfocal neurologic exam.  ?Psychiatric:     ?   Mood and Affect: Mood normal.     ?   Behavior: Behavior normal.     ?  Thought Content: Thought content normal.     ?   Judgment: Judgment normal.  ?  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 ?No results found for any visits on 04/27/21. ? Assessment & Plan  ?  ? ?1. Essential (primary) hypertension ?Fair control.  Check patient to check home blood pressures and we may need to add amlodipine or diuretic ?- Lipid panel ?- CBC w/Diff/Platelet ?- Comprehensive Metabolic Panel (CMET) ? ?2. Dizziness ?Uncertain etiology.  EKG today stable.  This symptom is intermittent and mild. ?He will discuss with his cardiologist.  Consider neurology referral for this and right hand tremor which is new and there is no tremor today. ?- EKG 12-Lead ? ?3. Hypercholesterolemia ?Atorvastatin 10 ?- Lipid panel ?- CBC w/Diff/Platelet ?- Comprehensive Metabolic Panel (CMET) ? ?4. Artificial cardiac pacemaker ?Followed by Dr. Ubaldo Glassing ? ?5. Sick sinus syndrome ([place]) ? ? ?6. Neuropathy ? ? ?7. Primary osteoarthritis of both knees ?Per orthopedics ? ?8. Insomnia, persistent ?Patient states he still needs his Ambien. ?He has a been advised many times of the fall risk. ? ? ?No follow-ups on file.  ?   ? ?I, Wesley Durie, MD, have reviewed all documentation for this visit. The documentation on 05/02/21 for the exam, diagnosis, procedures, and orders are all accurate and  complete. ? ? ? ?Raneshia Derick Cranford Mon, MD  ?[facility] ?[phone] (phone) ?[phone] (fax) ? ?[facility] ?

## 2021-04-28 DIAGNOSIS — E7800 Pure hypercholesterolemia, unspecified: Secondary | ICD-10-CM | POA: Diagnosis not present

## 2021-04-28 DIAGNOSIS — I10 Essential (primary) hypertension: Secondary | ICD-10-CM | POA: Diagnosis not present

## 2021-04-29 LAB — CBC WITH DIFFERENTIAL/PLATELET
Basophils Absolute: 0 10*3/uL (ref 0.0–0.2)
Basos: 1 %
EOS (ABSOLUTE): 0.3 10*3/uL (ref 0.0–0.4)
Eos: 4 %
Hematocrit: 44.8 % (ref 37.5–51.0)
Hemoglobin: 15.3 g/dL (ref 13.0–17.7)
Immature Grans (Abs): 0 10*3/uL (ref 0.0–0.1)
Immature Granulocytes: 0 %
Lymphocytes Absolute: 2.4 10*3/uL (ref 0.7–3.1)
Lymphs: 36 %
MCH: 33.8 pg — ABNORMAL HIGH (ref 26.6–33.0)
MCHC: 34.2 g/dL (ref 31.5–35.7)
MCV: 99 fL — ABNORMAL HIGH (ref 79–97)
Monocytes Absolute: 0.8 10*3/uL (ref 0.1–0.9)
Monocytes: 12 %
Neutrophils Absolute: 3.1 10*3/uL (ref 1.4–7.0)
Neutrophils: 47 %
Platelets: 231 10*3/uL (ref 150–450)
RBC: 4.53 x10E6/uL (ref 4.14–5.80)
RDW: 12 % (ref 11.6–15.4)
WBC: 6.6 10*3/uL (ref 3.4–10.8)

## 2021-04-29 LAB — COMPREHENSIVE METABOLIC PANEL
ALT: 26 IU/L (ref 0–44)
AST: 36 IU/L (ref 0–40)
Albumin/Globulin Ratio: 2.2 (ref 1.2–2.2)
Albumin: 4.2 g/dL (ref 3.6–4.6)
Alkaline Phosphatase: 80 IU/L (ref 44–121)
BUN/Creatinine Ratio: 11 (ref 10–24)
BUN: 14 mg/dL (ref 8–27)
Bilirubin Total: 0.7 mg/dL (ref 0.0–1.2)
CO2: 26 mmol/L (ref 20–29)
Calcium: 9.1 mg/dL (ref 8.6–10.2)
Chloride: 107 mmol/L — ABNORMAL HIGH (ref 96–106)
Creatinine, Ser: 1.26 mg/dL (ref 0.76–1.27)
Globulin, Total: 1.9 g/dL (ref 1.5–4.5)
Glucose: 100 mg/dL — ABNORMAL HIGH (ref 70–99)
Potassium: 4.2 mmol/L (ref 3.5–5.2)
Sodium: 147 mmol/L — ABNORMAL HIGH (ref 134–144)
Total Protein: 6.1 g/dL (ref 6.0–8.5)
eGFR: 57 mL/min/{1.73_m2} — ABNORMAL LOW (ref >59)

## 2021-04-29 LAB — LIPID PANEL
Chol/HDL Ratio: 3.7 ratio (ref 0.0–5.0)
Cholesterol, Total: 169 mg/dL (ref 100–199)
HDL: 46 mg/dL (ref >39)
LDL Chol Calc (NIH): 81 mg/dL (ref 0–99)
Triglycerides: 257 mg/dL — ABNORMAL HIGH (ref 0–149)
VLDL Cholesterol Cal: 42 mg/dL — ABNORMAL HIGH (ref 5–40)

## 2021-05-03 DIAGNOSIS — M170 Bilateral primary osteoarthritis of knee: Secondary | ICD-10-CM | POA: Diagnosis not present

## 2021-05-08 DIAGNOSIS — M170 Bilateral primary osteoarthritis of knee: Secondary | ICD-10-CM | POA: Insufficient documentation

## 2021-05-17 ENCOUNTER — Other Ambulatory Visit: Payer: Self-pay | Admitting: Family Medicine

## 2021-05-17 DIAGNOSIS — G4700 Insomnia, unspecified: Secondary | ICD-10-CM

## 2021-05-17 DIAGNOSIS — Z20822 Contact with and (suspected) exposure to covid-19: Secondary | ICD-10-CM | POA: Diagnosis not present

## 2021-05-18 DIAGNOSIS — I495 Sick sinus syndrome: Secondary | ICD-10-CM | POA: Diagnosis not present

## 2021-05-18 DIAGNOSIS — I10 Essential (primary) hypertension: Secondary | ICD-10-CM | POA: Diagnosis not present

## 2021-05-18 DIAGNOSIS — E782 Mixed hyperlipidemia: Secondary | ICD-10-CM | POA: Diagnosis not present

## 2021-05-18 DIAGNOSIS — Z950 Presence of cardiac pacemaker: Secondary | ICD-10-CM | POA: Diagnosis not present

## 2021-05-18 DIAGNOSIS — R0602 Shortness of breath: Secondary | ICD-10-CM | POA: Diagnosis not present

## 2021-06-03 DIAGNOSIS — M170 Bilateral primary osteoarthritis of knee: Secondary | ICD-10-CM | POA: Diagnosis not present

## 2021-06-10 DIAGNOSIS — M170 Bilateral primary osteoarthritis of knee: Secondary | ICD-10-CM | POA: Diagnosis not present

## 2021-06-16 DIAGNOSIS — R0602 Shortness of breath: Secondary | ICD-10-CM | POA: Diagnosis not present

## 2021-07-22 DIAGNOSIS — Z961 Presence of intraocular lens: Secondary | ICD-10-CM | POA: Diagnosis not present

## 2021-07-22 DIAGNOSIS — H43813 Vitreous degeneration, bilateral: Secondary | ICD-10-CM | POA: Diagnosis not present

## 2021-07-22 DIAGNOSIS — D3131 Benign neoplasm of right choroid: Secondary | ICD-10-CM | POA: Diagnosis not present

## 2021-08-09 DIAGNOSIS — M170 Bilateral primary osteoarthritis of knee: Secondary | ICD-10-CM | POA: Diagnosis not present

## 2021-09-10 ENCOUNTER — Other Ambulatory Visit: Payer: Self-pay | Admitting: Family Medicine

## 2021-09-10 DIAGNOSIS — G4700 Insomnia, unspecified: Secondary | ICD-10-CM

## 2021-09-14 NOTE — Telephone Encounter (Signed)
Requested medications are due for refill today.  yes  Requested medications are on the active medications list.  yes  Last refill. Both refilled 05/17/2021 with 4 month supply  Future visit scheduled.   yes  Notes to clinic.  Medication refills for both are not delegated.    Requested Prescriptions  Pending Prescriptions Disp Refills   zolpidem (AMBIEN) 10 MG tablet [Pharmacy Med Name: ZOLPIDEM TARTRATE 10 MG TAB] 30 tablet     Sig: TAKE 1 TABLET BY MOUTH AT BEDTIME     Not Delegated - Psychiatry:  Anxiolytics/Hypnotics Failed - 09/10/2021 10:34 AM      Failed - This refill cannot be delegated      Failed - Urine Drug Screen completed in last 360 days      Passed - Valid encounter within last 6 months    Recent Outpatient Visits           4 months ago Essential (primary) hypertension   [facility] Jerrol Banana., MD   10 months ago Medicare annual wellness visit, subsequent   [facility] Rosanna Randy, Retia Passe., MD   1 year ago Neuropathy   [facility] Jerrol Banana., MD   1 year ago Essential (primary) hypertension   [facility] Jerrol Banana., MD   1 year ago Essential (primary) hypertension   [facility] Jerrol Banana., MD       Future Appointments             In 1 month Jerrol Banana., MD [facility]             traMADol (ULTRAM) 50 MG tablet [Pharmacy Med Name: TRAMADOL HCL 50 MG TAB] 60 tablet     Sig: TAKE 1 TABLET BY MOUTH TWICE (2) DAILY AS NEEDED FOR PAIN     Not Delegated - Analgesics:  Opioid Agonists Failed - 09/10/2021 10:34 AM      Failed - This refill cannot be delegated      Failed - Urine Drug Screen completed in last 360 days      Failed - Valid encounter within last 3 months    Recent Outpatient Visits           4 months ago Essential (primary) hypertension   [facility] Jerrol Banana., MD   10 months ago Medicare annual wellness visit, subsequent   [facility] Jerrol Banana., MD   1 year ago Neuropathy   [facility] Jerrol Banana., MD   1 year ago Essential (primary) hypertension   [facility] Jerrol Banana., MD   1 year ago Essential (primary) hypertension   [facility] Jerrol Banana., MD       Future Appointments             In 1 month Jerrol Banana., MD [facility], [place]

## 2021-10-13 DIAGNOSIS — Z23 Encounter for immunization: Secondary | ICD-10-CM | POA: Diagnosis not present

## 2021-10-26 DIAGNOSIS — I495 Sick sinus syndrome: Secondary | ICD-10-CM | POA: Diagnosis not present

## 2021-10-27 ENCOUNTER — Ambulatory Visit: Payer: Medicare Other | Admitting: Family Medicine

## 2021-10-27 NOTE — Progress Notes (Signed)
=  I,[name],acting as a scribe for [employer], MD.,have documented all relevant documentation on the behalf of Wesley Foster, MD,as directed by  Wesley Foster, MD while in the presence of Wesley Foster, MD.     Established patient visit   Patient: Wesley Small   DOB: [birth year]   86 y.o. Male  MRN: [id number] Visit Date: 10/28/2021  Today's healthcare provider: [name], MD   Chief Complaint  Patient presents with   Follow-up Chronic Disease   Subjective    HPI  Hypertension, follow-up  BP Readings from Last 3 Encounters:  10/28/21 (!) 160/100  04/27/21 (!) 144/94  10/27/20 (!) 170/97   Wt Readings from Last 3 Encounters:  10/28/21 173 lb 1.6 oz (78.5 kg)  04/27/21 173 lb (78.5 kg)  10/27/20 171 lb (77.6 kg)     He was last seen for hypertension 6 months ago.  BP at that visit was 144/94. Management since that visit includes Continue Losartan $RemoveBeforeDEI'100mg'zFIpJisfgbjMrQTo$ . If home BP elevated may need to add amlodipine or diuretic He reports excellent compliance with treatment. He is not having side effects.  He is not exercising. He is not adherent to low salt diet.   Outside blood pressures are 140's/70's.  He does not smoke. Follows with Dr. Ubaldo Glassing, cardiology   --------------------------------------------------------------------------------------------------- Lipid/Cholesterol, follow-up  Last Lipid Panel: Lab Results  Component Value Date   CHOL 169 04/28/2021   LDLCALC 81 04/28/2021   HDL 46 04/28/2021   TRIG 257 (H) 04/28/2021    He was last seen for this 6 months ago.  Management since that visit includes Statin $RemoveBeforeD'10mg'[id number]$ .  He reports excellent compliance with treatment. He is not having side effects.   Symptoms: Yes appetite changes No foot ulcerations  No chest pain No chest pressure/discomfort  No dyspnea No orthopnea  Yes fatigue No lower extremity edema  No palpitations No paroxysmal  nocturnal dyspnea  No nausea Yes numbness or tingling of extremity  No polydipsia No polyuria  No speech difficulty No syncope     Last metabolic panel Lab Results  Component Value Date   GLUCOSE 100 (H) 04/28/2021   NA 147 (H) 04/28/2021   K 4.2 04/28/2021   BUN 14 04/28/2021   CREATININE 1.26 04/28/2021   EGFR 57 (L) 04/28/2021   GFRNONAA 62 10/11/2019   CALCIUM 9.1 04/28/2021   AST 36 04/28/2021   ALT 26 04/28/2021   The ASCVD Risk score (Arnett DK, et al., 2019) failed to calculate for the following reasons:   The 2019 ASCVD risk score is only valid for ages 13 to 38  ---------------------------------------------------------------------------------------------------     Tremor  Reports having a tremor in his hands and makes it difficult to sign checks  If he puts his hands on his knees the hands do not have a tremor  When he holds the hands out the tremor increases  The tremor is inconsistent  He reports having a fall while going down the steps  He denies hitting his head  The fall was three -4 weeks ago  He has been having dizziness as a chronic issue   -----------------------------------------------------------------------------------------  Medications: Outpatient Medications Prior to Visit  Medication Sig   acetaminophen (TYLENOL) 500 MG tablet Take 500 mg by mouth every 6 (six) hours as needed for moderate pain or headache.   Alpha-Lipoic Acid 600 MG CAPS Take by mouth.   ascorbic acid (VITAMIN C) 500 MG tablet Take 500 mg by mouth daily.    aspirin  81 MG tablet Take 81 mg by mouth daily.    atorvastatin (LIPITOR) 10 MG tablet TAKE 1 TABLET BY MOUTH AT BEDTIME   b complex vitamins tablet Take 1 tablet by mouth daily.   Boswellia Serrata (BOSWELLIA PO) Take 500 mg by mouth.   Cholecalciferol (VITAMIN D3) 2000 units TABS Take 2,000 Units by mouth daily.   Coenzyme Q10 100 MG capsule Take 100 mg daily by mouth.    losartan (COZAAR) 100 MG tablet TAKE 1 TABLET  BY MOUTH ONCE DAILY   Magnesium Oxide 400 MG CAPS Take 400 mg by mouth daily.    metoprolol succinate (TOPROL-XL) 25 MG 24 hr tablet Take 25 mg by mouth daily.   Omega-3 Fatty Acids (SALMON OIL-1000 PO) Take 1,000 mg by mouth daily.   omeprazole (PRILOSEC) 20 MG capsule Take 20 mg by mouth at bedtime   traMADol (ULTRAM) 50 MG tablet TAKE 1 TABLET BY MOUTH TWICE (2) DAILY AS NEEDED FOR PAIN   vitamin B-12 (CYANOCOBALAMIN) 500 MCG tablet Take 500 mcg by mouth daily.    Zinc 50 MG CAPS Take by mouth every other day.    zolpidem (AMBIEN) 10 MG tablet TAKE 1 TABLET BY MOUTH AT BEDTIME   No facility-administered medications prior to visit.    Review of Systems  Musculoskeletal:  Positive for gait problem.  Neurological:  Positive for dizziness.       Objective    BP (!) 160/100 (BP Location: Left Arm, Patient Position: Sitting, Cuff Size: Normal)   Pulse (!) 114   Temp 98.1 F (36.7 C) (Oral)   Resp 16   Wt 173 lb 1.6 oz (78.5 kg)   BMI 24.84 kg/m    Physical Exam Constitutional:      General: He is not in acute distress.    Appearance: Normal appearance. He is not ill-appearing, toxic-appearing or diaphoretic.  Cardiovascular:     Rate and Rhythm: Tachycardia present.     Heart sounds: Normal heart sounds.     No friction rub. No gallop.  Pulmonary:     Effort: Pulmonary effort is normal.     Breath sounds: Normal breath sounds.  Abdominal:     General: Bowel sounds are normal.     Palpations: Abdomen is soft.     Tenderness: There is no abdominal tenderness.  Musculoskeletal:     Cervical back: No tenderness.     Right lower leg: Edema present.     Left lower leg: Edema present.     Comments: 1+ pitting bilaterally in lower extremities   Neurological:     Mental Status: He is alert and oriented to person, place, and time.     Motor: No weakness.     Gait: Gait normal.     Comments: Negative pronator drift  Some dysmetria noted on finger to nose testing bilaterally   No asterixis  Normal heel to shin test  Looses balance with standing while eyes closed  Tremor of bilateral hands worst with left thumb  Does not demonstrate shuffling gait on observation, although has slow gait        No results found for any visits on 10/28/21.  Assessment & Plan     Problem List Items Addressed This Visit       Cardiovascular and Mediastinum   Essential (primary) hypertension    Chronic, uncontrolled  BP is elevated today  Patient is without HA, chest pain or vision changes  Recommended increasing metoprolol by 1/2 tablet until follow  up in our office on 10/24  Recommended checking BP one hour after medications and to see urgent care if he develops the above mentioned symptoms       Sick sinus syndrome ([place]) - Primary    Chronic, has pacemaker  Tachycardic in clinic with increased report of palpitations  Will increase metoprolol to 1.5 tabs for 37.52m daily  He is scheduled for cardiology visit in Nov 2023, however, will reach out to office of Dr. 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      I, MEulis Foster MD, have reviewed all documentation for this visit. The documentation on 10/28/21 for the exam, diagnosis, procedures, and orders are all accurate and complete.    MEulis Foster MD  [phone](phone) [phone](fax)  [place]

## 2021-10-28 ENCOUNTER — Ambulatory Visit (INDEPENDENT_AMBULATORY_CARE_PROVIDER_SITE_OTHER): Payer: Medicare Other | Admitting: Family Medicine

## 2021-10-28 ENCOUNTER — Encounter: Payer: Self-pay | Admitting: Family Medicine

## 2021-10-28 VITALS — BP 160/100 | HR 114 | Temp 98.1°F | Resp 16 | Wt 173.1 lb

## 2021-10-28 DIAGNOSIS — I10 Essential (primary) hypertension: Secondary | ICD-10-CM

## 2021-10-28 DIAGNOSIS — R251 Tremor, unspecified: Secondary | ICD-10-CM | POA: Diagnosis not present

## 2021-10-28 DIAGNOSIS — I495 Sick sinus syndrome: Secondary | ICD-10-CM | POA: Diagnosis not present

## 2021-10-28 DIAGNOSIS — R42 Dizziness and giddiness: Secondary | ICD-10-CM | POA: Diagnosis not present

## 2021-10-28 NOTE — Assessment & Plan Note (Signed)
Stable, persistent  Referral to neurology this occurred before increased palpitations

## 2021-10-28 NOTE — Assessment & Plan Note (Signed)
Chronic, has pacemaker  Tachycardic in clinic with increased report of palpitations  Will increase metoprolol to 1.5 tabs for 37.'5mg'$  daily  He is scheduled for cardiology visit in Nov 2023, however, will reach out to office of Dr. And see if this can be sooner given last note from cardiology detailing decreasing battery life  Discussed ED precautions including lightheadedness/presyncope, chest pain, worsening palpitations with SOB, patient voiced understanding  He will continue ASA 81

## 2021-10-28 NOTE — Assessment & Plan Note (Signed)
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

## 2021-10-28 NOTE — Assessment & Plan Note (Signed)
Chronic, uncontrolled  BP is elevated today  Patient is without HA, chest pain or vision changes  Recommended increasing metoprolol by 1/2 tablet until follow up in our office on 10/24  Recommended checking BP one hour after medications and to see urgent care if he develops the above mentioned symptoms

## 2021-10-29 ENCOUNTER — Other Ambulatory Visit: Payer: Self-pay | Admitting: Family Medicine

## 2021-10-29 LAB — CBC
Hematocrit: 43.6 % (ref 37.5–51.0)
Hemoglobin: 15.6 g/dL (ref 13.0–17.7)
MCH: 35.8 pg — ABNORMAL HIGH (ref 26.6–33.0)
MCHC: 35.8 g/dL — ABNORMAL HIGH (ref 31.5–35.7)
MCV: 100 fL — ABNORMAL HIGH (ref 79–97)
Platelets: 249 10*3/uL (ref 150–450)
RBC: 4.36 x10E6/uL (ref 4.14–5.80)
RDW: 14 % (ref 11.6–15.4)
WBC: 8.8 10*3/uL (ref 3.4–10.8)

## 2021-10-29 LAB — COMPREHENSIVE METABOLIC PANEL
ALT: 38 IU/L (ref 0–44)
AST: 51 IU/L — ABNORMAL HIGH (ref 0–40)
Albumin/Globulin Ratio: 2.1 (ref 1.2–2.2)
Albumin: 4.5 g/dL (ref 3.7–4.7)
Alkaline Phosphatase: 111 IU/L (ref 44–121)
BUN/Creatinine Ratio: 10 (ref 10–24)
BUN: 14 mg/dL (ref 8–27)
Bilirubin Total: 1.2 mg/dL (ref 0.0–1.2)
CO2: 21 mmol/L (ref 20–29)
Calcium: 9.6 mg/dL (ref 8.6–10.2)
Chloride: 103 mmol/L (ref 96–106)
Creatinine, Ser: 1.4 mg/dL — ABNORMAL HIGH (ref 0.76–1.27)
Globulin, Total: 2.1 g/dL (ref 1.5–4.5)
Glucose: 126 mg/dL — ABNORMAL HIGH (ref 70–99)
Potassium: 4.8 mmol/L (ref 3.5–5.2)
Sodium: 147 mmol/L — ABNORMAL HIGH (ref 134–144)
Total Protein: 6.6 g/dL (ref 6.0–8.5)
eGFR: 50 mL/min/{1.73_m2} — ABNORMAL LOW (ref >59)

## 2021-10-29 LAB — MAGNESIUM: Magnesium: 1.5 mg/dL — ABNORMAL LOW (ref 1.6–2.3)

## 2021-10-29 LAB — B12 AND FOLATE PANEL
Folate: >20 ng/mL (ref >3.0)
Vitamin B-12: 708 pg/mL (ref 232–1245)

## 2021-11-02 ENCOUNTER — Ambulatory Visit (INDEPENDENT_AMBULATORY_CARE_PROVIDER_SITE_OTHER): Payer: Medicare Other | Admitting: Family Medicine

## 2021-11-02 ENCOUNTER — Encounter: Payer: Self-pay | Admitting: Family Medicine

## 2021-11-02 VITALS — BP 158/102 | HR 77 | Temp 98.4°F | Resp 16 | Wt 178.1 lb

## 2021-11-02 DIAGNOSIS — I495 Sick sinus syndrome: Secondary | ICD-10-CM | POA: Diagnosis not present

## 2021-11-02 DIAGNOSIS — I10 Essential (primary) hypertension: Secondary | ICD-10-CM

## 2021-11-02 NOTE — Progress Notes (Signed)
I,[name],acting as a scribe for [employer], MD.,have documented all relevant documentation on the behalf of [name], MD,as directed by  [name], MD while in the presence of [name], MD.   Established patient visit   Patient: Wesley Small   DOB: 04/24/1935   86 y.o. Male  MRN: [id number] Visit Date: 11/02/2021  Today's healthcare provider: [name], MD   Chief Complaint  Patient presents with   Follow-up   Subjective    HPI Patient here for 5 day follow-up.  Sick Sinus Syndrome with Pacemaker  Next appt with cardiology scheduled for Nov 19 Last visit was noted to have heart rate elevation ranging in 111-115 with reported palpitations  His medication changes included  increase  metoprolol '75mg'$  daily(extra half tablet)  Reports BP reading at home have been 120'2-160/60's-upper 90's  Dizziness  He reports chronic dizziness.  He describes it as feeling unbalanced, occurs every other days, and typically last for a few seconds with standing.   It typically occurs when he is standing up from siting position.  It is usually relieved by standing still.  He was last evaluated for this one week ago and referred to neurology, no appt has been made as of yet   Associated symptoms: Yes hearing loss-wears hearing aids No tinnitus  No chest discomfort No heart palpitations  No heart racing No numbness or tingling of extremities  No nausea No vomiting  No speech difficulty No visual changes    Wt Readings from Last 3 Encounters:  11/02/21 178 lb 1.6 oz (80.8 kg)  10/28/21 173 lb 1.6 oz (78.5 kg)  04/27/21 173 lb (78.5 kg)    BP Readings from Last 3 Encounters:  11/02/21 (!) 158/102  10/28/21 (!) 160/100  04/27/21 (!) 144/94      Lab Results  Component Value Date   WBC 8.8 10/28/2021   HGB 15.6 10/28/2021   HCT 43.6 10/28/2021   MCV 100 (H) 10/28/2021   PLT 249 10/28/2021   Lab  Results  Component Value Date   NA 147 (H) 10/28/2021   K 4.8 10/28/2021   CO2 21 10/28/2021   BUN 14 10/28/2021   CREATININE 1.40 (H) 10/28/2021   CALCIUM 9.6 10/28/2021   GLUCOSE 126 (H) 10/28/2021     ---------------------------------------------------------------------------------------------------   Medications: Outpatient Medications Prior to Visit  Medication Sig   acetaminophen (TYLENOL) 500 MG tablet Take 500 mg by mouth every 6 (six) hours as needed for moderate pain or headache.   Alpha-Lipoic Acid 600 MG CAPS Take by mouth.   ascorbic acid (VITAMIN C) 500 MG tablet Take 500 mg by mouth daily.    aspirin 81 MG tablet Take 81 mg by mouth daily.    atorvastatin (LIPITOR) 10 MG tablet TAKE 1 TABLET BY MOUTH AT BEDTIME   b complex vitamins tablet Take 1 tablet by mouth daily.   Boswellia Serrata (BOSWELLIA PO) Take 500 mg by mouth.   Cholecalciferol (VITAMIN D3) 2000 units TABS Take 2,000 Units by mouth daily.   Coenzyme Q10 100 MG capsule Take 100 mg daily by mouth.    losartan (COZAAR) 100 MG tablet TAKE 1 TABLET BY MOUTH ONCE DAILY   Magnesium Oxide 400 MG CAPS Take 400 mg by mouth daily.    metoprolol succinate (TOPROL-XL) 25 MG 24 hr tablet Take 37.5 mg by mouth daily.   Omega-3 Fatty Acids (SALMON OIL-1000 PO) Take 1,000 mg by mouth daily.   omeprazole (PRILOSEC) 20  MG capsule Take 20 mg by mouth at bedtime   traMADol (ULTRAM) 50 MG tablet TAKE 1 TABLET BY MOUTH TWICE (2) DAILY AS NEEDED FOR PAIN   vitamin B-12 (CYANOCOBALAMIN) 500 MCG tablet Take 500 mcg by mouth daily.    Zinc 50 MG CAPS Take by mouth every other day.    zolpidem (AMBIEN) 10 MG tablet TAKE 1 TABLET BY MOUTH AT BEDTIME   No facility-administered medications prior to visit.    Review of Systems     Objective    BP (!) 158/102 (BP Location: Right Arm, Patient Position: Sitting, Cuff Size: Normal)   Pulse 77   Temp 98.4 F (36.9 C)   Resp 16   Wt 178 lb 1.6 oz (80.8 kg)   BMI 25.55 kg/m     Physical Exam Cardiovascular:     Rate and Rhythm: Normal rate. Rhythm irregular.     Pulses: Normal pulses.  Pulmonary:     Effort: Pulmonary effort is normal. No respiratory distress.     Breath sounds: Normal breath sounds. No wheezing, rhonchi or rales.  Abdominal:     General: There is no distension.     Palpations: Abdomen is soft.     Tenderness: There is no abdominal tenderness.  Musculoskeletal:     Right lower leg: Edema present.     Left lower leg: Edema present.       No results found for any visits on 11/02/21.   Assessment & Plan     Problem List Items Addressed This Visit       Cardiovascular and Mediastinum   Essential (primary) hypertension    Chronic, elevated but improved  Patient will continue losartan '100mg'$  daily, metoprolol 37.'5mg'$  Will follow up with cardiology as scheduled  No medication changes today       Sick sinus syndrome (HCC) - Primary    Chronic, stable  HR within goal range today  He denies frequent palpitations or feelings of lightheadedness since last visit  Encouraged to follow up as scheduled with cardiology  Continue current med including metoprolol, now 37.'5mg'$  from previous dose of '25mg'$  daily            Return in about 2 months (around 01/02/2022) for BP, HR.      I, [name], MD, have reviewed all documentation for this visit. The documentation on 11/05/21 for the exam, diagnosis, procedures, and orders are all accurate and complete.  Portions of this information were initially documented by the CMA and reviewed by me for thoroughness and accuracy.      [name], MD  [facility] [phone] (phone) [phone] (fax)  [place]

## 2021-11-05 NOTE — Assessment & Plan Note (Signed)
Chronic, elevated but improved  Patient will continue losartan '100mg'$  daily, metoprolol 37.'5mg'$  Will follow up with cardiology as scheduled  No medication changes today

## 2021-11-05 NOTE — Assessment & Plan Note (Signed)
Chronic, stable  HR within goal range today  He denies frequent palpitations or feelings of lightheadedness since last visit  Encouraged to follow up as scheduled with cardiology  Continue current med including metoprolol, now 37.'5mg'$  from previous dose of '25mg'$  daily

## 2021-11-15 ENCOUNTER — Other Ambulatory Visit: Payer: Self-pay | Admitting: Family Medicine

## 2021-11-15 DIAGNOSIS — I10 Essential (primary) hypertension: Secondary | ICD-10-CM

## 2021-11-15 MED ORDER — LOSARTAN POTASSIUM 100 MG PO TABS: 100.0000 mg | ORAL_TABLET | Freq: Every day | ORAL | 3 refills | Status: DC

## 2021-11-15 NOTE — Telephone Encounter (Signed)
[facility] faxed refill request for the following medications:    metoprolol succinate (TOPROL-XL) 25 MG 24 hr tablet     losartan (COZAAR) 100 MG tablet    Please advise

## 2021-11-18 ENCOUNTER — Telehealth: Payer: Self-pay | Admitting: Family Medicine

## 2021-11-18 MED ORDER — METOPROLOL SUCCINATE ER 25 MG PO TB24
37.5000 mg | ORAL_TABLET | Freq: Every day | ORAL | 1 refills | Status: DC
Start: 2021-11-18 — End: 2022-07-19

## 2021-11-18 NOTE — Telephone Encounter (Signed)
Received another refill request from [place] [facility] for metoprolol succinate (TOPROL-XL) 25 MG 24 hr tablet.

## 2021-11-18 NOTE — Addendum Note (Signed)
Addended by: Matilde Sprang on: 11/18/2021 10:32 AM   Modules accepted: Orders

## 2021-11-18 NOTE — Telephone Encounter (Signed)
metoprolol succinate (TOPROL-XL) 25 MG 24 hr tablet    Pt called requesting this Rx refill, wants to know why this was not filled along with Losartan?   Wants a call back today from the clinic to discuss.   [place]

## 2021-11-18 NOTE — Telephone Encounter (Signed)
Requested medication (s) are due for refill today: yes  Requested medication (s) are on the active medication list: Yes  Last refill:  04/17/21  Future visit scheduled: No  Notes to clinic:  Unable to refill per protocol, last refill by another provider. Patient is requesting a call back as to why this was not sent in on 11/6.     Requested Prescriptions  Pending Prescriptions Disp Refills   metoprolol succinate (TOPROL-XL) 25 MG 24 hr tablet      Sig: Take 1.5 tablets (37.5 mg total) by mouth daily.     Cardiovascular:  Beta Blockers Failed - 11/18/2021 10:32 AM      Failed - Last BP in normal range    BP Readings from Last 1 Encounters:  11/02/21 (!) 158/102         Passed - Last Heart Rate in normal range    Pulse Readings from Last 1 Encounters:  11/02/21 77         Passed - Valid encounter within last 6 months    Recent Outpatient Visits           2 weeks ago Sick sinus syndrome ([place])   [facility] Simmons-Robinson, Financial risk analyst, MD   3 weeks ago Sick sinus syndrome ([place])   [facility] Simmons-Robinson, [place], MD   6 months ago Essential (primary) hypertension   [facility] Jerrol Banana., MD   1 year ago Medicare annual wellness visit, subsequent   [facility] Jerrol Banana., MD   1 year ago Neuropathy   [facility] Jerrol Banana., MD              Signed Prescriptions Disp Refills   losartan (COZAAR) 100 MG tablet 90 tablet 3    Sig: Take 1 tablet (100 mg total) by mouth daily.     There is no refill protocol information for this order

## 2021-11-22 DIAGNOSIS — M170 Bilateral primary osteoarthritis of knee: Secondary | ICD-10-CM | POA: Diagnosis not present

## 2021-11-30 DIAGNOSIS — E782 Mixed hyperlipidemia: Secondary | ICD-10-CM | POA: Diagnosis not present

## 2021-11-30 DIAGNOSIS — I495 Sick sinus syndrome: Secondary | ICD-10-CM | POA: Diagnosis not present

## 2021-11-30 DIAGNOSIS — Z950 Presence of cardiac pacemaker: Secondary | ICD-10-CM | POA: Diagnosis not present

## 2021-11-30 DIAGNOSIS — I10 Essential (primary) hypertension: Secondary | ICD-10-CM | POA: Diagnosis not present

## 2021-12-08 ENCOUNTER — Telehealth: Payer: Self-pay | Admitting: Family Medicine

## 2021-12-08 DIAGNOSIS — G4700 Insomnia, unspecified: Secondary | ICD-10-CM

## 2021-12-08 MED ORDER — ZOLPIDEM TARTRATE 10 MG PO TABS: 10.0000 mg | ORAL_TABLET | Freq: Every day | ORAL | 0 refills | Status: DC

## 2021-12-08 NOTE — Telephone Encounter (Signed)
[facility] faxed refill request for the following medications:   zolpidem (AMBIEN) 10 MG tablet    Please advise

## 2021-12-08 NOTE — Telephone Encounter (Signed)
Refill sent for 1 month supply electronically   [name], MD  [facility]

## 2021-12-13 ENCOUNTER — Ambulatory Visit (INDEPENDENT_AMBULATORY_CARE_PROVIDER_SITE_OTHER): Payer: Medicare Other

## 2021-12-13 VITALS — BP 164/98 | Ht 67.0 in | Wt 176.3 lb

## 2021-12-13 DIAGNOSIS — Z0000 Encounter for general adult medical examination without abnormal findings: Secondary | ICD-10-CM | POA: Diagnosis not present

## 2021-12-13 NOTE — Progress Notes (Signed)
Subjective:   Wesley Small is a 86 y.o. male who presents for Medicare Annual/Subsequent preventive examination.  Review of Systems     Cardiac Risk Factors include: advanced age (>40mn, >>51women);male gender;dyslipidemia;hypertension     Objective:    Today's Vitals   12/13/21 1510  BP: (!) 164/98  Weight: 176 lb 4.8 oz (80 kg)  Height: '5\' 7"'$  (1.702 m)   Body mass index is 27.61 kg/m.     12/13/2021    3:21 PM 09/12/2019   10:47 AM 09/10/2018   10:37 AM 08/15/2017   10:48 AM 12/29/2016    8:52 AM 11/24/2016    7:29 AM 08/02/2016    9:48 AM  Advanced Directives  Does Patient Have a Medical Advance Directive? No Yes Yes Yes Yes No;Yes Yes  Type of ACorporate treasurerof AAugustaLiving will HHollisterLiving will HNoxapaterLiving will HRensselaerLiving will HDe SotoLiving will Living will;Healthcare Power of Attorney  Does patient want to make changes to medical advance directive?     No - Patient declined    Copy of HRobertsin Chart?  No - copy requested No - copy requested No - copy requested Yes  No - copy requested  Would patient like information on creating a medical advance directive? No - Patient declined     No - Patient declined     Current Medications (verified) Outpatient Encounter Medications as of 12/13/2021  Medication Sig   atorvastatin (LIPITOR) 10 MG tablet TAKE 1 TABLET BY MOUTH AT BEDTIME   b complex vitamins tablet Take 1 tablet by mouth daily.   Boswellia Serrata (BOSWELLIA PO) Take 500 mg by mouth.   Cholecalciferol (VITAMIN D3) 2000 units TABS Take 2,000 Units by mouth daily.   Magnesium Oxide 400 MG CAPS Take 400 mg by mouth daily.    metoprolol succinate (TOPROL-XL) 25 MG 24 hr tablet Take 1.5 tablets (37.5 mg total) by mouth daily.   omeprazole (PRILOSEC) 20 MG capsule Take 20 mg by mouth at bedtime   traMADol (ULTRAM) 50 MG tablet  TAKE 1 TABLET BY MOUTH TWICE (2) DAILY AS NEEDED FOR PAIN   Zinc 50 MG CAPS Take by mouth every other day.    zolpidem (AMBIEN) 10 MG tablet Take 1 tablet (10 mg total) by mouth at bedtime.   acetaminophen (TYLENOL) 500 MG tablet Take 500 mg by mouth every 6 (six) hours as needed for moderate pain or headache. (Patient not taking: Reported on 12/13/2021)   Alpha-Lipoic Acid 600 MG CAPS Take by mouth. (Patient not taking: Reported on 12/13/2021)   ascorbic acid (VITAMIN C) 500 MG tablet Take 500 mg by mouth daily.  (Patient not taking: Reported on 12/13/2021)   aspirin 81 MG tablet Take 81 mg by mouth daily.  (Patient not taking: Reported on 12/13/2021)   Coenzyme Q10 100 MG capsule Take 100 mg daily by mouth.  (Patient not taking: Reported on 12/13/2021)   losartan (COZAAR) 100 MG tablet Take 1 tablet (100 mg total) by mouth daily. (Patient not taking: Reported on 12/13/2021)   Omega-3 Fatty Acids (SALMON OIL-1000 PO) Take 1,000 mg by mouth daily. (Patient not taking: Reported on 12/13/2021)   vitamin B-12 (CYANOCOBALAMIN) 500 MCG tablet Take 500 mcg by mouth daily.  (Patient not taking: Reported on 12/13/2021)   No facility-administered encounter medications on file as of 12/13/2021.    Allergies (verified) Penicillins   History: Past  Medical History:  Diagnosis Date   Arthritis    Dysrhythmia    RBBB   GERD (gastroesophageal reflux disease)    History of blood transfusion    History of diverticulitis    History of hiatal hernia    HOH (hard of hearing)    Hypercholesterolemia    Hypertension    Presence of permanent cardiac pacemaker    Sleep apnea    Past Surgical History:  Procedure Laterality Date   back injection     CATARACT EXTRACTION W/PHACO Right 11/24/2016   Procedure: CATARACT EXTRACTION PHACO AND INTRAOCULAR LENS PLACEMENT ([place]);  Surgeon: Eulogio Bear, MD;  Location: [facility] ORS;  Service: Ophthalmology;  Laterality: Right;  Lot # [id number] H [place]:   00.31  AP%: 10.9 CDE:   3.39   CATARACT EXTRACTION W/PHACO Left 12/29/2016   Procedure: CATARACT EXTRACTION PHACO AND INTRAOCULAR LENS PLACEMENT ([place]);  Surgeon: Eulogio Bear, MD;  Location: [facility] ORS;  Service: Ophthalmology;  Laterality: Left;  [place] 00:33.8 AP% 8.5 CDE 2.86 Fluid Pack lot # [id number]   INJECTION KNEE     INJECTION KNEE     PACEMAKER INSERTION     SKIN GRAFT     TONSILLECTOMY AND ADENOIDECTOMY     TRANSURETHRAL RESECTION OF PROSTATE     Family History  Problem Relation Age of Onset   Heart disease Father    Social History   Socioeconomic History   Marital status: Widowed    Spouse name: Not on file   Number of children: 1   Years of education: Not on file   Highest education level: Some college, no degree  Occupational History   Occupation: retired  Tobacco Use   Smoking status: Former    Types: Cigarettes   Smokeless tobacco: Never   Tobacco comments:    quit around [employer] Use   Vaping Use: Never used  Substance and Sexual Activity   Alcohol use: Yes    Alcohol/week: 7.0 standard drinks of alcohol    Types: 7 Shots of liquor per week    Comment: 2 oz a day of high ball "bourbon"   Drug use: No   Sexual activity: Not on file  Other Topics Concern   Not on file  Social History Narrative   Not on file   Social Determinants of Health   Financial Resource Strain: Low Risk  (12/13/2021)   Overall Financial Resource Strain (CARDIA)    Difficulty of Paying Living Expenses: Not hard at all  Food Insecurity: No Food Insecurity (12/13/2021)   Hunger Vital Sign    Worried About Running Out of Food in the Last Year: Never true    Ran Out of Food in the Last Year: Never true  Transportation Needs: No Transportation Needs (12/13/2021)   PRAPARE - Hydrologist (Medical): No    Lack of Transportation (Non-Medical): No  Physical Activity: Insufficiently Active (12/13/2021)   Exercise Vital Sign    Days of Exercise per Week: 2 days    Minutes of  Exercise per Session: 20 min  Stress: No Stress Concern Present (12/13/2021)   [place]    Feeling of Stress : Not at all  Social Connections: Socially Isolated (12/13/2021)   Social Connection and Isolation Panel [NHANES]    Frequency of Communication with Friends and Family: More than three times a week    Frequency of Social Gatherings with Friends and Family: Twice a  week    Attends Religious Services: Never    Active Member of Clubs or Organizations: No    Attends Archivist Meetings: Never    Marital Status: Widowed    Tobacco Counseling Counseling given: Not Answered Tobacco comments: quit around 1970   Clinical Intake:  Pre-visit preparation completed: Yes  Pain : No/denies pain     Diabetes: No  How often do you need to have someone help you when you read instructions, pamphlets, or other written materials from your doctor or pharmacy?: 1 - Never  Diabetic?no  Interpreter Needed?: No  Information entered by :: Kirke Shaggy, LPN   Activities of Daily Living    12/13/2021    3:23 PM 10/28/2021   11:17 AM  In your present state of health, do you have any difficulty performing the following activities:  Hearing? 1 0  Vision? 0 0  Difficulty concentrating or making decisions? 0 0  Walking or climbing stairs? 0 1  Dressing or bathing? 0 0  Doing errands, shopping? 0 0  Preparing Food and eating ? N   Using the Toilet? N   In the past six months, have you accidently leaked urine? N   Do you have problems with loss of bowel control? N   Managing your Medications? N   Managing your Finances? N   Housekeeping or managing your Housekeeping? N     Patient Care Team: [name], MD as PCP - General (Family Medicine) Eulogio Bear, MD as Consulting Physician (Ophthalmology) Thornton Park, MD as Referring Physician (Orthopedic Surgery) Dimmig, Marcello Moores, MD as  Referring Physician (Orthopedic Surgery) Teodoro Spray, MD as Consulting Physician (Cardiology) Ralene Bathe, MD as Consulting Physician (Dermatology)  Indicate any recent Medical Services you may have received from other than [facility] providers in the past year (date may be approximate).     Assessment:   This is a routine wellness examination for Ellie.  Hearing/Vision screen Hearing Screening - Comments:: Wears aids Vision Screening - Comments:: Wears glasses- Dr.King  Dietary issues and exercise activities discussed: Current Exercise Habits: Home exercise routine, Type of exercise: walking, Time (Minutes): 20, Frequency (Times/Week): 2, Weekly Exercise (Minutes/Week): 40, Intensity: Mild   Goals Addressed             This Visit's Progress    DIET - EAT MORE FRUITS AND VEGETABLES         Depression Screen    12/13/2021    3:18 PM 10/28/2021   11:16 AM 10/27/2020   10:27 AM 09/12/2019   10:40 AM 09/10/2018   10:38 AM 11/23/2017    2:16 PM 08/15/2017   10:48 AM  PHQ 2/9 Scores  PHQ - 2 Score 0 1 0 0 0 0 0  PHQ- 9 Score 0 '4 1   1     '$ Fall Risk    12/13/2021    3:21 PM 10/28/2021   11:15 AM 10/27/2020   10:27 AM 09/12/2019   10:48 AM 11/08/2018    9:48 AM  Fall Risk   Falls in the past year? '1 1 1 '$ 0 0  Number falls in past yr: 0 0 0 0 0  Injury with Fall? 0 1 0 0 0  Risk for fall due to : History of fall(s) Other (Comment)     Risk for fall due to: Comment  Vertigo     Follow up Falls evaluation completed;Falls prevention discussed Falls evaluation completed   Falls evaluation completed  FALL RISK PREVENTION PERTAINING TO THE HOME:  Any stairs in or around the home? Yes  If so, are there any without handrails? No  Home free of loose throw rugs in walkways, pet beds, electrical cords, etc? Yes  Adequate lighting in your home to reduce risk of falls? Yes   ASSISTIVE DEVICES UTILIZED TO PREVENT FALLS:  Life alert? No  Use of a cane, walker or w/c? No   Grab bars in the bathroom? Yes  Shower chair or bench in shower? Yes  Elevated toilet seat or a handicapped toilet? No   TIMED UP AND GO:  Was the test performed? Yes .  Length of time to ambulate 10 feet: 4 sec.   Gait steady and fast without use of assistive device  Cognitive Function:        12/13/2021    3:29 PM 09/12/2019   10:54 AM 09/10/2018   10:47 AM 08/02/2016    9:53 AM  6CIT Screen  What Year? 0 points 0 points 0 points 0 points  What month? 0 points 0 points 0 points 0 points  What time? 0 points 0 points 0 points 0 points  Count back from 20 0 points 0 points 0 points 0 points  Months in reverse 2 points 2 points 0 points 0 points  Repeat phrase 2 points 0 points 0 points 0 points  Total Score 4 points 2 points 0 points 0 points    Immunizations Immunization History  Administered Date(s) Administered   Fluad Quad(high Dose 65+) 10/27/2020   Influenza, High Dose Seasonal PF 11/10/2014, 11/04/2015, 10/09/2017, 10/19/2018   PFIZER Comirnaty(Gray Top)Covid-19 Tri-Sucrose Vaccine 02/10/2019, 03/05/2019   PFIZER(Purple Top)SARS-COV-2 Vaccination 02/10/2019, 03/05/2019   Pneumococcal Conjugate-13 05/05/2014   Pneumococcal Polysaccharide-23 08/02/2016   Tdap 10/12/2010   Zoster Recombinat (Shingrix) 11/10/2016, 01/26/2017    TDAP status: Due, Education has been provided regarding the importance of this vaccine. Advised may receive this vaccine at local pharmacy or Health Dept. Aware to provide a copy of the vaccination record if obtained from local pharmacy or Health Dept. Verbalized acceptance and understanding.  Flu Vaccine status: Up to date  Pneumococcal vaccine status: Up to date  Covid-19 vaccine status: Completed vaccines  Qualifies for Shingles Vaccine? Yes   Zostavax completed No   Shingrix Completed?: Yes  Screening Tests Health Maintenance  Topic Date Due   DTaP/Tdap/Td (2 - Td or Tdap) 10/11/2020   INFLUENZA VACCINE  08/10/2021   COVID-19  Vaccine (5 - 2023-24 season) 09/10/2021   Medicare Annual Wellness (AWV)  12/14/2022   Pneumonia Vaccine 1+ Years old  Completed   Zoster Vaccines- Shingrix  Completed   HPV VACCINES  Aged Out    Health Maintenance  Health Maintenance Due  Topic Date Due   DTaP/Tdap/Td (2 - Td or Tdap) 10/11/2020   INFLUENZA VACCINE  08/10/2021   COVID-19 Vaccine (5 - 2023-24 season) 09/10/2021    Colorectal cancer screening: No longer required.   Lung Cancer Screening: (Low Dose CT Chest recommended if Age 55-80 years, 30 pack-year currently smoking OR have quit w/in 15years.) does not qualify.   Additional Screening:  Hepatitis C Screening: does not qualify; Completed no  Vision Screening: Recommended annual ophthalmology exams for early detection of glaucoma and other disorders of the eye. Is the patient up to date with their annual eye exam?  Yes  Who is the provider or what is the name of the office in which the patient attends annual eye exams? [facility]  If pt is not established with a provider, would they like to be referred to a provider to establish care? No .   Dental Screening: Recommended annual dental exams for proper oral hygiene  Community Resource Referral / Chronic Care Management: CRR required this visit?  No   CCM required this visit?  No      Plan:     I have personally reviewed and noted the following in the patient's chart:   Medical and social history Use of alcohol, tobacco or illicit drugs  Current medications and supplements including opioid prescriptions. Patient is not currently taking opioid prescriptions. Functional ability and status Nutritional status Physical activity Advanced directives List of other physicians Hospitalizations, surgeries, and ER visits in previous 12 months Vitals Screenings to include cognitive, depression, and falls Referrals and appointments  In addition, I have reviewed and discussed with patient certain preventive  protocols, quality metrics, and best practice recommendations. A written personalized care plan for preventive services as well as general preventive health recommendations were provided to patient.     Dionisio David, LPN   [id number]   Nurse Notes: none

## 2021-12-13 NOTE — Patient Instructions (Signed)
Wesley Small , Thank you for taking time to come for your Medicare Wellness Visit. I appreciate your ongoing commitment to your health goals. Please review the following plan we discussed and let me know if I can assist you in the future.   Screening recommendations/referrals: Colonoscopy: aged out Recommended yearly ophthalmology/optometry visit for glaucoma screening and checkup Recommended yearly dental visit for hygiene and checkup  Vaccinations: Influenza vaccine: 10/27/20 Pneumococcal vaccine: 08/02/16 Tdap vaccine: 10/12/10, due if have injury Shingles vaccine: Shingrix 11/10/16, 01/26/17   Covid-19: 02/10/19, 03/05/19  Advanced directives: yes  Conditions/risks identified: no  Next appointment: Follow up in one year for your annual wellness visit. 12/15/22 @ 1:45 pm in person  Preventive Care 65 Years and Older, Male Preventive care refers to lifestyle choices and visits with your health care provider that can promote health and wellness. What does preventive care include? A yearly physical exam. This is also called an annual well check. Dental exams once or twice a year. Routine eye exams. Ask your health care provider how often you should have your eyes checked. Personal lifestyle choices, including: Daily care of your teeth and gums. Regular physical activity. Eating a healthy diet. Avoiding tobacco and drug use. Limiting alcohol use. Practicing safe sex. Taking low doses of aspirin every day. Taking vitamin and mineral supplements as recommended by your health care provider. What happens during an annual well check? The services and screenings done by your health care provider during your annual well check will depend on your age, overall health, lifestyle risk factors, and family history of disease. Counseling  Your health care provider may ask you questions about your: Alcohol use. Tobacco use. Drug use. Emotional well-being. Home and relationship  well-being. Sexual activity. Eating habits. History of falls. Memory and ability to understand (cognition). Work and work Statistician. Screening  You may have the following tests or measurements: Height, weight, and BMI. Blood pressure. Lipid and cholesterol levels. These may be checked every 5 years, or more frequently if you are over 53 years old. Skin check. Lung cancer screening. You may have this screening every year starting at age 17 if you have a 30-pack-year history of smoking and currently smoke or have quit within the past 15 years. Fecal occult blood test (FOBT) of the stool. You may have this test every year starting at age 32. Flexible sigmoidoscopy or colonoscopy. You may have a sigmoidoscopy every 5 years or a colonoscopy every 10 years starting at age 84. Prostate cancer screening. Recommendations will vary depending on your family history and other risks. Hepatitis C blood test. Hepatitis B blood test. Sexually transmitted disease (STD) testing. Diabetes screening. This is done by checking your blood sugar (glucose) after you have not eaten for a while (fasting). You may have this done every 1-3 years. Abdominal aortic aneurysm (AAA) screening. You may need this if you are a current or former smoker. Osteoporosis. You may be screened starting at age 64 if you are at high risk. Talk with your health care provider about your test results, treatment options, and if necessary, the need for more tests. Vaccines  Your health care provider may recommend certain vaccines, such as: Influenza vaccine. This is recommended every year. Tetanus, diphtheria, and acellular pertussis (Tdap, Td) vaccine. You may need a Td booster every 10 years. Zoster vaccine. You may need this after age 3. Pneumococcal 13-valent conjugate (PCV13) vaccine. One dose is recommended after age 61. Pneumococcal polysaccharide (PPSV23) vaccine. One dose is recommended after age  29. Talk to your health care  provider about which screenings and vaccines you need and how often you need them. This information is not intended to replace advice given to you by your health care provider. Make sure you discuss any questions you have with your health care provider. Document Released: 01/23/2015 Document Revised: 09/16/2015 Document Reviewed: 10/28/2014 Elsevier Interactive Patient Education  2017 [place] Prevention in the Home Falls can cause injuries. They can happen to people of all ages. There are many things you can do to make your home safe and to help prevent falls. What can I do on the outside of my home? Regularly fix the edges of walkways and driveways and fix any cracks. Remove anything that might make you trip as you walk through a door, such as a raised step or threshold. Trim any bushes or trees on the path to your home. Use bright outdoor lighting. Clear any walking paths of anything that might make someone trip, such as rocks or tools. Regularly check to see if handrails are loose or broken. Make sure that both sides of any steps have handrails. Any raised decks and porches should have guardrails on the edges. Have any leaves, snow, or ice cleared regularly. Use sand or salt on walking paths during winter. Clean up any spills in your garage right away. This includes oil or grease spills. What can I do in the bathroom? Use night lights. Install grab bars by the toilet and in the tub and shower. Do not use towel bars as grab bars. Use non-skid mats or decals in the tub or shower. If you need to sit down in the shower, use a plastic, non-slip stool. Keep the floor dry. Clean up any water that spills on the floor as soon as it happens. Remove soap buildup in the tub or shower regularly. Attach bath mats securely with double-sided non-slip rug tape. Do not have throw rugs and other things on the floor that can make you trip. What can I do in the bedroom? Use night lights. Make  sure that you have a light by your bed that is easy to reach. Do not use any sheets or blankets that are too big for your bed. They should not hang down onto the floor. Have a firm chair that has side arms. You can use this for support while you get dressed. Do not have throw rugs and other things on the floor that can make you trip. What can I do in the kitchen? Clean up any spills right away. Avoid walking on wet floors. Keep items that you use a lot in easy-to-reach places. If you need to reach something above you, use a strong step stool that has a grab bar. Keep electrical cords out of the way. Do not use floor polish or wax that makes floors slippery. If you must use wax, use non-skid floor wax. Do not have throw rugs and other things on the floor that can make you trip. What can I do with my stairs? Do not leave any items on the stairs. Make sure that there are handrails on both sides of the stairs and use them. Fix handrails that are broken or loose. Make sure that handrails are as long as the stairways. Check any carpeting to make sure that it is firmly attached to the stairs. Fix any carpet that is loose or worn. Avoid having throw rugs at the top or bottom of the stairs. If you do have  throw rugs, attach them to the floor with carpet tape. Make sure that you have a light switch at the top of the stairs and the bottom of the stairs. If you do not have them, ask someone to add them for you. What else can I do to help prevent falls? Wear shoes that: Do not have high heels. Have rubber bottoms. Are comfortable and fit you well. Are closed at the toe. Do not wear sandals. If you use a stepladder: Make sure that it is fully opened. Do not climb a closed stepladder. Make sure that both sides of the stepladder are locked into place. Ask someone to hold it for you, if possible. Clearly mark and make sure that you can see: Any grab bars or handrails. First and last steps. Where the  edge of each step is. Use tools that help you move around (mobility aids) if they are needed. These include: Canes. Walkers. Scooters. Crutches. Turn on the lights when you go into a dark area. Replace any light bulbs as soon as they burn out. Set up your furniture so you have a clear path. Avoid moving your furniture around. If any of your floors are uneven, fix them. If there are any pets around you, be aware of where they are. Review your medicines with your doctor. Some medicines can make you feel dizzy. This can increase your chance of falling. Ask your doctor what other things that you can do to help prevent falls. This information is not intended to replace advice given to you by your health care provider. Make sure you discuss any questions you have with your health care provider. Document Released: 10/23/2008 Document Revised: 06/04/2015 Document Reviewed: 01/31/2014 Elsevier Interactive Patient Education  2017 [employer].

## 2022-01-04 ENCOUNTER — Other Ambulatory Visit: Payer: Self-pay | Admitting: Family Medicine

## 2022-01-04 DIAGNOSIS — G4700 Insomnia, unspecified: Secondary | ICD-10-CM

## 2022-01-04 NOTE — Telephone Encounter (Signed)
Requested medication (s) are due for refill today: yes   Requested medication (s) are on the active medication list: yes   Last refill:  12/08/21 #30   Future visit scheduled: no  Notes to clinic:  Please review for refill. Refill not delegated per protocol    Requested Prescriptions  Pending Prescriptions Disp Refills   zolpidem (AMBIEN) 10 MG tablet [Pharmacy Med Name: ZOLPIDEM TARTRATE 10 MG TAB] 30 tablet     Sig: TAKE 1 TABLET BY MOUTH EVERY NIGHT AT BEDTIME     Not Delegated - Psychiatry:  Anxiolytics/Hypnotics Failed - 01/04/2022  9:12 AM      Failed - This refill cannot be delegated      Failed - Urine Drug Screen completed in last 360 days      Passed - Valid encounter within last 6 months    Recent Outpatient Visits           2 months ago Sick sinus syndrome ([place])   [facility] Simmons-Robinson, Financial risk analyst, MD   2 months ago Sick sinus syndrome ([place])   [facility] Simmons-Robinson, [place], MD   8 months ago Essential (primary) hypertension   [facility] Jerrol Banana., MD   1 year ago Medicare annual wellness visit, subsequent   [facility] Jerrol Banana., MD   1 year ago Neuropathy   [facility] Jerrol Banana., MD

## 2022-01-24 ENCOUNTER — Telehealth: Payer: Self-pay | Admitting: Family Medicine

## 2022-01-24 MED ORDER — TRAMADOL HCL 50 MG PO TABS: ORAL_TABLET | ORAL | 0 refills | Status: DC

## 2022-01-24 NOTE — Telephone Encounter (Signed)
[place] faxed refill request for the following medications:   traMADol (ULTRAM) 50 MG tablet    Please advise

## 2022-01-24 NOTE — Telephone Encounter (Signed)
Refill for tramdol sent to pharmacy for 30 day supply   [name], MD  [facility]

## 2022-01-29 ENCOUNTER — Other Ambulatory Visit: Payer: Self-pay | Admitting: Family Medicine

## 2022-01-29 DIAGNOSIS — G4700 Insomnia, unspecified: Secondary | ICD-10-CM

## 2022-02-01 ENCOUNTER — Telehealth: Payer: Self-pay | Admitting: Family Medicine

## 2022-02-01 ENCOUNTER — Other Ambulatory Visit: Payer: Self-pay

## 2022-02-01 MED ORDER — ATORVASTATIN CALCIUM 10 MG PO TABS: 10.0000 mg | ORAL_TABLET | Freq: Every day | ORAL | 1 refills | Status: DC

## 2022-02-01 NOTE — Telephone Encounter (Signed)
[facility] faxed refill request for the following medications:      atorvastatin (LIPITOR) 10 MG tablet   Please advise

## 2022-02-01 NOTE — Telephone Encounter (Signed)
Refill sent.

## 2022-02-28 ENCOUNTER — Other Ambulatory Visit: Payer: Self-pay | Admitting: Family Medicine

## 2022-02-28 DIAGNOSIS — G4700 Insomnia, unspecified: Secondary | ICD-10-CM

## 2022-02-28 NOTE — Telephone Encounter (Signed)
You have seen this patient twice and one was acute and BP follow-up but not for a regular  chronic follow-up. I am not sure if you need to see patient first or send a courtesy refill

## 2022-03-10 ENCOUNTER — Other Ambulatory Visit: Payer: Self-pay

## 2022-03-10 ENCOUNTER — Telehealth: Payer: Self-pay | Admitting: Family Medicine

## 2022-03-10 MED ORDER — TRAMADOL HCL 50 MG PO TABS: ORAL_TABLET | ORAL | 0 refills | Status: DC

## 2022-03-10 NOTE — Telephone Encounter (Signed)
LOV 11/02/21 NOV none LRF 01/24/22 60 x 0

## 2022-03-10 NOTE — Telephone Encounter (Signed)
[place] faxed refill request for the following medications:   traMADol (ULTRAM) 50 MG tablet    Please advise.

## 2022-03-11 ENCOUNTER — Other Ambulatory Visit: Payer: Self-pay | Admitting: Family Medicine

## 2022-03-11 DIAGNOSIS — M170 Bilateral primary osteoarthritis of knee: Secondary | ICD-10-CM | POA: Diagnosis not present

## 2022-03-11 MED ORDER — TRAMADOL HCL 50 MG PO TABS: ORAL_TABLET | ORAL | 0 refills | Status: DC

## 2022-03-18 DIAGNOSIS — M170 Bilateral primary osteoarthritis of knee: Secondary | ICD-10-CM | POA: Diagnosis not present

## 2022-03-23 NOTE — Progress Notes (Signed)
I,[name],acting as a scribe for [employer], MD.,have documented all relevant documentation on the behalf of [name], MD,as directed by  [name], MD while in the presence of [name], MD.   Established patient visit   Patient: Wesley Small   DOB: Oct 10, 1935   86 y.o. Male  MRN: [id number] Visit Date: 03/24/2022  Today's healthcare provider: [name], MD   No chief complaint on file.  Subjective    HPI  Follow up for insomnia  Patient currently on Ambien 10 mg.  He reports excellent compliance with treatment. He feels that condition is Improved. He is not having side effects.  Reports that his insurance company has sent a letter regarding the use of this medication  He states that in the past he has tried other agents that did not help him sleep as well as the [place] He reports that he had an adverse reaction to prior medication that he reports made him crazy  Pt has received letter from well care requesting extension for coverage of Lorrin Mais he requests to have this addressed  Fax [phone] Ph [phone] -----------------------------------------------------------------------------------------  Chronic Pain, Knee OA  Patient has been taking Tramadol for bilateral knee OA  He reports taking this medication once times per day. Reports that even sometimes he only take half a pill per day Pain is improved with this regimen some.  He received bilateral steroid injections for his knees on 03/18/22 with his orthopedic provider (EMR review)  HTN Elevated chronically, consistent w/ chart review of prior vitals  Patient denies HA, blurry vision, chest discomfort  States that his BP is always elevated  He is waiting to establish with new cardiologist due to changes in the clinic staff for his cardiology office  Patient reports adherence to BB and valsartan 160mg   He prefers to avoid  adding medication that will likely make him feel worse due to side effects  He states that he has tried several medications over the years without much improvement in his blood pressure    Medications: Outpatient Medications Prior to Visit  Medication Sig   metoprolol succinate (TOPROL-XL) 25 MG 24 hr tablet Take 1.5 tablets (37.5 mg total) by mouth daily.   traMADol (ULTRAM) 50 MG tablet TAKE 1 TABLET BY MOUTH TWICE (2 times) DAILY AS NEEDED FOR PAIN   valsartan (DIOVAN) 160 MG tablet Take 160 mg by mouth daily.   [DISCONTINUED] zolpidem (AMBIEN) 10 MG tablet TAKE 1 TABLET BY MOUTH EVERY NIGHT AT BEDTIME   atorvastatin (LIPITOR) 10 MG tablet Take 1 tablet (10 mg total) by mouth at bedtime.   [DISCONTINUED] acetaminophen (TYLENOL) 500 MG tablet Take 500 mg by mouth every 6 (six) hours as needed for moderate pain or headache.   [DISCONTINUED] Alpha-Lipoic Acid 600 MG CAPS Take by mouth.   [DISCONTINUED] ascorbic acid (VITAMIN C) 500 MG tablet Take 500 mg by mouth daily.   [DISCONTINUED] aspirin 81 MG tablet Take 81 mg by mouth daily.   [DISCONTINUED] b complex vitamins tablet Take 1 tablet by mouth daily.   [DISCONTINUED] Boswellia Serrata (BOSWELLIA PO) Take 500 mg by mouth.   [DISCONTINUED] Cholecalciferol (VITAMIN D3) 2000 units TABS Take 2,000 Units by mouth daily.   [DISCONTINUED] Coenzyme Q10 100 MG capsule Take 100 mg by mouth daily.   [DISCONTINUED] losartan (COZAAR) 100 MG tablet Take 1 tablet (100 mg total) by mouth daily.   [DISCONTINUED] Magnesium Oxide 400 MG CAPS Take 400 mg by mouth daily.    [  DISCONTINUED] Omega-3 Fatty Acids (SALMON OIL-1000 PO) Take 1,000 mg by mouth daily.   [DISCONTINUED] omeprazole (PRILOSEC) 20 MG capsule Take 20 mg by mouth at bedtime   [DISCONTINUED] vitamin B-12 (CYANOCOBALAMIN) 500 MCG tablet Take 500 mcg by mouth daily.   [DISCONTINUED] Zinc 50 MG CAPS Take by mouth every other day.    No facility-administered medications prior to visit.     Review of Systems     Objective    BP (!) 178/87 (BP Location: Right Arm, Patient Position: Sitting, Cuff Size: Normal)   Pulse 63   Resp 16   Wt 178 lb (80.7 kg)   BMI 27.88 kg/m    Physical Exam Vitals reviewed.  Constitutional:      General: He is not in acute distress.    Appearance: Normal appearance. He is not ill-appearing, toxic-appearing or diaphoretic.  Eyes:     Conjunctiva/sclera: Conjunctivae normal.  Neck:     Thyroid: No thyroid mass, thyromegaly or thyroid tenderness.     Vascular: No carotid bruit.  Cardiovascular:     Rate and Rhythm: Normal rate and regular rhythm.     Pulses: Normal pulses.     Heart sounds: Normal heart sounds. No murmur heard.    No friction rub. No gallop.  Pulmonary:     Effort: Pulmonary effort is normal. No respiratory distress.     Breath sounds: Normal breath sounds. No stridor. No wheezing, rhonchi or rales.  Abdominal:     General: Bowel sounds are normal. There is no distension.     Palpations: Abdomen is soft.     Tenderness: There is no abdominal tenderness.  Musculoskeletal:     Right lower leg: No edema.     Left lower leg: No edema.  Lymphadenopathy:     Cervical: No cervical adenopathy.  Skin:    Findings: No erythema or rash.  Neurological:     Mental Status: He is alert and oriented to person, place, and time.       No results found for any visits on 03/24/22.  Assessment & Plan     Problem List Items Addressed This Visit       Cardiovascular and Mediastinum   Essential (primary) hypertension    Chronic  Uncontrolled Chronically elevated despite medical therapy  Patient follows with cardiology, although is waiting to find out who will be taking over his care due to his recent cardiologist moving from the clinic He is asymptomatic today  He will continue metoprolol 25mg  daily and valsartan 160mg  daily  Recommended that he monitor home BP until next follow up  Reports home records that are  consistently elevated as well        Relevant Medications   valsartan (DIOVAN) 160 MG tablet     Musculoskeletal and Integument   Osteoarthritis of knees, bilateral - Primary    Chronic  Follows with orthopedics, has had steroid injections over the years  Has been on chronic PRN tramadol 50mg  BID, continues to use this medication  Continue current regimen            Other   Insomnia, persistent    Chronic use of Ambien  Had long discussion today on the use of this medication in patient 65 years and older, reviewed side effects  Patient states he has been doing well on this medication for >20 years and states it helps him to sleep  Advised patient that I require 3 month follow up while on controlled medications to  continuously assess medication efficacy and safety  Patient is requesting a letter to support continued use of this medication as requested by his insurance company Will submit updated prescription and have patient follow up in 3 months        Relevant Medications   zolpidem (AMBIEN) 10 MG tablet     Return in about 3 months (around 06/24/2022) for HTN, ambien, tramadol.        The entirety of the information documented in the History of Present Illness, Review of Systems and Physical Exam were personally obtained by me. Portions of this information were initially documented by Lyndel Pleasure, CMA . I, [name], MD have reviewed the documentation above for thoroughness and accuracy.   [name], MD  [facility] [phone] (phone) [phone] (fax)  [place]

## 2022-03-24 ENCOUNTER — Encounter: Payer: Self-pay | Admitting: Family Medicine

## 2022-03-24 ENCOUNTER — Ambulatory Visit (INDEPENDENT_AMBULATORY_CARE_PROVIDER_SITE_OTHER): Payer: Medicare Other | Admitting: Family Medicine

## 2022-03-24 VITALS — BP 178/87 | HR 63 | Resp 16 | Wt 178.0 lb

## 2022-03-24 DIAGNOSIS — I10 Essential (primary) hypertension: Secondary | ICD-10-CM

## 2022-03-24 DIAGNOSIS — G4700 Insomnia, unspecified: Secondary | ICD-10-CM

## 2022-03-24 DIAGNOSIS — M170 Bilateral primary osteoarthritis of knee: Secondary | ICD-10-CM | POA: Diagnosis not present

## 2022-03-24 MED ORDER — ZOLPIDEM TARTRATE 10 MG PO TABS: 10.0000 mg | ORAL_TABLET | Freq: Every day | ORAL | 0 refills | Status: DC

## 2022-03-24 NOTE — Patient Instructions (Signed)
Your blood pressure was elevated today   I will submit documentation with your next refill for the Ambien to continue the medication

## 2022-03-25 NOTE — Assessment & Plan Note (Signed)
Chronic  Uncontrolled Chronically elevated despite medical therapy  Patient follows with cardiology, although is waiting to find out who will be taking over his care due to his recent cardiologist moving from the clinic He is asymptomatic today  He will continue metoprolol 25mg  daily and valsartan 160mg  daily  Recommended that he monitor home BP until next follow up  Reports home records that are consistently elevated as well

## 2022-03-25 NOTE — Assessment & Plan Note (Addendum)
Chronic  Follows with orthopedics, has had steroid injections over the years  Has been on chronic PRN tramadol 50mg  BID, continues to use this medication  Continue current regimen

## 2022-03-25 NOTE — Assessment & Plan Note (Signed)
Chronic use of Ambien  Had long discussion today on the use of this medication in patient 5 years and older, reviewed side effects  Patient states he has been doing well on this medication for >20 years and states it helps him to sleep  Advised patient that I require 3 month follow up while on controlled medications to continuously assess medication efficacy and safety  Patient is requesting a letter to support continued use of this medication as requested by his insurance company Will submit updated prescription and have patient follow up in 3 months

## 2022-03-29 ENCOUNTER — Other Ambulatory Visit: Payer: Self-pay | Admitting: Family Medicine

## 2022-03-29 DIAGNOSIS — G4700 Insomnia, unspecified: Secondary | ICD-10-CM

## 2022-04-11 ENCOUNTER — Other Ambulatory Visit: Payer: Self-pay | Admitting: Family Medicine

## 2022-05-02 ENCOUNTER — Other Ambulatory Visit: Payer: Self-pay | Admitting: Family Medicine

## 2022-05-02 DIAGNOSIS — G4700 Insomnia, unspecified: Secondary | ICD-10-CM

## 2022-05-04 DIAGNOSIS — I495 Sick sinus syndrome: Secondary | ICD-10-CM | POA: Diagnosis not present

## 2022-05-04 DIAGNOSIS — Z950 Presence of cardiac pacemaker: Secondary | ICD-10-CM | POA: Diagnosis not present

## 2022-05-12 ENCOUNTER — Other Ambulatory Visit: Payer: Self-pay | Admitting: Family Medicine

## 2022-05-30 ENCOUNTER — Other Ambulatory Visit: Payer: Self-pay | Admitting: Family Medicine

## 2022-05-30 DIAGNOSIS — G4700 Insomnia, unspecified: Secondary | ICD-10-CM

## 2022-05-31 ENCOUNTER — Other Ambulatory Visit: Payer: Self-pay | Admitting: Family Medicine

## 2022-05-31 DIAGNOSIS — G4700 Insomnia, unspecified: Secondary | ICD-10-CM

## 2022-05-31 NOTE — Telephone Encounter (Signed)
Requested medication (s) are due for refill today - duplicate request- already filled  Requested medication (s) are on the active medication list -yes  Future visit scheduled -yes  Last refill: 05/30/22 #30  Notes to clinic: non delegated Rx- duplicate request  Requested Prescriptions  Pending Prescriptions Disp Refills   zolpidem (AMBIEN) 10 MG tablet [Pharmacy Med Name: ZOLPIDEM TARTRATE 10MG  TABLET] 30 tablet 0    Sig: TAKE ONE TABLET (10 MG TOTAL) BY MOUTH AT BEDTIME.     Not Delegated - Psychiatry:  Anxiolytics/Hypnotics Failed - 05/31/2022  9:37 AM      Failed - This refill cannot be delegated      Failed - Urine Drug Screen completed in last 360 days      Passed - Valid encounter within last 6 months    Recent Outpatient Visits           2 months ago Primary osteoarthritis of both knees   [facility] [facility] Simmons-Robinson, [place], MD   7 months ago Sick sinus syndrome [facility])   [place] [facility] Simmons-Robinson, [place], MD   7 months ago Sick sinus syndrome [facility])   [place] [facility] Simmons-Robinson, Tawanna Cooler, MD   1 year ago Essential (primary) hypertension   [place] [facility] Bosie Clos, MD   1 year ago Medicare annual wellness visit, subsequent   [place] [facility] Bosie Clos, MD       Future Appointments             In 3 weeks Simmons-Robinson, Tawanna Cooler, MD [facility]               Requested Prescriptions  Pending Prescriptions Disp Refills   zolpidem (AMBIEN) 10 MG tablet [Pharmacy Med Name: ZOLPIDEM TARTRATE 10MG  TABLET] 30 tablet 0    Sig: TAKE ONE TABLET (10 MG TOTAL) BY MOUTH AT BEDTIME.     Not Delegated - Psychiatry:  Anxiolytics/Hypnotics Failed - 05/31/2022  9:37 AM      Failed - This refill cannot be delegated      Failed - Urine Drug Screen completed in last 360 days      Passed - Valid  encounter within last 6 months    Recent Outpatient Visits           2 months ago Primary osteoarthritis of both knees   [place] [facility] Simmons-Robinson, [place], MD   7 months ago Sick sinus syndrome [facility])   [place] [facility] Simmons-Robinson, [place], MD   7 months ago Sick sinus syndrome [facility])   [place] [facility] Simmons-Robinson, Tawanna Cooler, MD   1 year ago Essential (primary) hypertension   [place] [facility] Bosie Clos, MD   1 year ago Medicare annual wellness visit, subsequent   [place] [facility] Bosie Clos, MD       Future Appointments             In 3 weeks Simmons-Robinson, Tawanna Cooler, MD [facility]

## 2022-06-08 ENCOUNTER — Other Ambulatory Visit: Payer: Self-pay | Admitting: Family Medicine

## 2022-06-22 DIAGNOSIS — M170 Bilateral primary osteoarthritis of knee: Secondary | ICD-10-CM | POA: Diagnosis not present

## 2022-06-27 ENCOUNTER — Ambulatory Visit (INDEPENDENT_AMBULATORY_CARE_PROVIDER_SITE_OTHER): Payer: Medicare Other | Admitting: Family Medicine

## 2022-06-27 ENCOUNTER — Encounter: Payer: Self-pay | Admitting: Family Medicine

## 2022-06-27 ENCOUNTER — Other Ambulatory Visit: Payer: Self-pay | Admitting: Family Medicine

## 2022-06-27 VITALS — BP 178/97 | HR 70 | Wt 174.2 lb

## 2022-06-27 DIAGNOSIS — G4700 Insomnia, unspecified: Secondary | ICD-10-CM

## 2022-06-27 DIAGNOSIS — M170 Bilateral primary osteoarthritis of knee: Secondary | ICD-10-CM | POA: Diagnosis not present

## 2022-06-27 DIAGNOSIS — I10 Essential (primary) hypertension: Secondary | ICD-10-CM | POA: Diagnosis not present

## 2022-06-27 DIAGNOSIS — G629 Polyneuropathy, unspecified: Secondary | ICD-10-CM | POA: Diagnosis not present

## 2022-06-27 DIAGNOSIS — R739 Hyperglycemia, unspecified: Secondary | ICD-10-CM | POA: Diagnosis not present

## 2022-06-27 DIAGNOSIS — R5382 Chronic fatigue, unspecified: Secondary | ICD-10-CM | POA: Diagnosis not present

## 2022-06-27 MED ORDER — HYDROCHLOROTHIAZIDE 12.5 MG PO CAPS
12.50 mg | ORAL_CAPSULE | Freq: Every day | ORAL | 1 refills | Status: DC
Start: 2022-06-27 — End: 2022-07-19

## 2022-06-27 NOTE — Assessment & Plan Note (Signed)
Chronically elevated  Added microzide 12.5mg  daily  Continue valsartan 160mg  daily and metoprolol 37.5mg  daily  Recommended checking BP 1 hour after medications  F/u in 3 weeks for HTN

## 2022-06-27 NOTE — Assessment & Plan Note (Addendum)
Temporary handicap placard given today for difficulty ambulating long distances 2/2 to knee pain in setting of bilateral arthritis  Continue tramadol 50 mg twice daily as needed

## 2022-06-27 NOTE — Progress Notes (Signed)
I,[name],acting as a Neurosurgeon for [employer], MD.,have documented all relevant documentation on the behalf of [name], MD,as directed by  [name], MD while in the presence of [name], MD.   Established patient visit   Patient: Wesley Small   DOB: 28-Nov-1935   87 y.o. Male  MRN: [id number] Visit Date: 06/27/2022  Today's healthcare provider: [name], MD   No chief complaint on file.  Subjective    HPI   Bilateral knee arthritis Patient reports chronic bilateral knee pain States that tramadol helps him with the pain States that he recently had bilateral knee injections and is still experiencing some soreness with bearing weight on his lower extremities States that he cannot walk for long distances due to pain and is requesting a handicap placard to be able to park closer  Fatigue -Patient reports having no energy at all and wondering if it can be any deficiencies Patient is requesting lab work to look for any iron deficiency He denies any other symptoms such as temperature intolerance, abdominal pain, headaches, diarrhea or constipation or changes to his skin   Hypertension, follow-up  BP Readings from Last 3 Encounters:  06/27/22 (!) 178/97  03/24/22 (!) 178/87  12/13/21 (!) 164/98   Wt Readings from Last 3 Encounters:  06/27/22 174 lb 3.2 oz (79 kg)  03/24/22 178 lb (80.7 kg)  12/13/21 176 lb 4.8 oz (80 kg)     He was last seen for hypertension 3 months ago.  BP at that visit was 178/87. Management since that visit includes continue metoprolol 25mg  daily and valsartan 160mg  daily .  Outside blood pressures are 155/85. Symptoms: No chest pain No chest pressure  No palpitations No syncope  No dyspnea No orthopnea  No paroxysmal nocturnal dyspnea Yes lower extremity edema   Pertinent labs Lab Results  Component Value Date   CHOL 169 04/28/2021   HDL 46 04/28/2021    LDLCALC 81 04/28/2021   TRIG 257 (H) 04/28/2021   CHOLHDL 3.7 04/28/2021   Lab Results  Component Value Date   NA 147 (H) 10/28/2021   K 4.8 10/28/2021   CREATININE 1.40 (H) 10/28/2021   EGFR 50 (L) 10/28/2021   GLUCOSE 126 (H) 10/28/2021   TSH 1.820 10/29/2020     The ASCVD Risk score (Arnett DK, et al., 2019) failed to calculate for the following reasons:   The 2019 ASCVD risk score is only valid for ages 59 to 30 ---------------------------------------------------------------------------------------------------  Follow up for Insomnia  The patient was last seen for this 3 months ago. Changes made at last visit include continue ambien.  He reports excellent compliance with treatment. He feels that condition is Unchanged. He is not having side effects.  -----------------------------------------------------------------------------------------   Medications: Outpatient Medications Prior to Visit  Medication Sig   atorvastatin (LIPITOR) 10 MG tablet Take 1 tablet (10 mg total) by mouth at bedtime.   metoprolol succinate (TOPROL-XL) 25 MG 24 hr tablet Take 1.5 tablets (37.5 mg total) by mouth daily.   traMADol (ULTRAM) 50 MG tablet TAKE ONE TABLET BY MOUTH TWICE A DAY AS NEEDED FOR PAIN   valsartan (DIOVAN) 160 MG tablet Take 160 mg by mouth daily.   zolpidem (AMBIEN) 10 MG tablet TAKE ONE TABLET (10 MG TOTAL) BY MOUTH AT BEDTIME.   No facility-administered medications prior to visit.    Review of Systems     Objective    BP (!) 178/97 (BP Location: Right Arm, Patient Position:  Sitting, Cuff Size: Normal)   Pulse 70   Wt 174 lb 3.2 oz (79 kg)   SpO2 99%   BMI 27.28 kg/m    Physical Exam Vitals reviewed.  Constitutional:      General: He is not in acute distress.    Appearance: Normal appearance. He is not ill-appearing, toxic-appearing or diaphoretic.  Eyes:     Conjunctiva/sclera: Conjunctivae normal.  Cardiovascular:     Rate and Rhythm: Normal rate and  regular rhythm.     Pulses: Normal pulses.     Heart sounds: Normal heart sounds. No murmur heard.    No friction rub. No gallop.  Pulmonary:     Effort: Pulmonary effort is normal. No respiratory distress.     Breath sounds: Normal breath sounds. No stridor. No wheezing, rhonchi or rales.  Abdominal:     General: Bowel sounds are normal. There is no distension.     Palpations: Abdomen is soft.     Tenderness: There is no abdominal tenderness.  Neurological:     Mental Status: He is alert and oriented to person, place, and time.       No results found for any visits on 06/27/22.  Assessment & Plan     Problem List Items Addressed This Visit       Cardiovascular and Mediastinum   Essential (primary) hypertension - Primary    Chronically elevated  Added microzide 12.5mg  daily  Continue valsartan 160mg  daily and metoprolol 37.5mg  daily  Recommended checking BP 1 hour after medications  F/u in 3 weeks for HTN       Relevant Medications   hydrochlorothiazide (MICROZIDE) 12.5 MG capsule   Other Relevant Orders   Comprehensive metabolic panel     Musculoskeletal and Integument   Osteoarthritis of knees, bilateral    Temporary handicap placard given today for difficulty ambulating long distances 2/2 to knee pain in setting of bilateral arthritis  Continue tramadol 50 mg twice daily as needed        Other   Insomnia, persistent    Chronic  Stable and improved  Continue Ambien 10mg  nightly        Chronic fatigue    Chronic problem Reports having little to no energy We will test CMP, CBC, iron panel, A1c and TSH and free T4 as well as vitamin B12 and folate,      Relevant Orders   Hemoglobin A1c   TSH + free T4   CBC   Comprehensive metabolic panel   Iron, TIBC and Ferritin Panel   Other Visit Diagnoses     Neuropathy       Relevant Orders   Hemoglobin A1c   TSH + free T4   CBC   Iron, TIBC and Ferritin Panel   B12 and Folate Panel   Hyperglycemia,  unspecified       Relevant Orders   Hemoglobin A1c        Return in about 3 weeks (around 07/18/2022) for HTN, fatiue.        The entirety of the information documented in the History of Present Illness, Review of Systems and Physical Exam were personally obtained by me. Portions of this information were initially documented by Acey Lav. I, [name], MD have reviewed the documentation above for thoroughness and accuracy.      [name], MD  [facility] [phone] (phone) [phone] (fax)  [facility]

## 2022-06-27 NOTE — Assessment & Plan Note (Signed)
Chronic problem Reports having little to no energy We will test CMP, CBC, iron panel, A1c and TSH and free T4 as well as vitamin B12 and folate,

## 2022-06-27 NOTE — Patient Instructions (Signed)
Please continue Valsartan 160mg  and Metoprolol   I have added a small dose of hydrochlorothiazide to help with your elevated blood pressure.   Please continue to monitor your blood pressures after taking your medications  Goal BP is less than 140/90  We will follow up with results of labs once they are available.    Please follow up in three weeks for fatigue and blood pressure

## 2022-06-27 NOTE — Assessment & Plan Note (Addendum)
Chronic  Stable and improved  Continue Ambien 10mg  nightly

## 2022-06-28 ENCOUNTER — Other Ambulatory Visit: Payer: Self-pay | Admitting: Family Medicine

## 2022-06-28 DIAGNOSIS — R7989 Other specified abnormal findings of blood chemistry: Secondary | ICD-10-CM

## 2022-06-28 LAB — COMPREHENSIVE METABOLIC PANEL
ALT: 63 IU/L — ABNORMAL HIGH (ref 0–44)
AST: 57 IU/L — ABNORMAL HIGH (ref 0–40)
Albumin: 4.3 g/dL (ref 3.7–4.7)
Alkaline Phosphatase: 79 IU/L (ref 44–121)
BUN/Creatinine Ratio: 27 — ABNORMAL HIGH (ref 10–24)
BUN: 39 mg/dL — ABNORMAL HIGH (ref 8–27)
Bilirubin Total: 1.1 mg/dL (ref 0.0–1.2)
CO2: 25 mmol/L (ref 20–29)
Calcium: 9.7 mg/dL (ref 8.6–10.2)
Chloride: 101 mmol/L (ref 96–106)
Creatinine, Ser: 1.47 mg/dL — ABNORMAL HIGH (ref 0.76–1.27)
Globulin, Total: 2.1 g/dL (ref 1.5–4.5)
Glucose: 96 mg/dL (ref 70–99)
Potassium: 5.1 mmol/L (ref 3.5–5.2)
Sodium: 142 mmol/L (ref 134–144)
Total Protein: 6.4 g/dL (ref 6.0–8.5)
eGFR: 46 mL/min/{1.73_m2} — ABNORMAL LOW (ref >59)

## 2022-06-28 LAB — IRON,TIBC AND FERRITIN PANEL
Ferritin: 1019 ng/mL — ABNORMAL HIGH (ref 30–400)
Iron Saturation: 37 % (ref 15–55)
Iron: 128 ug/dL (ref 38–169)
Total Iron Binding Capacity: 345 ug/dL (ref 250–450)
UIBC: 217 ug/dL (ref 111–343)

## 2022-06-28 LAB — CBC
Hematocrit: 42.5 % (ref 37.5–51.0)
Hemoglobin: 14.7 g/dL (ref 13.0–17.7)
MCH: 36.9 pg — ABNORMAL HIGH (ref 26.6–33.0)
MCHC: 34.6 g/dL (ref 31.5–35.7)
MCV: 107 fL — ABNORMAL HIGH (ref 79–97)
Platelets: 278 10*3/uL (ref 150–450)
RBC: 3.98 x10E6/uL — ABNORMAL LOW (ref 4.14–5.80)
RDW: 13.8 % (ref 11.6–15.4)
WBC: 12.1 10*3/uL — ABNORMAL HIGH (ref 3.4–10.8)

## 2022-06-28 LAB — HEMOGLOBIN A1C
Est. average glucose Bld gHb Est-mCnc: 108 mg/dL
Hgb A1c MFr Bld: 5.4 % (ref 4.8–5.6)

## 2022-06-28 LAB — B12 AND FOLATE PANEL
Folate: 8.1 ng/mL (ref >3.0)
Vitamin B-12: 460 pg/mL (ref 232–1245)

## 2022-06-28 LAB — TSH+FREE T4
Free T4: 1.84 ng/dL — ABNORMAL HIGH (ref 0.82–1.77)
TSH: 2.17 u[IU]/mL (ref 0.450–4.500)

## 2022-06-30 ENCOUNTER — Telehealth: Payer: Self-pay

## 2022-06-30 DIAGNOSIS — R748 Abnormal levels of other serum enzymes: Secondary | ICD-10-CM

## 2022-06-30 DIAGNOSIS — R7989 Other specified abnormal findings of blood chemistry: Secondary | ICD-10-CM

## 2022-06-30 DIAGNOSIS — R7401 Elevation of levels of liver transaminase levels: Secondary | ICD-10-CM

## 2022-06-30 NOTE — Telephone Encounter (Signed)
-----   Message from [name], MD sent at 06/28/2022 12:55 PM EDT ----- Thyroid numbers slightly elevated but do not recommend treatment for mild elevation of T4 only with normal TSH   Kidney levels are elevated at 1.47. I would recommend increasing oral hydration and rechecking levels in 6 weeks   Liver enzymes mildly elevated. Recommend RUS to evaluate in setting of severely elevated ferritin of 1019 and repeat AST and ALT along with hepatitis panel within the next week.   Normal A1c, no diabetes   White blood cell count was elevated mildly  Normal B12 and folate

## 2022-07-10 ENCOUNTER — Other Ambulatory Visit: Payer: Self-pay

## 2022-07-10 ENCOUNTER — Emergency Department
Admission: EM | Admit: 2022-07-10 | Discharge: 2022-07-10 | Disposition: A | Discharge status: Home / Self Care | Payer: Medicare Other | Attending: Emergency Medicine | Admitting: Emergency Medicine

## 2022-07-10 DIAGNOSIS — W1830XA Fall on same level, unspecified, initial encounter: Secondary | ICD-10-CM | POA: Diagnosis not present

## 2022-07-10 DIAGNOSIS — I959 Hypotension, unspecified: Secondary | ICD-10-CM | POA: Diagnosis not present

## 2022-07-10 DIAGNOSIS — R55 Syncope and collapse: Secondary | ICD-10-CM | POA: Diagnosis not present

## 2022-07-10 DIAGNOSIS — R42 Dizziness and giddiness: Secondary | ICD-10-CM | POA: Diagnosis present

## 2022-07-10 DIAGNOSIS — E860 Dehydration: Secondary | ICD-10-CM | POA: Insufficient documentation

## 2022-07-10 DIAGNOSIS — W19XXXA Unspecified fall, initial encounter: Secondary | ICD-10-CM | POA: Diagnosis not present

## 2022-07-10 DIAGNOSIS — I4510 Unspecified right bundle-branch block: Secondary | ICD-10-CM | POA: Diagnosis not present

## 2022-07-10 LAB — COMPREHENSIVE METABOLIC PANEL
ALT: 44 U/L (ref 0–44)
AST: 32 U/L (ref 15–41)
Albumin: 3.6 g/dL (ref 3.5–5.0)
Alkaline Phosphatase: 63 U/L (ref 38–126)
Anion gap: 14 (ref 5–15)
BUN: 53 mg/dL — ABNORMAL HIGH (ref 8–23)
CO2: 29 mmol/L (ref 22–32)
Calcium: 9 mg/dL (ref 8.9–10.3)
Chloride: 99 mmol/L (ref 98–111)
Creatinine, Ser: 1.57 mg/dL — ABNORMAL HIGH (ref 0.61–1.24)
GFR, Estimated: 43 mL/min — ABNORMAL LOW (ref >60)
Glucose, Bld: 98 mg/dL (ref 70–99)
Potassium: 3.4 mmol/L — ABNORMAL LOW (ref 3.5–5.1)
Sodium: 142 mmol/L (ref 135–145)
Total Bilirubin: 0.7 mg/dL (ref 0.3–1.2)
Total Protein: 6.2 g/dL — ABNORMAL LOW (ref 6.5–8.1)

## 2022-07-10 LAB — CBC
HCT: 39.2 % (ref 39.0–52.0)
Hemoglobin: 13.3 g/dL (ref 13.0–17.0)
MCH: 36.4 pg — ABNORMAL HIGH (ref 26.0–34.0)
MCHC: 33.9 g/dL (ref 30.0–36.0)
MCV: 107.4 fL — ABNORMAL HIGH (ref 80.0–100.0)
Platelets: 231 10*3/uL (ref 150–400)
RBC: 3.65 MIL/uL — ABNORMAL LOW (ref 4.22–5.81)
RDW: 11.9 % (ref 11.5–15.5)
WBC: 9.1 10*3/uL (ref 4.0–10.5)
nRBC: 0 % (ref 0.0–0.2)

## 2022-07-10 LAB — TROPONIN I (HIGH SENSITIVITY): Troponin I (High Sensitivity): 14 ng/L (ref <18)

## 2022-07-10 MED ORDER — SODIUM CHLORIDE 0.9 % IV SOLN: Freq: Once | INTRAVENOUS | Status: AC

## 2022-07-10 NOTE — ED Provider Notes (Signed)
[facility] Provider Note    Event Date/Time   First MD Initiated Contact with Patient 07/10/22 [id number]     (approximate)   History   Fall   HPI  Wesley Small is a 87 y.o. male with a history of postural dizziness who presents after a fall.  Patient reports he stood up quickly when the doorbell rang became lightheaded and fell to the floor.  He denies injury.  Feels at his baseline currently.  He says he frequently gets dizzy when he changes position.  He has bad knees so he spends a lot of time recumbent.  No fevers chills.  No chest pain, no nausea or vomiting or palpitations     Physical Exam   Triage Vital Signs: ED Triage Vitals  Enc Vitals Group     BP 07/10/22 1605 (!) 110/59     Pulse Rate 07/10/22 1605 77     Resp 07/10/22 1605 15     Temp 07/10/22 1605 98.2 F (36.8 C)     Temp Source 07/10/22 1605 Oral     SpO2 07/10/22 1605 100 %     Weight 07/10/22 1617 79 kg (174 lb 3.2 oz)     Height 07/10/22 1617 1.575 m (5\' 2" )     Head Circumference --      Peak Flow --      Pain Score 07/10/22 1617 0     Pain Loc --      Pain Edu? --      Excl. in GC? --     Most recent vital signs: Vitals:   07/10/22 1605  BP: (!) 110/59  Pulse: 77  Resp: 15  Temp: 98.2 F (36.8 C)  SpO2: 100%     General: Awake, no distress.  CV:  Good peripheral perfusion.  Resp:  Normal effort.  Abd:  No distention.  Other:     ED Results / Procedures / Treatments   Labs (all labs ordered are listed, but only abnormal results are displayed) Labs Reviewed  CBC - Abnormal; Notable for the following components:      Result Value   RBC 3.65 (*)    MCV 107.4 (*)    MCH 36.4 (*)    All other components within normal limits  COMPREHENSIVE METABOLIC PANEL - Abnormal; Notable for the following components:   Potassium 3.4 (*)    BUN 53 (*)    Creatinine, Ser 1.57 (*)    Total Protein 6.2 (*)    GFR, Estimated 43 (*)    All other components within  normal limits  TROPONIN I (HIGH SENSITIVITY)     EKG  ED ECG REPORT I, Jene Every, the attending physician, personally viewed and interpreted this ECG.  Date: 07/10/2022  Rhythm: normal sinus rhythm QRS Axis: normal Intervals: Abnormal ST/T Wave abnormalities: normal Narrative Interpretation: no evidence of acute ischemia    RADIOLOGY     PROCEDURES:  Critical Care performed:   Procedures   MEDICATIONS ORDERED IN ED: Medications  0.9 %  sodium chloride infusion ( Intravenous New Bag/Given 07/10/22 1632)     IMPRESSION / MDM / ASSESSMENT AND PLAN / ED COURSE  I reviewed the triage vital signs and the nursing notes. Patient's presentation is most consistent with acute presentation with potential threat to life or bodily function.  Patient presents for syncopal episode as detailed above.  Differential includes postural hypotension, does not appear consistent with arrhythmia or ACS, no chest pain or  palpitations  Possible dehydration, patient clinically mildly dehydrated, he states decreased p.o. intake recently  Lab notable for elevated BUN/creatinine consistent with dehydration otherwise labs are unremarkable, patient is feeling much better after IV fluids, he is asymptomatic, appropriate for discharge at this time        FINAL CLINICAL IMPRESSION(S) / ED DIAGNOSES   Final diagnoses:  Near syncope  Dehydration     Rx / DC Orders   ED Discharge Orders     None        Note:  This document was prepared using Dragon voice recognition software and may include unintentional dictation errors.   Jene Every, MD 07/10/22 [phone]

## 2022-07-10 NOTE — ED Triage Notes (Signed)
Pt brought in by EMS for fall d/t syncopal episode. EMS states pt was found on the ground by his neighbor. Pt has no complaints of pain. Pt states he has been experiencing dizziness when standing for several months; states he has to be very careful and grab onto things when he gets up. Pt states he had an appt with a cardiologist last week but he missed it because he fell asleep.

## 2022-07-15 ENCOUNTER — Ambulatory Visit
Admission: RE | Admit: 2022-07-15 | Discharge: 2022-07-15 | Disposition: A | Discharge status: Home / Self Care | Payer: Medicare Other | Source: Ambulatory Visit | Attending: Family Medicine | Admitting: Family Medicine

## 2022-07-15 DIAGNOSIS — R7989 Other specified abnormal findings of blood chemistry: Secondary | ICD-10-CM | POA: Diagnosis not present

## 2022-07-18 ENCOUNTER — Other Ambulatory Visit: Payer: Self-pay | Admitting: Family Medicine

## 2022-07-18 NOTE — Progress Notes (Unsigned)
I,Vanessa  Vital,acting as a Neurosurgeon for [employer], MD.,have documented all relevant documentation on the behalf of [name], MD,as directed by  [name], MD while in the presence of [name], MD.  Established patient visit   Patient: Wesley Small   DOB: 03/03/1935   87 y.o. Male  MRN: [id number] Visit Date: 07/19/2022  Today's healthcare provider: [name], MD   Chief Complaint  Patient presents with   Hypertension   Subjective    Patient states he fell 6/30 that EMS had to help him. States to be very tired and weak recently and does not sleep well.   Hypertension, follow-up  BP Readings from Last 3 Encounters:  07/19/22 115/80  07/10/22 (!) 147/82  06/27/22 (!) 178/97   Wt Readings from Last 3 Encounters:  07/19/22 169 lb 11.2 oz (77 kg)  07/10/22 174 lb 3.2 oz (79 kg)  06/27/22 174 lb 3.2 oz (79 kg)     He was last seen for hypertension 3 weeks ago.  BP at that visit was 178/97. Management since that visit includes microzide 12.5 mg.  He reports good compliance with treatment. He is not having side effects. He is following a Regular diet. He is not exercising. He does not smoke.  Discussed the use of AI scribe software for clinical note transcription with the patient, who gave verbal consent to proceed.  History of Present Illness   The patient, with a history of hypertension and cardiac issues, presents with concerns about persistent lightheadedness and unsteadiness on his feet. He reports needing to be cautious when moving around and often feels the need to grab onto something for support. These symptoms have been particularly noticeable since the initiation of a diuretic medication. The patient also reports a recent fall at home, which resulted in a hospital visit.  The patient's blood pressure has been well-controlled recently, with readings as low as 115/80, which is a  significant improvement from previous readings in the 170s/90s. However, the patient and his significant other express concern that the diuretic medication may be contributing to the lightheadedness and unsteadiness. They suggest that the medication may be too strong and express a desire to discontinue it.  Anxiety The patient also reports a lack of energy and frequent feelings of anxiety, which he describes as being high-strung. He notes that he can become emotional and tearful, and his significant other reports that the patient can be quick to anger, particularly during discussions of politics. The patient acknowledges that he may benefit from an anti-anxiety medication to help manage these symptoms.  Fatigue  He reports feeling very tired and sleepy and often falling asleep during appointments which caused to miss his appt  The patient has a history of taking various supplements, including B12 and vitamin D, but has recently discontinued these in favor of prescribed medications. He has resumed taking vitamin D3 but has not restarted any other supplements. The patient also reports a diagnosis of fatty liver disease, which he is unsure of the implications.  The patient lives alone but has supportive neighbors who check in on him regularly. He expresses some concern about his ability to manage his health independently, particularly given his recent fall and ongoing unsteadiness. The patient and his significant other are considering the use of a lifeline or similar service for added security.     --   Medications: Outpatient Medications Prior to Visit  Medication Sig   atorvastatin (LIPITOR) 10 MG tablet Take 1  tablet (10 mg total) by mouth at bedtime.   traMADol (ULTRAM) 50 MG tablet TAKE ONE TABLET BY MOUTH TWICE A DAY AS NEEDED FOR PAIN   valsartan (DIOVAN) 160 MG tablet Take 160 mg by mouth daily.   [DISCONTINUED] hydrochlorothiazide (MICROZIDE) 12.5 MG capsule Take 1 capsule (12.5 mg total) by  mouth daily.   [DISCONTINUED] metoprolol succinate (TOPROL-XL) 25 MG 24 hr tablet Take 1.5 tablets (37.5 mg total) by mouth daily.   [DISCONTINUED] zolpidem (AMBIEN) 10 MG tablet TAKE ONE TABLET (10 MG TOTAL) BY MOUTH AT BEDTIME.   No facility-administered medications prior to visit.    Review of Systems     Objective    BP 115/80 (BP Location: Left Arm, Patient Position: Sitting, Cuff Size: Normal)   Pulse 96   Resp 14   Ht 5\' 2"  (1.575 m)   Wt 169 lb 11.2 oz (77 kg)   SpO2 99%   BMI 31.04 kg/m    Physical Exam Vitals reviewed.  Constitutional:      General: He is not in acute distress.    Appearance: Normal appearance. He is not ill-appearing, toxic-appearing or diaphoretic.  Eyes:     Conjunctiva/sclera: Conjunctivae normal.  Cardiovascular:     Rate and Rhythm: Normal rate and regular rhythm.     Pulses: Normal pulses.     Heart sounds: Normal heart sounds. No murmur heard.    No friction rub. No gallop.  Pulmonary:     Effort: Pulmonary effort is normal. No respiratory distress.     Breath sounds: Normal breath sounds. No stridor. No wheezing, rhonchi or rales.  Abdominal:     General: Bowel sounds are normal. There is no distension.     Palpations: Abdomen is soft.     Tenderness: There is no abdominal tenderness.  Musculoskeletal:     Right lower leg: Edema present.     Left lower leg: No edema.     Comments: Trace RLE   Skin:    Findings: No erythema or rash.  Neurological:     Mental Status: He is alert and oriented to person, place, and time.     Physical Exam   VITALS: BP- 115/80 CHEST: Lungs clear to auscultation without wheezing or crackles. CARDIOVASCULAR: Heart rate and rhythm regular without murmurs. EXTREMITIES: Trace right lower extremity edema.       No results found for any visits on 07/19/22.   Assessment & Plan     Problem List Items Addressed This Visit     Insomnia, persistent    Currently on Ambien 10mg , but considering age  and risk of falls, a lower dose is recommended. -chronic -has been on medication for 30 years per patient  -counseled on risk of increasing fall risk/dizziness, patient voiced understanding  -Reduce Ambien to 5mg  at bedtime.      Essential (primary) hypertension - Primary    Improved control with current regimen, but experiencing lightheadedness and unsteadiness, possibly related to diuretic use. -Chronic, at goal today in office  -Discontinue Hydrochlorothiazide 12.5mg  daily. -Continue Valsartan 160mg  daily and Metoprolol 25mg  daily. -Check blood pressure at home and aim for less than 150/90.      Relevant Medications   metoprolol succinate (TOPROL-XL) 25 MG 24 hr tablet   Chronic fatigue    -Order Vitamin D levels and repeat TSH and free T4 due to concerns for persistent fatigue. -Schedule follow-up visit in 3 weeks.          Anxiety  Reports of increased anxiety and emotional lability. -chronic  -Start Buspirone 5mg  twice daily as needed for anxiety symptoms.      Relevant Medications   busPIRone (BUSPAR) 5 MG tablet   Elevated serum creatinine     Recent labs showed elevated creatinine (1.4-1.5). -Order Comprehensive Metabolic Panel (CMP) and Complete Blood Count (CBC) to reassess kidney function and check for anemia.      Relevant Orders   Comprehensive metabolic panel   Other Visit Diagnoses     Other fatigue       Relevant Orders   CBC   Vitamin D (25 hydroxy)   TSH + free T4   Vitamin D deficiency       Relevant Orders   Vitamin D (25 hydroxy)       Return in about 3 weeks (around 08/09/2022) for Mood, HTN.       [name], MD  [facility] [phone] (phone) [phone] (fax)  [facility]

## 2022-07-19 ENCOUNTER — Ambulatory Visit (INDEPENDENT_AMBULATORY_CARE_PROVIDER_SITE_OTHER): Payer: Medicare Other | Admitting: Family Medicine

## 2022-07-19 ENCOUNTER — Encounter: Payer: Self-pay | Admitting: Family Medicine

## 2022-07-19 VITALS — BP 115/80 | HR 96 | Resp 14 | Ht 62.0 in | Wt 169.7 lb

## 2022-07-19 DIAGNOSIS — R7989 Other specified abnormal findings of blood chemistry: Secondary | ICD-10-CM

## 2022-07-19 DIAGNOSIS — I10 Essential (primary) hypertension: Secondary | ICD-10-CM | POA: Diagnosis not present

## 2022-07-19 DIAGNOSIS — G4700 Insomnia, unspecified: Secondary | ICD-10-CM

## 2022-07-19 DIAGNOSIS — R5383 Other fatigue: Secondary | ICD-10-CM | POA: Diagnosis not present

## 2022-07-19 DIAGNOSIS — E559 Vitamin D deficiency, unspecified: Secondary | ICD-10-CM

## 2022-07-19 DIAGNOSIS — F419 Anxiety disorder, unspecified: Secondary | ICD-10-CM

## 2022-07-19 DIAGNOSIS — R5382 Chronic fatigue, unspecified: Secondary | ICD-10-CM | POA: Diagnosis not present

## 2022-07-19 MED ORDER — METOPROLOL SUCCINATE ER 25 MG PO TB24: 25.0000 mg | ORAL_TABLET | Freq: Every day | ORAL | 1 refills | Status: DC

## 2022-07-19 MED ORDER — BUSPIRONE HCL 5 MG PO TABS
5.0000 mg | ORAL_TABLET | Freq: Two times a day (BID) | ORAL | 0 refills | Status: DC
Start: 2022-07-19 — End: 2022-08-09

## 2022-07-19 MED ORDER — ZOLPIDEM TARTRATE 5 MG PO TABS: 5.0000 mg | ORAL_TABLET | Freq: Every evening | ORAL | 1 refills | Status: DC | PRN

## 2022-07-19 NOTE — Assessment & Plan Note (Signed)
-  Order Vitamin D levels and repeat TSH and free T4 due to concerns for persistent fatigue. -Schedule follow-up visit in 3 weeks.

## 2022-07-19 NOTE — Assessment & Plan Note (Signed)
Reports of increased anxiety and emotional lability. -chronic  -Start Buspirone 5mg  twice daily as needed for anxiety symptoms.

## 2022-07-19 NOTE — Assessment & Plan Note (Signed)
Improved control with current regimen, but experiencing lightheadedness and unsteadiness, possibly related to diuretic use. -Chronic, at goal today in office  -Discontinue Hydrochlorothiazide 12.5mg  daily. -Continue Valsartan 160mg  daily and Metoprolol 25mg  daily. -Check blood pressure at home and aim for less than 150/90.

## 2022-07-19 NOTE — Assessment & Plan Note (Signed)
Currently on Ambien 10mg , but considering age and risk of falls, a lower dose is recommended. -chronic -has been on medication for 30 years per patient  -counseled on risk of increasing fall risk/dizziness, patient voiced understanding  -Reduce Ambien to 5mg  at bedtime.

## 2022-07-19 NOTE — Assessment & Plan Note (Signed)
Recent labs showed elevated creatinine (1.4-1.5). -Order Comprehensive Metabolic Panel (CMP) and Complete Blood Count (CBC) to reassess kidney function and check for anemia.

## 2022-07-20 ENCOUNTER — Other Ambulatory Visit: Payer: Self-pay | Admitting: Family Medicine

## 2022-07-20 DIAGNOSIS — R7989 Other specified abnormal findings of blood chemistry: Secondary | ICD-10-CM

## 2022-07-20 DIAGNOSIS — N179 Acute kidney failure, unspecified: Secondary | ICD-10-CM

## 2022-07-20 LAB — COMPREHENSIVE METABOLIC PANEL
ALT: 47 IU/L — ABNORMAL HIGH (ref 0–44)
AST: 34 IU/L (ref 0–40)
Albumin: 4.1 g/dL (ref 3.7–4.7)
Alkaline Phosphatase: 82 IU/L (ref 44–121)
BUN/Creatinine Ratio: 15 (ref 10–24)
BUN: 26 mg/dL (ref 8–27)
Bilirubin Total: 0.8 mg/dL (ref 0.0–1.2)
CO2: 27 mmol/L (ref 20–29)
Calcium: 9.4 mg/dL (ref 8.6–10.2)
Chloride: 95 mmol/L — ABNORMAL LOW (ref 96–106)
Creatinine, Ser: 1.68 mg/dL — ABNORMAL HIGH (ref 0.76–1.27)
Globulin, Total: 2.3 g/dL (ref 1.5–4.5)
Glucose: 153 mg/dL — ABNORMAL HIGH (ref 70–99)
Potassium: 4 mmol/L (ref 3.5–5.2)
Sodium: 141 mmol/L (ref 134–144)
Total Protein: 6.4 g/dL (ref 6.0–8.5)
eGFR: 40 mL/min/{1.73_m2} — ABNORMAL LOW (ref >59)

## 2022-07-20 LAB — CBC
Hematocrit: 38.3 % (ref 37.5–51.0)
Hemoglobin: 13.3 g/dL (ref 13.0–17.7)
MCH: 35.9 pg — ABNORMAL HIGH (ref 26.6–33.0)
MCHC: 34.7 g/dL (ref 31.5–35.7)
MCV: 104 fL — ABNORMAL HIGH (ref 79–97)
Platelets: 215 10*3/uL (ref 150–450)
RBC: 3.7 x10E6/uL — ABNORMAL LOW (ref 4.14–5.80)
RDW: 11.8 % (ref 11.6–15.4)
WBC: 7.7 10*3/uL (ref 3.4–10.8)

## 2022-07-20 LAB — TSH+FREE T4
Free T4: 1.59 ng/dL (ref 0.82–1.77)
TSH: 1.76 u[IU]/mL (ref 0.450–4.500)

## 2022-07-20 LAB — VITAMIN D 25 HYDROXY (VIT D DEFICIENCY, FRACTURES): Vit D, 25-Hydroxy: 36.5 ng/mL (ref 30.0–100.0)

## 2022-07-25 ENCOUNTER — Other Ambulatory Visit: Payer: Self-pay | Admitting: Family Medicine

## 2022-07-25 DIAGNOSIS — G4700 Insomnia, unspecified: Secondary | ICD-10-CM

## 2022-07-28 LAB — MICROALBUMIN / CREATININE URINE RATIO
Creatinine, Urine: 145.2 mg/dL
Microalb/Creat Ratio: 10 mg/g creat (ref 0–29)
Microalbumin, Urine: 14.5 ug/mL

## 2022-08-03 ENCOUNTER — Ambulatory Visit
Admission: RE | Admit: 2022-08-03 | Discharge: 2022-08-03 | Disposition: A | Discharge status: Home / Self Care | Payer: Medicare Other | Source: Ambulatory Visit | Attending: Family Medicine | Admitting: Family Medicine

## 2022-08-03 DIAGNOSIS — R7989 Other specified abnormal findings of blood chemistry: Secondary | ICD-10-CM | POA: Insufficient documentation

## 2022-08-03 DIAGNOSIS — N179 Acute kidney failure, unspecified: Secondary | ICD-10-CM | POA: Diagnosis not present

## 2022-08-03 DIAGNOSIS — N281 Cyst of kidney, acquired: Secondary | ICD-10-CM | POA: Diagnosis not present

## 2022-08-09 ENCOUNTER — Encounter: Payer: Self-pay | Admitting: Family Medicine

## 2022-08-09 ENCOUNTER — Ambulatory Visit (INDEPENDENT_AMBULATORY_CARE_PROVIDER_SITE_OTHER): Payer: Medicare Other | Admitting: Family Medicine

## 2022-08-09 VITALS — BP 126/76 | HR 84 | Temp 98.2°F | Resp 12 | Ht 64.0 in | Wt 172.3 lb

## 2022-08-09 DIAGNOSIS — F419 Anxiety disorder, unspecified: Secondary | ICD-10-CM | POA: Diagnosis not present

## 2022-08-09 DIAGNOSIS — N179 Acute kidney failure, unspecified: Secondary | ICD-10-CM | POA: Diagnosis not present

## 2022-08-09 DIAGNOSIS — R600 Localized edema: Secondary | ICD-10-CM | POA: Insufficient documentation

## 2022-08-09 DIAGNOSIS — R251 Tremor, unspecified: Secondary | ICD-10-CM

## 2022-08-09 DIAGNOSIS — F339 Major depressive disorder, recurrent, unspecified: Secondary | ICD-10-CM | POA: Diagnosis not present

## 2022-08-09 DIAGNOSIS — R2241 Localized swelling, mass and lump, right lower limb: Secondary | ICD-10-CM

## 2022-08-09 DIAGNOSIS — I10 Essential (primary) hypertension: Secondary | ICD-10-CM

## 2022-08-09 DIAGNOSIS — G4700 Insomnia, unspecified: Secondary | ICD-10-CM

## 2022-08-09 MED ORDER — ZOLPIDEM TARTRATE 5 MG PO TABS: 5.0000 mg | ORAL_TABLET | Freq: Every evening | ORAL | 2 refills | Status: DC | PRN

## 2022-08-09 MED ORDER — SERTRALINE HCL 50 MG PO TABS
25.0000 mg | ORAL_TABLET | Freq: Every day | ORAL | 3 refills | Status: DC
Start: 2022-08-09 — End: 2022-09-13

## 2022-08-09 NOTE — Assessment & Plan Note (Addendum)
Reports lack of interest in hobbies and feeling of sadness. Discontinued Buspar due to adverse effects.  -start  Sertraline 25mg  daily. -Follow up in 3 weeks to assess mood changes.

## 2022-08-09 NOTE — Patient Instructions (Addendum)
Please start 25mg  Zoloft to help with mood   A referral has been placed on your behalf for neurology for your dizziness and tremor. Our referral coordination team or the office you will be visiting will contact you within the next 2 weeks.  If you have not received a phone call within 10 business days please let us know so that we can check into this for you.   We will follow up with results of labs once they are available.   I have ordered an ultrasound for your right leg swelling

## 2022-08-09 NOTE — Progress Notes (Signed)
Established patient visit   Patient: Wesley Small   DOB: [birth year]   86 y.o. Male  MRN: [id number] Visit Date: 08/09/2022  Today's healthcare provider: [name], MD   Chief Complaint  Patient presents with   Medical Management of Chronic Issues   Subjective     HPI   Patient is here with his son that lives in [place]. HTN-D/C hydrochlorothiazide 12.5 mg. Continue Valsartan 160 mg and Metoprolol 25 mg daily. Patient to check BP at home. Aim for less than 150/90. BP 116/77 this morning. He reports readings stay around the same at home.   Anxiety-Start Buspirone 5 mg daily. He reports he stopped medication. Couldn't sleep and negative impact on mood. Patient reports he is more depressed than anxious. Last edited by Myles Lipps, CMA on 08/09/2022  2:35 PM.      Discussed the use of AI scribe software for clinical note transcription with the patient, who gave verbal consent to proceed.  History of Present Illness   The patient, with a history of hypertension and anxiety, presents for a follow-up visit. They report that their blood pressure has been well-controlled on valsartan and metoprolol, after discontinuing hydrochlorothiazide due to unspecified side effects. They also discontinued an anti-anxiety medication, which they felt was causing sleep disturbances and an overall feeling of unease.  The patient expresses a shift in their mental health, identifying more with feelings of depression rather than anxiety. They describe a loss of interest in hobbies, specifically amateur radio, which they have not engaged in since January. They also report a persistent hand tremor that comes and goes, often worsening after using hand tools. The tremor has been severe enough to affect their ability to write and sign their name.  In addition to these concerns, the patient has been experiencing unilateral leg swelling and pain, particularly in their right leg. They  describe the swelling as hard and painful, extending up into their thigh. They also report episodes of lightheadedness, particularly upon standing, which they have been managing by changing positions slowly and lowering their head.  The patient has been managing their sleep with Ambien, but reports running out of the medication over a week ago, which has negatively impacted their sleep quality. They express a need for sleep, stating that they are not functional without it.  Lastly, the patient mentions concerns about their pacemaker, wondering if it could be contributing to their symptoms. They are due for a check-up in August and express uncertainty about whether their symptoms could be related to the pacemaker's battery life. They note that they are not pacemaker-dependent and that its operation varies.        AKI  Creatinine was elevated  Lab Results  Component Value Date   CREATININE 1.68 (H) 07/19/2022   CREATININE 1.57 (H) 07/10/2022   CREATININE 1.47 (H) 06/27/2022     Medications: Outpatient Medications Prior to Visit  Medication Sig   metoprolol succinate (TOPROL-XL) 25 MG 24 hr tablet Take 1 tablet (25 mg total) by mouth daily.   traMADol (ULTRAM) 50 MG tablet TAKE ONE TABLET BY MOUTH TWICE A DAY AS NEEDED FOR PAIN   valsartan (DIOVAN) 160 MG tablet Take 160 mg by mouth daily.   [DISCONTINUED] zolpidem (AMBIEN) 5 MG tablet Take 1 tablet (5 mg total) by mouth at bedtime as needed for sleep.   atorvastatin (LIPITOR) 10 MG tablet Take 1 tablet (10 mg total) by mouth at bedtime. (Patient not taking: Reported on  08/09/2022)   [DISCONTINUED] busPIRone (BUSPAR) 5 MG tablet Take 1 tablet (5 mg total) by mouth 2 (two) times daily. (Patient not taking: Reported on 08/09/2022)   [DISCONTINUED] tiZANidine (ZANAFLEX) 4 MG tablet Take 4 mg by mouth every 6 (six) hours as needed for muscle spasms. (Patient not taking: Reported on 08/09/2022)   No facility-administered medications prior to  visit.    Review of Systems       Objective    BP 126/76 (BP Location: Left Arm, Patient Position: Sitting, Cuff Size: Large)   Pulse 84   Temp 98.2 F (36.8 C) (Temporal)   Resp 12   Ht 5\' 4"  (1.626 m)   Wt 172 lb 4.8 oz (78.2 kg)   SpO2 97%   BMI 29.58 kg/m      Physical Exam Vitals reviewed.  Constitutional:      General: He is not in acute distress.    Appearance: Normal appearance. He is not ill-appearing, toxic-appearing or diaphoretic.  Eyes:     Conjunctiva/sclera: Conjunctivae normal.  Cardiovascular:     Rate and Rhythm: Normal rate and regular rhythm.     Pulses: Normal pulses.     Heart sounds: Normal heart sounds. No murmur heard.    No friction rub. No gallop.  Pulmonary:     Effort: Pulmonary effort is normal. No respiratory distress.     Breath sounds: Normal breath sounds. No stridor. No wheezing, rhonchi or rales.  Abdominal:     General: Bowel sounds are normal. There is no distension.     Palpations: Abdomen is soft.     Tenderness: There is no abdominal tenderness.  Musculoskeletal:     Right lower leg: Edema present.     Left lower leg: No edema.     Comments: +3 pitting on RLE, right LE noticeably more edematous than left, with bilateral venous stasis changes, tender to palpation   Skin:    Findings: No erythema or rash.  Neurological:     Mental Status: He is alert and oriented to person, place, and time.  Psychiatric:        Attention and Perception: Attention normal. He is attentive.        Mood and Affect: Mood is depressed. Affect is blunt. Affect is not tearful.        Speech: Speech normal.        Behavior: Behavior normal. Behavior is not slowed or withdrawn. Behavior is cooperative.       No results found for any visits on 08/09/22.  Assessment & Plan     Problem List Items Addressed This Visit     Anxiety    Reports lack of interest in hobbies and feeling of sadness. Discontinued Buspar due to adverse effects.  Currently on Sertraline 100mg  daily. -Continue Sertraline 100mg  daily. -Follow up in 3 weeks to assess mood changes.      Relevant Medications   sertraline (ZOLOFT) 50 MG tablet   Depression, recurrent (HCC) - Primary    Reports lack of interest in hobbies and feeling of sadness. Discontinued Buspar due to adverse effects.  -start  Sertraline 25mg  daily. -Follow up in 3 weeks to assess mood changes.      Relevant Medications   sertraline (ZOLOFT) 50 MG tablet   Essential (primary) hypertension    Well controlled on Valsartan and Metoprolol. -Continue Valsartan 160 mg and Metoprolol 25mg  daily -Check kidney function today.      Insomnia, persistent    Reports difficulty staying  asleep. Ran out of Ambien. -chronic, worsened sleep without prescription  -Refill Ambien 5mg  at bedtime prescription.      Tremor    Reports intermittent hand tremor. Chronic, worsening and impacting quality of life  -Consider referral to neurologist for further evaluation.      Relevant Orders   Ambulatory referral to Neurology   Unilateral edema of lower extremity    Reports swelling and pain in one leg. No signs of fluid backing up in kidneys on recent ultrasound. -Acute  -Order ultrasound to rule out DVT  -Consider referral to nephrologist if kidney function continues to worse      Other Visit Diagnoses     Localized swelling of right lower extremity       Relevant Orders   US Venous Img Lower Unilateral Right (DVT)   AKI (acute kidney injury) (HCC)       Relevant Orders   Comprehensive metabolic panel            Return in about 3 weeks (around 08/30/2022) for Depression.      Total time spent on today's visit was 42 minutes, including both face-to-face time and r chart (labs and imaging), discussing labs and goals, discussing further work-up, ordering referral to specialists as well as imaging, answering both the patient's and his son's questions, and coordinating  care.    [name], MD  [facility] [phone] (phone) [phone] (fax)  [facility]

## 2022-08-09 NOTE — Assessment & Plan Note (Signed)
Reports difficulty staying asleep. Ran out of Ambien. -chronic, worsened sleep without prescription  -Refill Ambien 5mg  at bedtime prescription.

## 2022-08-09 NOTE — Assessment & Plan Note (Signed)
Reports lack of interest in hobbies and feeling of sadness. Discontinued Buspar due to adverse effects. Currently on Sertraline 100mg  daily. -Continue Sertraline 100mg  daily. -Follow up in 3 weeks to assess mood changes.

## 2022-08-09 NOTE — Assessment & Plan Note (Signed)
Well controlled on Valsartan and Metoprolol. -Continue Valsartan 160 mg and Metoprolol 25mg  daily -Check kidney function today.

## 2022-08-09 NOTE — Assessment & Plan Note (Signed)
Reports swelling and pain in one leg. No signs of fluid backing up in kidneys on recent ultrasound. -Acute  -Order ultrasound to rule out DVT  -Consider referral to nephrologist if kidney function continues to worse

## 2022-08-09 NOTE — Assessment & Plan Note (Signed)
Reports intermittent hand tremor. Chronic, worsening and impacting quality of life  -Consider referral to neurologist for further evaluation.

## 2022-08-15 ENCOUNTER — Other Ambulatory Visit: Payer: Self-pay | Admitting: Family Medicine

## 2022-08-16 NOTE — Telephone Encounter (Signed)
Requested medication (s) are due for refill today - yes  Requested medication (s) are on the active medication list -yes  Future visit scheduled -yes  Last refill: 07/18/22 #60  Notes to clinic: non delegated Rx  Requested Prescriptions  Pending Prescriptions Disp Refills   traMADol (ULTRAM) 50 MG tablet [Pharmacy Med Name: TRAMADOL HYDROCHLORIDE 50MG  TABLET] 60 tablet 0    Sig: TAKE ONE TABLET BY MOUTH TWICE A DAY AS NEEDED FOR PAIN     Not Delegated - Analgesics:  Opioid Agonists Failed - 08/15/2022  2:09 PM      Failed - This refill cannot be delegated      Failed - Urine Drug Screen completed in last 360 days      Passed - Valid encounter within last 3 months    Recent Outpatient Visits           1 week ago Depression, recurrent (HCC)   [facility] Simmons-Robinson, Tawanna Cooler, MD   4 weeks ago Essential (primary) hypertension   [facility] Simmons-Robinson, [place], MD   1 month ago Essential (primary) hypertension   [place] [facility] Simmons-Robinson, [place], MD   4 months ago Primary osteoarthritis of both knees   [place] [facility] Simmons-Robinson, [place], MD   9 months ago Sick sinus syndrome (HCC)   [facility] Simmons-Robinson, [place], MD       Future Appointments             In 2 weeks Simmons-Robinson, Tawanna Cooler, MD [facility]               Requested Prescriptions  Pending Prescriptions Disp Refills   traMADol (ULTRAM) 50 MG tablet [Pharmacy Med Name: TRAMADOL HYDROCHLORIDE 50MG  TABLET] 60 tablet 0    Sig: TAKE ONE TABLET BY MOUTH TWICE A DAY AS NEEDED FOR PAIN     Not Delegated - Analgesics:  Opioid Agonists Failed - 08/15/2022  2:09 PM      Failed - This refill cannot be delegated      Failed - Urine Drug Screen completed in last 360 days      Passed - Valid encounter within last 3 months     Recent Outpatient Visits           1 week ago Depression, recurrent (HCC)   [place] [facility] Simmons-Robinson, Tawanna Cooler, MD   4 weeks ago Essential (primary) hypertension   [place] [facility] Simmons-Robinson, [place], MD   1 month ago Essential (primary) hypertension   [place] [facility] Simmons-Robinson, [place], MD   4 months ago Primary osteoarthritis of both knees   [facility] Bloomer, [place], MD   9 months ago Sick sinus syndrome [facility])   [facility] Simmons-Robinson, Tawanna Cooler, MD       Future Appointments             In 2 weeks Simmons-Robinson, Tawanna Cooler, MD [facility]

## 2022-08-25 ENCOUNTER — Telehealth: Payer: Self-pay | Admitting: Family Medicine

## 2022-08-25 ENCOUNTER — Ambulatory Visit
Admission: RE | Admit: 2022-08-25 | Discharge: 2022-08-25 | Disposition: A | Discharge status: Home / Self Care | Payer: Medicare Other | Source: Ambulatory Visit | Attending: Family Medicine | Admitting: Family Medicine

## 2022-08-25 DIAGNOSIS — R2241 Localized swelling, mass and lump, right lower limb: Secondary | ICD-10-CM | POA: Diagnosis not present

## 2022-08-25 DIAGNOSIS — I82431 Acute embolism and thrombosis of right popliteal vein: Secondary | ICD-10-CM | POA: Insufficient documentation

## 2022-08-25 DIAGNOSIS — M7989 Other specified soft tissue disorders: Secondary | ICD-10-CM | POA: Diagnosis not present

## 2022-08-25 MED ORDER — APIXABAN (ELIQUIS) VTE STARTER PACK (10MG AND 5MG)
ORAL_TABLET | ORAL | 0 refills | Status: DC
Start: 2022-08-25 — End: 2022-10-05

## 2022-08-25 NOTE — Telephone Encounter (Signed)
Received report regarding positive results for DVT US for right LE swelling   Impression: subacute, extensive RIGHT lower extremity femoropopliteal DVT.  Wt 78.2kg 08/09/22 Creatinine 1.34, eGFR 52 08/09/22   Plan: start Eliquis 10mg  twice daily for 7 days followed by 5mg  twice daily for three months therapy for blood clot in lower extremity     Discussed results with patient via telephone and patient voices understanding of treatment plan and agrees to treatment with anticoagulation   Will follow up at upcoming PCP office visit on 08/30/22    [name], MD  [facility]

## 2022-08-26 ENCOUNTER — Telehealth: Payer: Self-pay | Admitting: Family Medicine

## 2022-08-26 DIAGNOSIS — I82431 Acute embolism and thrombosis of right popliteal vein: Secondary | ICD-10-CM

## 2022-08-26 NOTE — Telephone Encounter (Signed)
The patient called in stating even after insurance he would owe over $430 some odd dollars on the APIXABAN (ELIQUIS) VTE STARTER PACK (10MG  AND 5MG  and he cannot afford that. He is not sure what to do or if there is a better alternative that his insurance will cover. Please assist patient further    [facility] - [place], [place] - [address] Phone: [phone]  Fax: [phone]

## 2022-08-29 NOTE — Telephone Encounter (Signed)
Patient was given provider message. Reports he has an appointment tomorrow with Dr. Roxan Hockey.

## 2022-08-29 NOTE — Telephone Encounter (Signed)
Please advise patient to pick up Eliquis samples from [address] when he is available   I have contacted pharmacy team to assist with cost options for anticoagulation in the meantime. Please advise patient to await call for further recommendations  [name], MD  [facility]

## 2022-08-30 ENCOUNTER — Ambulatory Visit (INDEPENDENT_AMBULATORY_CARE_PROVIDER_SITE_OTHER): Payer: Medicare Other | Admitting: Family Medicine

## 2022-08-30 ENCOUNTER — Encounter: Payer: Self-pay | Admitting: Family Medicine

## 2022-08-30 VITALS — BP 133/74 | HR 96 | Temp 97.6°F | Resp 16 | Ht 64.0 in | Wt 166.5 lb

## 2022-08-30 DIAGNOSIS — I10 Essential (primary) hypertension: Secondary | ICD-10-CM | POA: Diagnosis not present

## 2022-08-30 DIAGNOSIS — R42 Dizziness and giddiness: Secondary | ICD-10-CM

## 2022-08-30 DIAGNOSIS — I82431 Acute embolism and thrombosis of right popliteal vein: Secondary | ICD-10-CM | POA: Diagnosis not present

## 2022-08-30 MED ORDER — VALSARTAN 160 MG PO TABS: 80.0000 mg | ORAL_TABLET | Freq: Every day | ORAL | Status: DC

## 2022-08-30 NOTE — Progress Notes (Signed)
Established patient visit   Patient: Wesley Small   DOB: 1935/06/05   87 y.o. Male  MRN: [id number] Visit Date: 08/30/2022  Today's healthcare provider: [name], MD   Chief Complaint  Patient presents with   Medical Management of Chronic Issues   Subjective     HPI   Patient reports reasonable BP readings with his watch.  Last edited by Myles Lipps, CMA on 08/30/2022  3:01 PM.       Discussed the use of AI scribe software for clinical note transcription with the patient, who gave verbal consent to proceed.  History of Present Illness   The patient, with a history of pacemaker implantation, presents with a recent diagnosis of deep vein thrombosis (DVT) in the right lower extremity. The patient reports a decrease in swelling in the affected leg, but notes the development of prominent anterior patellar veins. The patient also reports that the leg swells more with prolonged standing during the day.  The patient has been prescribed Eliquis for the DVT, but expressed concerns about the cost of the medication.  The patient also reports episodes of lightheadedness and imbalance, particularly upon standing. These episodes have led to a decrease in the patient's activity level due to fear of falling. The patient has a history of hand tremors, which have been intermittent and have affected the patient's ability to perform tasks such as writing. The patient has stopped taking all supplements and Lipitor in an attempt to identify a potential cause for these symptoms.  The patient has a pacemaker and has been experiencing some issues with the device. The patient has not seen a cardiologist since the pacemaker was implanted, but is considering making an appointment due to the upcoming need for potential replacement of the device.  The patient was recently hospitalized for dehydration, but reports improvement with increased fluid intake. The patient's  significant other expressed concern about the patient's lightheadedness and decreased activity level.         Medications: Outpatient Medications Prior to Visit  Medication Sig Note   APIXABAN (ELIQUIS) VTE STARTER PACK (10MG  AND 5MG ) Take as directed on package: start with two-5mg  tablets twice daily for 7 days. On day 8, switch to one-5mg  tablet twice daily.    metoprolol succinate (TOPROL-XL) 25 MG 24 hr tablet Take 1 tablet (25 mg total) by mouth daily.    sertraline (ZOLOFT) 50 MG tablet Take 0.5 tablets (25 mg total) by mouth daily.    traMADol (ULTRAM) 50 MG tablet TAKE ONE TABLET BY MOUTH TWICE A DAY AS NEEDED FOR PAIN    zolpidem (AMBIEN) 5 MG tablet Take 1 tablet (5 mg total) by mouth at bedtime as needed for sleep.    [DISCONTINUED] atorvastatin (LIPITOR) 10 MG tablet Take 1 tablet (10 mg total) by mouth at bedtime. 08/30/2022: 87 yo male   [DISCONTINUED] valsartan (DIOVAN) 160 MG tablet Take 160 mg by mouth daily.    No facility-administered medications prior to visit.    Review of Systems  Last lipids Lab Results  Component Value Date   CHOL 169 04/28/2021   HDL 46 04/28/2021   LDLCALC 81 04/28/2021   TRIG 257 (H) 04/28/2021   CHOLHDL 3.7 04/28/2021         Objective    BP 133/74 (BP Location: Left Arm, Patient Position: Sitting, Cuff Size: Normal)   Pulse 96   Temp 97.6 F (36.4 C) (Temporal)   Resp 16   Ht 5'  4" (1.626 m)   Wt 166 lb 8 oz (75.5 kg)   SpO2 94%   BMI 28.58 kg/m     Physical Exam  Physical Exam   VITALS: BP- 116/79 CHEST: Lungs clear to auscultation bilaterally. EXTREMITIES: Right lower extremity shows edema, not impeding movement. Anterior patellar veins prominent. No calf tenderness upon palpation.       No results found for any visits on 08/30/22.  Assessment & Plan     Problem List Items Addressed This Visit     Deep vein thrombosis (DVT) of popliteal vein of right lower extremity (HCC) - Primary    Right lower extremity  DVT with some residual edema and prominent anterior patellar veins. Discussed the benefits of Eliquis over warfarin and Xarelto (less frequent monitoring, lower risk of GI bleed). -acute -Start Eliquis 10mg  BID for 7 days, then 5mg  BID for the remainder of the 65-month treatment period. -Provided 2 months worth of Eliquis samples. -Referral to pharmacy for cost assistance with Eliquis.      Relevant Medications   valsartan (DIOVAN) 160 MG tablet   Dizziness    -Schedule appointment with neurology. -Patient to contact cardiology for pacemaker replacement scheduling.  -decreased valsartan to 80mg  once daily from 160mg  daily        Essential (primary) hypertension    Reports dizziness upon standing, possibly due to blood pressure medication. -Reduce Valsartan dose by half. -Consult with neurologist regarding dizziness and balance issues.      Relevant Medications   valsartan (DIOVAN) 160 MG tablet            Return in about 2 weeks (around 09/13/2022) for Dizziness, HTN.      [name], MD  [facility] [phone] (phone) [phone] (fax)  [facility]

## 2022-08-30 NOTE — Assessment & Plan Note (Signed)
Right lower extremity DVT with some residual edema and prominent anterior patellar veins. Discussed the benefits of Eliquis over warfarin and Xarelto (less frequent monitoring, lower risk of GI bleed). -acute -Start Eliquis 10mg  BID for 7 days, then 5mg  BID for the remainder of the 56-month treatment period. -Provided 2 months worth of Eliquis samples. -Referral to pharmacy for cost assistance with Eliquis.

## 2022-08-30 NOTE — Assessment & Plan Note (Addendum)
-  Schedule appointment with neurology. -Patient to contact cardiology for pacemaker replacement scheduling.  -decreased valsartan to 80mg  once daily from 160mg  daily

## 2022-08-30 NOTE — Patient Instructions (Addendum)
Neurology  [facility]  [address]  [place], [place] [address]  [phone] (Work)  -ask about dizziness and tremor/balance  Paraschos, Lyn Hollingshead, MD  [address]  [facility]  [place], [place] [address]  [phone] (Work)  -ask about dizziness    VISIT SUMMARY:  During your visit, we discussed your recent diagnosis of deep vein thrombosis (DVT), a condition where a blood clot forms in a vein deep in your body, usually in your leg. We also talked about your episodes of lightheadedness and imbalance, particularly upon standing, which could be due to a condition called orthostatic hypotension, where your blood pressure drops when you stand up. We also discussed your pacemaker and the need for a potential replacement.  YOUR PLAN:  -DEEP VEIN THROMBOSIS (DVT): We decided to start you on Eliquis, a medication that helps prevent blood clots. You will take 10mg  twice a day for 7 days, then 5mg  twice a day for the remainder of the 75-month treatment period. We also provided you with 2 months worth of Eliquis samples and referred you to the pharmacy for cost assistance with this medication.  -ORTHOSTATIC HYPOTENSION: We decided to reduce your Valsartan dose by half to help manage your dizziness and balance issues. We also plan to consult with a neurologist regarding these symptoms.  -CARDIAC PACEMAKER: We discussed the need for a potential pacemaker replacement. You should contact cardiology to schedule an appointment.  INSTRUCTIONS:  Please schedule an appointment with neurology and contact cardiology for pacemaker replacement scheduling. Continue to take your medications as prescribed and monitor your symptoms. If you notice any changes or if your symptoms worsen, please contact our office immediately.

## 2022-08-30 NOTE — Assessment & Plan Note (Signed)
Reports dizziness upon standing, possibly due to blood pressure medication. -Reduce Valsartan dose by half. -Consult with neurologist regarding dizziness and balance issues.

## 2022-09-05 ENCOUNTER — Other Ambulatory Visit: Payer: Medicare Other | Admitting: Pharmacist

## 2022-09-05 NOTE — Progress Notes (Signed)
   09/05/2022 Name: Wesley Small MRN: [id number] DOB: 12-21-1935   Wesley Small is a 87 y.o. year old male who presented for a telephone visit.   They were referred to the pharmacist by their PCP for assistance in managing medication access.    Subjective:  Care Team: Primary Care Provider: [name], MD  Medication Access/Adherence  Current Pharmacy:  [facility] - [place], [place] - [address] [place] [address] Phone: [phone] Fax: [phone]   Patient reports affordability concerns with their medications: Yes - eliquis Patient reports access/transportation concerns to their pharmacy: No  Patient reports adherence concerns with their medications:  No     Medication Access  Current adherence strategy: patient was provided a sample pack for apixaban 10mg  BID for 7 days, then 5mg  BID thereafter, should cover ~2 months but needing a cost strategy to obtain medication to complete a 3 month treatment course for DVT  Patient reports Good adherence to medications  Patient reports the following barriers to adherence: cost  Recent fill dates:    Objective:  Lab Results  Component Value Date   HGBA1C 5.4 06/27/2022    Lab Results  Component Value Date   CREATININE 1.34 (H) 08/09/2022   BUN 16 08/09/2022   NA 142 08/09/2022   K 4.5 08/09/2022   CL 101 08/09/2022   CO2 22 08/09/2022    Lab Results  Component Value Date   CHOL 169 04/28/2021   HDL 46 04/28/2021   LDLCALC 81 04/28/2021   TRIG 257 (H) 04/28/2021   CHOLHDL 3.7 04/28/2021    Medications Reviewed Today   Medications were not reviewed in this encounter      Assessment/Plan:   Medication Access: - Obtained 30 day free trial card for eliquis, should be sufficient to cover treatment duration of DVT - Collaborated with patient's pharmacy for 30 day free trial card applied and activated, confirmed a zero dollar copay for apixaban  5mg  BID qty #30 - Contacted patient to provide update, he is aware of dosing, and will pick up this prescription at his pharmacy when nearing the end of his sample.    Follow Up Plan: referral complete, instructed patient to keep his follow up with PCP, and if any ongoing or future needs for [facility] pharmacy team, to request via PCP. Patient expressed gratitude and verbalized understanding of the plan.  Lynnda Shields, PharmD, BCPS Clinical Pharmacist [facility]

## 2022-09-05 NOTE — Patient Instructions (Signed)
Wesley Small,   It was a pleasure speaking with you today!   As discussed, pick up the eliquis prescription at your pharmacy when you are nearing the end of your sample pack. The prescription is no cost.  Keep your follow up with Dr. Neita Garnet, and we are happy to help you if you need a pharmacist or medication help in the future!!  Take care, [name], PharmD, BCPS Clinical Pharmacist [facility]

## 2022-09-07 DIAGNOSIS — I495 Sick sinus syndrome: Secondary | ICD-10-CM | POA: Diagnosis not present

## 2022-09-13 ENCOUNTER — Encounter: Payer: Self-pay | Admitting: Family Medicine

## 2022-09-13 ENCOUNTER — Ambulatory Visit (INDEPENDENT_AMBULATORY_CARE_PROVIDER_SITE_OTHER): Payer: Medicare Other | Admitting: Family Medicine

## 2022-09-13 VITALS — BP 141/86 | HR 68 | Ht 68.0 in | Wt 163.7 lb

## 2022-09-13 DIAGNOSIS — I10 Essential (primary) hypertension: Secondary | ICD-10-CM | POA: Diagnosis not present

## 2022-09-13 DIAGNOSIS — Z23 Encounter for immunization: Secondary | ICD-10-CM

## 2022-09-13 DIAGNOSIS — I82431 Acute embolism and thrombosis of right popliteal vein: Secondary | ICD-10-CM | POA: Diagnosis not present

## 2022-09-13 DIAGNOSIS — M170 Bilateral primary osteoarthritis of knee: Secondary | ICD-10-CM

## 2022-09-13 DIAGNOSIS — F339 Major depressive disorder, recurrent, unspecified: Secondary | ICD-10-CM | POA: Diagnosis not present

## 2022-09-13 MED ORDER — SERTRALINE HCL 50 MG PO TABS: 50.0000 mg | ORAL_TABLET | Freq: Every day | ORAL | 3 refills | Status: DC

## 2022-09-13 NOTE — Assessment & Plan Note (Signed)
On Sertraline 25mg  daily. Reports no significant change in mood. Unclear on expected effects of medication. -chronic,improved minimally  -Increase Sertraline to 50mg  daily.

## 2022-09-13 NOTE — Progress Notes (Signed)
Established patient visit   Patient: Wesley Small   DOB: [birth year]   87 y.o. Male  MRN: [id number] Visit Date: 09/13/2022  Today's healthcare provider: [name], MD   Chief Complaint  Patient presents with   Medical Management of Chronic Issues   Subjective       Discussed the use of AI scribe software for clinical note transcription with the patient, who gave verbal consent to proceed.  History of Present Illness   Wesley Small, a patient with a history of cardiovascular disease and arthritis, presents with ongoing fatigue and occasional dizziness upon standing. He reports no significant changes in his overall health status. The patient has noticed a slight reduction in leg swelling, which was previously a concern. He has been compliant with his prescribed medications, including a blood thinner for a recent clot, and has noticed a change in urine color during the initial high-dose phase of the blood thinner, which has since returned to normal.  The patient has been on 25mg  of Sertraline (Zoloft) for mood regulation, but reports no significant changes in mood or behavior. He expresses uncertainty about the expected effects of the medication. The patient does not report feelings of sadness or loss of interest in activities, suggesting that depressive symptoms may not be prominent.  The patient's mobility is limited due to arthritis in the knees, which is managed with periodic injections and Tramadol for pain. He reports that the injections significantly improve his mobility. The patient also has a pacemaker, which is monitored regularly. He has upcoming appointments with a cardiologist and a neurologist.  The patient lives alone and maintains a relatively sedentary lifestyle due to mobility limitations. He reports regular interactions with neighbors and family via phone, suggesting a supportive social network. The patient's blood pressure is being monitored and  is currently within acceptable ranges.         Medications: Outpatient Medications Prior to Visit  Medication Sig   APIXABAN (ELIQUIS) VTE STARTER PACK (10MG  AND 5MG ) Take as directed on package: start with two-5mg  tablets twice daily for 7 days. On day 8, switch to one-5mg  tablet twice daily.   metoprolol succinate (TOPROL-XL) 25 MG 24 hr tablet Take 1 tablet (25 mg total) by mouth daily.   traMADol (ULTRAM) 50 MG tablet TAKE ONE TABLET BY MOUTH TWICE A DAY AS NEEDED FOR PAIN   valsartan (DIOVAN) 160 MG tablet Take 0.5 tablets (80 mg total) by mouth daily.   zolpidem (AMBIEN) 5 MG tablet Take 1 tablet (5 mg total) by mouth at bedtime as needed for sleep.   [DISCONTINUED] sertraline (ZOLOFT) 50 MG tablet Take 0.5 tablets (25 mg total) by mouth daily.   No facility-administered medications prior to visit.    Review of Systems      Objective    BP (!) 141/86 (BP Location: Left Arm, Patient Position: Sitting, Cuff Size: Normal)   Pulse 68   Ht 5\' 8"  (1.727 m)   Wt 163 lb 11.2 oz (74.3 kg)   SpO2 95%   BMI 24.89 kg/m     Physical Exam Vitals reviewed.  Constitutional:      General: He is not in acute distress.    Appearance: Normal appearance. He is not ill-appearing, toxic-appearing or diaphoretic.  Eyes:     Conjunctiva/sclera: Conjunctivae normal.  Cardiovascular:     Rate and Rhythm: Normal rate and regular rhythm.     Pulses: Normal pulses.     Heart sounds: Normal heart  sounds. No murmur heard.    No friction rub. No gallop.  Pulmonary:     Effort: Pulmonary effort is normal. No respiratory distress.     Breath sounds: Normal breath sounds. No stridor. No wheezing, rhonchi or rales.  Abdominal:     General: Bowel sounds are normal. There is no distension.     Palpations: Abdomen is soft.     Tenderness: There is no abdominal tenderness.  Musculoskeletal:     Right lower leg: No edema.     Left lower leg: No edema.  Skin:    Findings: No erythema or rash.   Neurological:     Mental Status: He is alert and oriented to person, place, and time.       No results found for any visits on 09/13/22.  Assessment & Plan     Problem List Items Addressed This Visit     Deep vein thrombosis (DVT) of popliteal vein of right lower extremity (HCC)    On Eliquis 5mg  twice daily. Reports swelling in leg is decreasing. Noted change in urine color when initially started on higher dose, but has since resolved. -new diagnosis, improved leg edema  -Continue Eliquis 5mg  twice daily.      Depression, recurrent (HCC)    On Sertraline 25mg  daily. Reports no significant change in mood. Unclear on expected effects of medication. -chronic,improved minimally  -Increase Sertraline to 50mg  daily.      Relevant Medications   sertraline (ZOLOFT) 50 MG tablet   Essential (primary) hypertension    On Valsartan 80mg  daily. Blood pressure within acceptable range. -Continue Valsartan 80mg  daily and metoprolol 25mg  daily      Other Visit Diagnoses     Immunization due    -  Primary   Relevant Orders   Flu Vaccine Trivalent High Dose (Fluad) (Completed)   Influenza vaccine needed       Relevant Orders   Flu Vaccine Trivalent High Dose (Fluad) (Completed)                 Return in about 2 months (around 11/25/2022) for Dizziness.         [name], MD  [facility] [phone] (phone) [phone] (fax)  [facility]

## 2022-09-13 NOTE — Assessment & Plan Note (Signed)
On Valsartan 80mg  daily. Blood pressure within acceptable range. -Continue Valsartan 80mg  daily and metoprolol 25mg  daily

## 2022-09-13 NOTE — Assessment & Plan Note (Signed)
Reports knee pain. Due for intra-articular injections next month. -Continue current pain management plan.

## 2022-09-13 NOTE — Patient Instructions (Signed)
VISIT SUMMARY:  During your visit, we discussed your ongoing fatigue and occasional dizziness, as well as your cardiovascular disease, arthritis, and mood regulation. You've been compliant with your medications and have noticed a reduction in leg swelling and a return to normal urine color after a recent change due to medication. Your mobility is limited due to arthritis, but injections have been helping. You have a supportive social network and your blood pressure is within acceptable ranges.  YOUR PLAN:  -DEPRESSION: You're currently on Sertraline for mood regulation, but you've reported no significant changes in mood. Depression is a mood disorder that causes a persistent feeling of sadness and loss of interest. We're going to increase your Sertraline dosage to 50mg  daily to see if this helps improve your mood.  -DEEP VEIN THROMBOSIS: You've been taking Eliquis for a blood clot in your leg. Deep Vein Thrombosis (DVT) is a blood clot that forms in a vein deep in your body, most often in your leg. You've noticed a decrease in swelling and your urine color has returned to normal. We'll continue with the same dosage of Eliquis.  -HYPERTENSION: You're on Valsartan for high blood pressure. Hypertension is a condition where the force of the blood against the artery walls is too high. Your blood pressure is currently within acceptable ranges, so we'll continue with the same dosage of Valsartan.  -OSTEOARTHRITIS: You have arthritis in your knees, which is causing pain and limiting your mobility. Osteoarthritis is the most common form of arthritis, and it occurs when the protective cartilage that cushions the ends of your bones wears down over time. We'll continue with your current pain management plan, which includes injections and Tramadol.  INSTRUCTIONS:  You have follow-up appointments with Cardiology and Neurology in October. We'll also follow up in November to see how you're doing with the increased  dosage of Sertraline and to continue monitoring your other conditions.

## 2022-09-13 NOTE — Assessment & Plan Note (Signed)
On Eliquis 5mg  twice daily. Reports swelling in leg is decreasing. Noted change in urine color when initially started on higher dose, but has since resolved. -new diagnosis, improved leg edema  -Continue Eliquis 5mg  twice daily.

## 2022-09-14 ENCOUNTER — Other Ambulatory Visit: Payer: Self-pay | Admitting: Family Medicine

## 2022-10-03 ENCOUNTER — Other Ambulatory Visit: Payer: Self-pay | Admitting: Family Medicine

## 2022-10-11 DIAGNOSIS — I82531 Chronic embolism and thrombosis of right popliteal vein: Secondary | ICD-10-CM | POA: Diagnosis not present

## 2022-10-11 DIAGNOSIS — I10 Essential (primary) hypertension: Secondary | ICD-10-CM | POA: Diagnosis not present

## 2022-10-11 DIAGNOSIS — I495 Sick sinus syndrome: Secondary | ICD-10-CM | POA: Diagnosis not present

## 2022-10-11 DIAGNOSIS — Z950 Presence of cardiac pacemaker: Secondary | ICD-10-CM | POA: Diagnosis not present

## 2022-10-11 DIAGNOSIS — R002 Palpitations: Secondary | ICD-10-CM | POA: Diagnosis not present

## 2022-10-11 DIAGNOSIS — E782 Mixed hyperlipidemia: Secondary | ICD-10-CM | POA: Diagnosis not present

## 2022-10-12 ENCOUNTER — Other Ambulatory Visit: Payer: Self-pay | Admitting: Family Medicine

## 2022-10-12 DIAGNOSIS — R202 Paresthesia of skin: Secondary | ICD-10-CM | POA: Diagnosis not present

## 2022-10-12 DIAGNOSIS — R251 Tremor, unspecified: Secondary | ICD-10-CM | POA: Diagnosis not present

## 2022-10-12 DIAGNOSIS — R200 Anesthesia of skin: Secondary | ICD-10-CM | POA: Diagnosis not present

## 2022-10-12 DIAGNOSIS — Z7689 Persons encountering health services in other specified circumstances: Secondary | ICD-10-CM | POA: Diagnosis not present

## 2022-10-12 NOTE — Telephone Encounter (Signed)
Requested medication (s) are due for refill today: due 10/15/22  Requested medication (s) are on the active medication list: yes    Last refill: 09/14/22  #60  0 refills  Future visit scheduled yes 11/25/22  Notes to clinic: Not delegated, please review. Thank you.  Requested Prescriptions  Pending Prescriptions Disp Refills   traMADol (ULTRAM) 50 MG tablet [Pharmacy Med Name: TRAMADOL HYDROCHLORIDE 50MG  TABLET] 60 tablet 0    Sig: TAKE ONE TABLET BY MOUTH TWICE A DAY AS NEEDED FOR PAIN     Not Delegated - Analgesics:  Opioid Agonists Failed - 10/12/2022  8:32 AM      Failed - This refill cannot be delegated      Failed - Urine Drug Screen completed in last 360 days      Passed - Valid encounter within last 3 months    Recent Outpatient Visits           4 weeks ago Immunization due   [place] [facility] Simmons-Robinson, [place], MD   1 month ago Deep vein thrombosis (DVT) of popliteal vein of right lower extremity, unspecified chronicity (HCC)   [place] [facility] Simmons-Robinson, [place], MD   2 months ago Depression, recurrent (HCC)   [place] [facility] Simmons-Robinson, [place], MD   2 months ago Essential (primary) hypertension   [place] [facility] Simmons-Robinson, [place], MD   3 months ago Essential (primary) hypertension   [facility] Simmons-Robinson, Financial controller, MD       Future Appointments             In 1 month Simmons-Robinson, Tawanna Cooler, MD [facility]

## 2022-10-24 ENCOUNTER — Other Ambulatory Visit: Payer: Self-pay | Admitting: Family Medicine

## 2022-10-25 NOTE — Telephone Encounter (Signed)
Requested medication (s) are due for refill today - yes  Requested medication (s) are on the active medication list -yes  Future visit scheduled -yes  Last refill: 08/09/22 #30 2RF  Notes to clinic: non delegated Rx  Requested Prescriptions  Pending Prescriptions Disp Refills   zolpidem (AMBIEN) 5 MG tablet [Pharmacy Med Name: ZOLPIDEM TARTRATE 5MG  TABLET] 30 tablet 2    Sig: TAKE ONE TABLET (5 MG TOTAL) BY MOUTH AT BEDTIME AS NEEDED FOR SLEEP.     Not Delegated - Psychiatry:  Anxiolytics/Hypnotics Failed - 10/24/2022  1:22 PM      Failed - This refill cannot be delegated      Failed - Urine Drug Screen completed in last 360 days      Passed - Valid encounter within last 6 months    Recent Outpatient Visits           1 month ago Immunization due   [place] [facility] Simmons-Robinson, [place], MD   1 month ago Deep vein thrombosis (DVT) of popliteal vein of right lower extremity, unspecified chronicity (HCC)   [place] [facility] Simmons-Robinson, [place], MD   2 months ago Depression, recurrent (HCC)   [place] [facility] Simmons-Robinson, [place], MD   3 months ago Essential (primary) hypertension   [facility] Simmons-Robinson, [place], MD   4 months ago Essential (primary) hypertension   [place] [facility] Simmons-Robinson, Financial controller, MD       Future Appointments             In 1 month Simmons-Robinson, Tawanna Cooler, MD [facility]               Requested Prescriptions  Pending Prescriptions Disp Refills   zolpidem (AMBIEN) 5 MG tablet [Pharmacy Med Name: ZOLPIDEM TARTRATE 5MG  TABLET] 30 tablet 2    Sig: TAKE ONE TABLET (5 MG TOTAL) BY MOUTH AT BEDTIME AS NEEDED FOR SLEEP.     Not Delegated - Psychiatry:  Anxiolytics/Hypnotics Failed - 10/24/2022  1:22 PM      Failed - This refill cannot be delegated      Failed - Urine Drug  Screen completed in last 360 days      Passed - Valid encounter within last 6 months    Recent Outpatient Visits           1 month ago Immunization due   [place] [facility] Simmons-Robinson, [place], MD   1 month ago Deep vein thrombosis (DVT) of popliteal vein of right lower extremity, unspecified chronicity (HCC)   [place] [facility] Simmons-Robinson, [place], MD   2 months ago Depression, recurrent (HCC)   [place] [facility] Simmons-Robinson, [place], MD   3 months ago Essential (primary) hypertension   [place] [facility] Simmons-Robinson, [place], MD   4 months ago Essential (primary) hypertension   [facility] Simmons-Robinson, Financial controller, MD       Future Appointments             In 1 month Simmons-Robinson, Tawanna Cooler, MD [facility], [facility]

## 2022-11-09 ENCOUNTER — Other Ambulatory Visit: Payer: Self-pay | Admitting: Family Medicine

## 2022-11-10 NOTE — Telephone Encounter (Signed)
Requested medications are due for refill today.  yes  Requested medications are on the active medications list.  yes  Last refill. 10/12/2022 #60 0 rf  Future visit scheduled.   no  Notes to clinic.  Refill not delegated.    Requested Prescriptions  Pending Prescriptions Disp Refills   traMADol (ULTRAM) 50 MG tablet [Pharmacy Med Name: TRAMADOL HYDROCHLORIDE 50MG  TABLET] 60 tablet 0    Sig: TAKE ONE TABLET BY MOUTH TWICE A DAY AS NEEDED FOR PAIN     Not Delegated - Analgesics:  Opioid Agonists Failed - 11/09/2022 11:22 AM      Failed - This refill cannot be delegated      Failed - Urine Drug Screen completed in last 360 days      Passed - Valid encounter within last 3 months    Recent Outpatient Visits           1 month ago Immunization due   [place] [facility] Simmons-Robinson, [place], MD   2 months ago Deep vein thrombosis (DVT) of popliteal vein of right lower extremity, unspecified chronicity (HCC)   [place] [facility] Simmons-Robinson, [place], MD   3 months ago Depression, recurrent (HCC)   [place] [facility] Simmons-Robinson, [place], MD   3 months ago Essential (primary) hypertension   [place] [facility] Simmons-Robinson, [place], MD   4 months ago Essential (primary) hypertension   [place] [facility] Simmons-Robinson, Tawanna Cooler, MD       Future Appointments             In 2 weeks Simmons-Robinson, Tawanna Cooler, MD [facility]

## 2022-11-25 ENCOUNTER — Ambulatory Visit: Payer: Medicare Other | Admitting: Family Medicine

## 2022-11-25 NOTE — Progress Notes (Deleted)
      Established patient visit   Patient: Wesley Small   DOB: 29-Jun-1935   87 y.o. Male  MRN: [id number] Visit Date: 11/25/2022  Today's healthcare provider: [name], MD   No chief complaint on file.  Subjective       Discussed the use of AI scribe software for clinical note transcription with the patient, who gave verbal consent to proceed.  History of Present Illness             Past Medical History:  Diagnosis Date   Arthritis    Dysrhythmia    RBBB   GERD (gastroesophageal reflux disease)    History of blood transfusion    History of diverticulitis    History of hiatal hernia    HOH (hard of hearing)    Hypercholesterolemia    Hypertension    Presence of permanent cardiac pacemaker    Sleep apnea     Medications: Outpatient Medications Prior to Visit  Medication Sig   ELIQUIS 5 MG TABS tablet TAKE ONE TABLET BY MOUTH TWO TMES DAILY   metoprolol succinate (TOPROL-XL) 25 MG 24 hr tablet Take 1 tablet (25 mg total) by mouth daily.   sertraline (ZOLOFT) 50 MG tablet Take 1 tablet (50 mg total) by mouth daily.   [START ON 01/05/2023] traMADol (ULTRAM) 50 MG tablet TAKE ONE TABLET BY MOUTH TWICE A DAY AS NEEDED FOR PAIN   [START ON 12/09/2022] traMADol (ULTRAM) 50 MG tablet TAKE ONE TABLET BY MOUTH TWICE A DAY AS NEEDED FOR PAIN   traMADol (ULTRAM) 50 MG tablet TAKE ONE TABLET BY MOUTH TWICE A DAY AS NEEDED FOR PAIN   valsartan (DIOVAN) 160 MG tablet Take 0.5 tablets (80 mg total) by mouth daily.   zolpidem (AMBIEN) 5 MG tablet TAKE ONE TABLET (5 MG TOTAL) BY MOUTH AT BEDTIME AS NEEDED FOR SLEEP.   No facility-administered medications prior to visit.    Review of Systems  {Insert previous labs (optional):23779} {See past labs  Heme  Chem  Endocrine  Serology  Results Review (optional):1}   Objective    There were no vitals taken for this visit. {Insert last BP/Wt (optional):23777}{See vitals history  (optional):1}      Physical Exam  ***  No results found for any visits on 11/25/22.  Assessment & Plan     Problem List Items Addressed This Visit   None   Assessment and Plan              No follow-ups on file.         [name], MD  [facility] [phone] (phone) [phone] (fax)  [facility]

## 2022-11-30 NOTE — Progress Notes (Addendum)
Established patient visit   Patient: Wesley Small   DOB: 19-Oct-1935   87 y.o. Male  MRN: [id number] Visit Date: 12/01/2022  Today's healthcare provider: [name], MD   Chief Complaint  Patient presents with  . Anxiety   Subjective       Discussed the use of AI scribe software for clinical note transcription with the patient, who gave verbal consent to proceed.  History of Present Illness   Wesley Small, an 87 year old patient with a history of depression managed on Zoloft 50mg  daily, presents with a complex constellation of symptoms that have been progressively worsening over several weeks. The patient reports significant difficulty walking due to lightheadedness and nausea, which intensify upon standing and are somewhat alleviated by lowering the head or sitting down. This has resulted in multiple falls, with some minor injuries but no loss of consciousness or head trauma.  The patient also reports a significant decrease in appetite, leading to a predominantly liquid diet and an 8-pound weight loss. The patient has been consuming Equate, a liquid nutritional supplement, but has little interest in solid foods. This change in diet has been accompanied by persistent, explosive diarrhea for the past 2-3 weeks, which has recently improved slightly. The patient denies recent antibiotic use.  In addition to these symptoms, the patient has been experiencing issues with neuropathy in the feet and a tremor. The patient had been prescribed gabapentin by a neurologist for these symptoms but discontinued the medication due to concerns about side effects, including lightheadedness and dizziness. However, the patient reports that these symptoms persisted even after stopping the gabapentin.  The patient also reports difficulty sleeping and has been on Ambien 5mg  at night for several years. The patient expresses dissatisfaction with the current dose, stating that it is not  effective for inducing or maintaining sleep. The patient also takes tramadol 50mg  twice daily for chronic back pain.  The patient has a history of a blood clot in the leg, which is currently managed with Eliquis. The patient's right leg remains swollen, but the swelling has decreased somewhat. The patient also has a pacemaker, which was recently checked and is reportedly functioning well. The patient anticipates needing a battery replacement in the coming year.  The patient's symptoms have significantly impacted his daily life, to the point where he requires assistance with mobility and has difficulty performing routine tasks. The patient's symptoms do not appear to correlate with the intake of any specific medications. The patient's blood pressure has been noted to be low at home, and the patient has been taking valsartan 80mg  intermittently, approximately three times a week, due to concerns about hypotension.         Past Medical History:  Diagnosis Date  . Arthritis   . Dysrhythmia    RBBB  . GERD (gastroesophageal reflux disease)   . History of blood transfusion   . History of diverticulitis   . History of hiatal hernia   . HOH (hard of hearing)   . Hypercholesterolemia   . Hypertension   . Presence of permanent cardiac pacemaker   . Sleep apnea     Medications: Outpatient Medications Prior to Visit  Medication Sig  . ELIQUIS 5 MG TABS tablet TAKE ONE TABLET BY MOUTH TWO TMES DAILY  . metoprolol succinate (TOPROL-XL) 25 MG 24 hr tablet Take 1 tablet (25 mg total) by mouth daily.  . sertraline (ZOLOFT) 50 MG tablet Take 1 tablet (50 mg total) by mouth daily.  . [  START ON 01/05/2023] traMADol (ULTRAM) 50 MG tablet TAKE ONE TABLET BY MOUTH TWICE A DAY AS NEEDED FOR PAIN  . [START ON 12/09/2022] traMADol (ULTRAM) 50 MG tablet TAKE ONE TABLET BY MOUTH TWICE A DAY AS NEEDED FOR PAIN  . traMADol (ULTRAM) 50 MG tablet TAKE ONE TABLET BY MOUTH TWICE A DAY AS NEEDED FOR PAIN  . valsartan  (DIOVAN) 160 MG tablet Take 0.5 tablets (80 mg total) by mouth daily.  Marland Kitchen zolpidem (AMBIEN) 5 MG tablet TAKE ONE TABLET (5 MG TOTAL) BY MOUTH AT BEDTIME AS NEEDED FOR SLEEP.   No facility-administered medications prior to visit.    Review of Systems  Last CBC Lab Results  Component Value Date   WBC 13.5 (H) 12/01/2022   HGB 15.2 12/01/2022   HCT 43.1 12/01/2022   MCV 101 (H) 12/01/2022   MCH 35.8 (H) 12/01/2022   RDW 12.5 12/01/2022   PLT 314 12/01/2022   Last metabolic panel Lab Results  Component Value Date   GLUCOSE 151 (H) 12/01/2022   NA 143 12/01/2022   K 3.0 (L) 12/01/2022   CL 99 12/01/2022   CO2 23 12/01/2022   BUN 18 12/01/2022   CREATININE 1.63 (H) 12/01/2022   EGFR 41 (L) 12/01/2022   CALCIUM 9.0 12/01/2022   PROT 6.2 12/01/2022   ALBUMIN 4.0 12/01/2022   LABGLOB 2.2 12/01/2022   AGRATIO 2.1 10/28/2021   BILITOT 1.5 (H) 12/01/2022   ALKPHOS 167 (H) 12/01/2022   AST 46 (H) 12/01/2022   ALT 31 12/01/2022   ANIONGAP 14 07/10/2022   Last lipids Lab Results  Component Value Date   CHOL 169 04/28/2021   HDL 46 04/28/2021   LDLCALC 81 04/28/2021   TRIG 257 (H) 04/28/2021   CHOLHDL 3.7 04/28/2021   Last hemoglobin A1c Lab Results  Component Value Date   HGBA1C 5.5 12/01/2022   Last thyroid functions Lab Results  Component Value Date   TSH 2.340 12/01/2022        Objective    BP (!) 144/96   Pulse 100   Resp 18   Ht 5\' 8"  (1.727 m)   Wt 155 lb 12.8 oz (70.7 kg)   SpO2 97%   BMI 23.69 kg/m  BP Readings from Last 3 Encounters:  12/01/22 (!) 144/96  09/13/22 (!) 141/86  08/30/22 133/74   Wt Readings from Last 3 Encounters:  12/01/22 155 lb 12.8 oz (70.7 kg)  09/13/22 163 lb 11.2 oz (74.3 kg)  08/30/22 166 lb 8 oz (75.5 kg)       Physical Exam  Physical Exam   VITALS: BP- 144/96 MEASUREMENTS: WT- lost 8 pounds since last visit EXTREMITIES: 1+ pitting edema in right lower extremity MUSCULOSKELETAL: Right lower extremity strength  4/5, left lower extremity strength 5/5    GEN: elderly male, well groomed, seated in transport chair  CARD: regularly irregular   Results for orders placed or performed in visit on 12/01/22  CMP14+EGFR  Result Value Ref Range   Glucose 151 (H) 70 - 99 mg/dL   BUN 18 8 - 27 mg/dL   Creatinine, Ser 1.61 (H) 0.76 - 1.27 mg/dL   eGFR 41 (L) >09 UE/AVW/0.98   BUN/Creatinine Ratio 11 10 - 24   Sodium 143 134 - 144 mmol/L   Potassium 3.0 (L) 3.5 - 5.2 mmol/L   Chloride 99 96 - 106 mmol/L   CO2 23 20 - 29 mmol/L   Calcium 9.0 8.6 - 10.2 mg/dL   Total Protein 6.2  6.0 - 8.5 g/dL   Albumin 4.0 3.7 - 4.7 g/dL   Globulin, Total 2.2 1.5 - 4.5 g/dL   Bilirubin Total 1.5 (H) 0.0 - 1.2 mg/dL   Alkaline Phosphatase 167 (H) 44 - 121 IU/L   AST 46 (H) 0 - 40 IU/L   ALT 31 0 - 44 IU/L  CBC  Result Value Ref Range   WBC 13.5 (H) 3.4 - 10.8 x10E3/uL   RBC 4.25 4.14 - 5.80 x10E6/uL   Hemoglobin 15.2 13.0 - 17.7 g/dL   Hematocrit 82.9 56.2 - 51.0 %   MCV 101 (H) 79 - 97 fL   MCH 35.8 (H) 26.6 - 33.0 pg   MCHC 35.3 31.5 - 35.7 g/dL   RDW 13.0 86.5 - 78.4 %   Platelets 314 150 - 450 x10E3/uL  Hemoglobin A1c  Result Value Ref Range   Hgb A1c MFr Bld 5.5 4.8 - 5.6 %   Est. average glucose Bld gHb Est-mCnc 111 mg/dL  ONG+[id number]  Result Value Ref Range   TSH 2.340 0.450 - 4.500 uIU/mL   T3, Free 3.5 2.0 - 4.4 pg/mL   Free T4 1.60 0.82 - 1.77 ng/dL  Sed Rate (ESR)  Result Value Ref Range   Sed Rate 10 0 - 30 mm/hr  C-reactive protein  Result Value Ref Range   CRP 25 (H) 0 - 10 mg/L    Assessment & Plan     Problem List Items Addressed This Visit       Cardiovascular and Mediastinum   Essential (primary) hypertension   Relevant Orders   CMP14+EGFR (Completed)   CBC (Completed)   TSH+T4F+T3Free (Completed)     Other   Unintentional weight loss   Relevant Orders   CMP14+EGFR (Completed)   CBC (Completed)   Hemoglobin A1c (Completed)   TSH+T4F+T3Free (Completed)   Sed Rate  (ESR) (Completed)   C-reactive protein (Completed)   Unable to ambulate   Relevant Orders   CMP14+EGFR (Completed)   CBC (Completed)   TSH+T4F+T3Free (Completed)   Ambulatory referral to Physical Therapy   MR BRAIN WO CONTRAST   Tremor   Relevant Orders   CMP14+EGFR (Completed)   CBC (Completed)   TSH+T4F+T3Free (Completed)   Falls frequently - Primary   Relevant Orders   CMP14+EGFR (Completed)   CBC (Completed)   TSH+T4F+T3Free (Completed)   Ambulatory referral to Physical Therapy   MR BRAIN WO CONTRAST   Elevated serum creatinine   Relevant Orders   US Renal   Dizziness   Relevant Orders   CMP14+EGFR (Completed)   CBC (Completed)   Hemoglobin A1c (Completed)   TSH+T4F+T3Free (Completed)   Ambulatory referral to Physical Therapy   Sed Rate (ESR) (Completed)   C-reactive protein (Completed)   MR BRAIN WO CONTRAST   Depression, recurrent (HCC)   Relevant Orders   CMP14+EGFR (Completed)   CBC (Completed)   TSH+T4F+T3Free (Completed)   Other Visit Diagnoses     Diarrhea, unspecified type       Relevant Orders   CMP14+EGFR (Completed)   CBC (Completed)   Hemoglobin A1c (Completed)   TSH+T4F+T3Free (Completed)   Sed Rate (ESR) (Completed)   C-reactive protein (Completed)   US Abdomen Limited RUQ (LIVER/GB)   Decreased appetite       Relevant Orders   CMP14+EGFR (Completed)   CBC (Completed)   Hemoglobin A1c (Completed)   TSH+T4F+T3Free (Completed)   Encounter for screening for diabetes mellitus       Relevant Orders   Hemoglobin A1c (  Completed)   Decreased GFR       Relevant Orders   US Renal          Orthostatic Hypotension Reports lightheadedness and dizziness upon standing, leading to falls. Symptoms present for several weeks. Home blood pressure readings are low. Possible contributing factors include dehydration and antihypertensive medications. Discussed risks of falls and potential for serious injury. Benefits of adjusting blood pressure  medications to reduce dizziness and falls. - Order complete metabolic panel, CBC, TSH, T4, T3, sed rate, and CRP - Adjust valsartan dosage to less frequent use - Maintain metoprolol at 25 mg - Continue Ambien 5 mg at night  Falls Frequent falls due to dizziness and lightheadedness. Sustained multiple scrapes and bruises. Risk of serious injury, especially with concurrent use of Eliquis. Discussed benefits of physical therapy and potential for imaging to identify underlying causes. Pt is in clinic seated in transport chair but has been ambulating independently since I met him in 2023. I am concerned for overall decline in his health regarding his ability to ambulate safely without profound dizziness and his and his friend's report of frequent falls in the last three months.  - Refer to physical therapy - Order MRI of the head - Discuss symptoms with neurology - Follow up with neurology  Diarrhea Explosive diarrhea for the past 2-3 weeks, leading to weight loss and decreased appetite. No recent antibiotic use. Possible dehydration contributing to symptoms. Discussed dietary modifications and over-the-counter treatments to manage symptoms. - Recommend over-the-counter anti-diarrheal (Imodium) - Suggest dietary modifications including BRAT diet and Metamucil - Encourage hydration  Neuropathy Neuropathy in feet, previously managed with gabapentin, which was discontinued due to concerns about dizziness. Currently using tramadol 50 mg twice daily. Discussed risks and benefits of continuing tramadol for pain management. - Continue tramadol 50 mg twice daily  Depression Chronic depression, well-managed with Zoloft 50 mg daily. Mood has improved. - Continue Zoloft 50 mg daily  General Health Maintenance Received flu shot in September.   Follow-up - Schedule follow-up appointment in 3-4 weeks - Order MRI of the head - Follow up with neurology - Provide handicap placard for car.      UPDATE   Reviewed patient's labs on 12/02/2022  Patient has elevated creatinine of 1.6 and GFR 41 down from 52 4 months ago  Patient also has elevated alk phosphatase of 167 which is increased 113 4 months ago his AST is still mildly elevated at 46 and bilirubin is now elevated at 1.5 in the setting of elevated white blood cell count of 13.5 and report of diarrhea recommended right upper quadrant ultrasound as well as renal ultrasound    Return in about 1 month (around 12/31/2022) for falls,dizzy, weight loss .      Total time spent on today's visit was , including both face-to-face time and reviewing specialistl notes from cardiology and neurology, chart review (specifically labs and imaging), treatment options, ordering referral for PT, answering both the patient's and his friend's questions and coordinating care.    [name], MD  [facility] [phone] (phone) [phone] (fax)  [facility]

## 2022-12-01 ENCOUNTER — Encounter: Payer: Self-pay | Admitting: Family Medicine

## 2022-12-01 ENCOUNTER — Ambulatory Visit (INDEPENDENT_AMBULATORY_CARE_PROVIDER_SITE_OTHER): Payer: Medicare Other | Admitting: Family Medicine

## 2022-12-01 VITALS — BP 144/96 | HR 100 | Resp 18 | Ht 68.0 in | Wt 155.8 lb

## 2022-12-01 DIAGNOSIS — R262 Difficulty in walking, not elsewhere classified: Secondary | ICD-10-CM | POA: Insufficient documentation

## 2022-12-01 DIAGNOSIS — Z131 Encounter for screening for diabetes mellitus: Secondary | ICD-10-CM

## 2022-12-01 DIAGNOSIS — R634 Abnormal weight loss: Secondary | ICD-10-CM | POA: Diagnosis not present

## 2022-12-01 DIAGNOSIS — F339 Major depressive disorder, recurrent, unspecified: Secondary | ICD-10-CM

## 2022-12-01 DIAGNOSIS — R7989 Other specified abnormal findings of blood chemistry: Secondary | ICD-10-CM | POA: Diagnosis not present

## 2022-12-01 DIAGNOSIS — R197 Diarrhea, unspecified: Secondary | ICD-10-CM

## 2022-12-01 DIAGNOSIS — R944 Abnormal results of kidney function studies: Secondary | ICD-10-CM | POA: Diagnosis not present

## 2022-12-01 DIAGNOSIS — R296 Repeated falls: Secondary | ICD-10-CM | POA: Diagnosis not present

## 2022-12-01 DIAGNOSIS — R251 Tremor, unspecified: Secondary | ICD-10-CM

## 2022-12-01 DIAGNOSIS — I10 Essential (primary) hypertension: Secondary | ICD-10-CM

## 2022-12-01 DIAGNOSIS — R630 Anorexia: Secondary | ICD-10-CM

## 2022-12-01 DIAGNOSIS — R42 Dizziness and giddiness: Secondary | ICD-10-CM

## 2022-12-01 NOTE — Patient Instructions (Signed)
VISIT SUMMARY:  [name], you visited [place] today due to worsening symptoms including lightheadedness, nausea, falls, decreased appetite, weight loss, diarrhea, neuropathy, and sleep difficulties. We discussed your current medications and made some adjustments to help manage your symptoms better.  YOUR PLAN:  -ORTHOSTATIC HYPOTENSION: Orthostatic hypotension is a condition where your blood pressure drops significantly when you stand up, causing dizziness and lightheadedness. We will adjust your valsartan dosage to less frequent use and maintain your metoprolol at 25 mg. We also ordered several blood tests to check for underlying issues and will continue your Ambien 5 mg at night.  -FALLS: Your frequent falls are likely due to dizziness and lightheadedness. We will refer you to physical therapy, order an MRI of your head, and discuss your symptoms with a neurologist to identify any underlying causes.  -DIARRHEA: You have been experiencing explosive diarrhea, which has led to weight loss and decreased appetite. We recommend using an over-the-counter anti-diarrheal like Imodium, making dietary modifications including the BRAT diet, and encouraging hydration.  -NEUROPATHY: Neuropathy is nerve damage that causes pain and numbness, often in the feet. You will continue taking tramadol 50 mg twice daily for pain management.  -DEPRESSION: Your chronic depression is well-managed with Zoloft 50 mg daily, and your mood has improved. We will continue this medication.  -GENERAL HEALTH MAINTENANCE: You received your flu shot in September. No additional general health maintenance is needed at this time.  INSTRUCTIONS:  Please schedule a follow-up appointment in 3-4 weeks. We have ordered an MRI of your head and will follow up with neurology. Additionally, we will provide you with a handicap placard for your car.

## 2022-12-02 LAB — CMP14+EGFR
ALT: 31 [IU]/L (ref 0–44)
AST: 46 [IU]/L — ABNORMAL HIGH (ref 0–40)
Albumin: 4 g/dL (ref 3.7–4.7)
Alkaline Phosphatase: 167 [IU]/L — ABNORMAL HIGH (ref 44–121)
BUN/Creatinine Ratio: 11 (ref 10–24)
BUN: 18 mg/dL (ref 8–27)
Bilirubin Total: 1.5 mg/dL — ABNORMAL HIGH (ref 0.0–1.2)
CO2: 23 mmol/L (ref 20–29)
Calcium: 9 mg/dL (ref 8.6–10.2)
Chloride: 99 mmol/L (ref 96–106)
Creatinine, Ser: 1.63 mg/dL — ABNORMAL HIGH (ref 0.76–1.27)
Globulin, Total: 2.2 g/dL (ref 1.5–4.5)
Glucose: 151 mg/dL — ABNORMAL HIGH (ref 70–99)
Potassium: 3 mmol/L — ABNORMAL LOW (ref 3.5–5.2)
Sodium: 143 mmol/L (ref 134–144)
Total Protein: 6.2 g/dL (ref 6.0–8.5)
eGFR: 41 mL/min/{1.73_m2} — ABNORMAL LOW (ref >59)

## 2022-12-02 LAB — TSH+T4F+T3FREE
Free T4: 1.6 ng/dL (ref 0.82–1.77)
T3, Free: 3.5 pg/mL (ref 2.0–4.4)
TSH: 2.34 u[IU]/mL (ref 0.450–4.500)

## 2022-12-02 LAB — CBC
Hematocrit: 43.1 % (ref 37.5–51.0)
Hemoglobin: 15.2 g/dL (ref 13.0–17.7)
MCH: 35.8 pg — ABNORMAL HIGH (ref 26.6–33.0)
MCHC: 35.3 g/dL (ref 31.5–35.7)
MCV: 101 fL — ABNORMAL HIGH (ref 79–97)
Platelets: 314 10*3/uL (ref 150–450)
RBC: 4.25 x10E6/uL (ref 4.14–5.80)
RDW: 12.5 % (ref 11.6–15.4)
WBC: 13.5 10*3/uL — ABNORMAL HIGH (ref 3.4–10.8)

## 2022-12-02 LAB — HEMOGLOBIN A1C
Est. average glucose Bld gHb Est-mCnc: 111 mg/dL
Hgb A1c MFr Bld: 5.5 % (ref 4.8–5.6)

## 2022-12-02 LAB — SEDIMENTATION RATE: Sed Rate: 10 mm/h (ref 0–30)

## 2022-12-02 LAB — C-REACTIVE PROTEIN: CRP: 25 mg/L — ABNORMAL HIGH (ref 0–10)

## 2022-12-02 NOTE — Addendum Note (Signed)
Addended by: Bing Neighbors on: 12/02/2022 11:35 AM   Modules accepted: Orders

## 2022-12-19 ENCOUNTER — Other Ambulatory Visit: Payer: Self-pay | Admitting: Family Medicine

## 2022-12-19 DIAGNOSIS — I10 Essential (primary) hypertension: Secondary | ICD-10-CM

## 2022-12-19 DIAGNOSIS — F339 Major depressive disorder, recurrent, unspecified: Secondary | ICD-10-CM

## 2022-12-20 DIAGNOSIS — I495 Sick sinus syndrome: Secondary | ICD-10-CM | POA: Diagnosis not present

## 2022-12-21 ENCOUNTER — Ambulatory Visit: Payer: Medicare Other | Admitting: Emergency Medicine

## 2022-12-21 VITALS — Ht 67.5 in | Wt 155.0 lb

## 2022-12-21 DIAGNOSIS — Z0000 Encounter for general adult medical examination without abnormal findings: Secondary | ICD-10-CM

## 2022-12-21 NOTE — Progress Notes (Signed)
Subjective:   Wesley Small is a 87 y.o. male who presents for Medicare Annual/Subsequent preventive examination.  Visit Complete: Virtual I connected with  Wesley Small on 12/21/22 by a audio enabled telemedicine application and verified that I am speaking with the correct person using two identifiers.  Patient Location: Home  Provider Location: Home Office  I discussed the limitations of evaluation and management by telemedicine. The patient expressed understanding and agreed to proceed.  Vital Signs: Because this visit was a virtual/telehealth visit, some criteria may be missing or patient reported. Any vitals not documented were not able to be obtained and vitals that have been documented are patient reported.   Cardiac Risk Factors include: advanced age (>25men, >74 women);male gender;hypertension;dyslipidemia     Objective:    Today's Vitals   12/21/22 1520  Weight: 155 lb (70.3 kg)  Height: 5' 7.5" (1.715 m)   Body mass index is 23.92 kg/m.     12/21/2022    3:39 PM 07/10/2022    4:18 PM 12/13/2021    3:21 PM 09/12/2019   10:47 AM 09/10/2018   10:37 AM 08/15/2017   10:48 AM 12/29/2016    8:52 AM  Advanced Directives  Does Patient Have a Medical Advance Directive? Yes No No Yes Yes Yes Yes  Type of Estate agent of [place];Living will   Healthcare Power of [place];Living will Healthcare Power of [place];Living will Healthcare Power of [place];Living will Healthcare Power of [place];Living will  Does patient want to make changes to medical advance directive? No - Patient declined      No - Patient declined  Copy of Healthcare Power of Attorney in Chart? Yes - validated most recent copy scanned in chart (See row information)   No - copy requested No - copy requested No - copy requested Yes  Would patient like information on creating a medical advance directive?  No - Patient declined No - Patient declined        Current Medications  (verified) Outpatient Encounter Medications as of 12/21/2022  Medication Sig   ELIQUIS 5 MG TABS tablet TAKE ONE TABLET BY MOUTH TWO TMES DAILY   metoprolol succinate (TOPROL-XL) 25 MG 24 hr tablet Take 1 tablet (25 mg total) by mouth daily.   sertraline (ZOLOFT) 50 MG tablet TAKE ONE TABLET (50 MG TOTAL) BY MOUTH DAILY.   traMADol (ULTRAM) 50 MG tablet TAKE ONE TABLET BY MOUTH TWICE A DAY AS NEEDED FOR PAIN   valsartan (DIOVAN) 160 MG tablet Take 0.5 tablets (80 mg total) by mouth daily.   zolpidem (AMBIEN) 5 MG tablet TAKE ONE TABLET (5 MG TOTAL) BY MOUTH AT BEDTIME AS NEEDED FOR SLEEP.   gabapentin (NEURONTIN) 100 MG capsule TAKE ONE CAPSULE BY MOUTH TWICE TIMES DAILY FOR ONE WEEK, THEN INCREASE TO TWO CAPSULES BY MOUTH TWICE DAILY (Patient not taking: Reported on 12/21/2022)   [START ON 01/05/2023] traMADol (ULTRAM) 50 MG tablet TAKE ONE TABLET BY MOUTH TWICE A DAY AS NEEDED FOR PAIN (Patient not taking: Reported on 12/21/2022)   traMADol (ULTRAM) 50 MG tablet TAKE ONE TABLET BY MOUTH TWICE A DAY AS NEEDED FOR PAIN (Patient not taking: Reported on 12/21/2022)   No facility-administered encounter medications on file as of 12/21/2022.    Allergies (verified) Penicillins   History: Past Medical History:  Diagnosis Date   Arthritis    Dysrhythmia    RBBB   GERD (gastroesophageal reflux disease)    History of blood transfusion  History of diverticulitis    History of hiatal hernia    HOH (hard of hearing)    Hypercholesterolemia    Hypertension    Presence of permanent cardiac pacemaker    Sleep apnea    Past Surgical History:  Procedure Laterality Date   back injection     CATARACT EXTRACTION W/PHACO Right 11/24/2016   Procedure: CATARACT EXTRACTION PHACO AND INTRAOCULAR LENS PLACEMENT (IOC);  Surgeon: Nevada Crane, MD;  Location: [facility] ORS;  Service: Ophthalmology;  Laterality: Right;  Lot # [id number] H [place]:   00.31  AP%: 10.9 CDE:  3.39   CATARACT EXTRACTION W/PHACO  Left 12/29/2016   Procedure: CATARACT EXTRACTION PHACO AND INTRAOCULAR LENS PLACEMENT (IOC);  Surgeon: Nevada Crane, MD;  Location: [facility] ORS;  Service: Ophthalmology;  Laterality: Left;  [place] 00:33.8 AP% 8.5 CDE 2.86 Fluid Pack lot # [id number]   INJECTION KNEE     INJECTION KNEE     PACEMAKER INSERTION     SKIN GRAFT     TONSILLECTOMY AND ADENOIDECTOMY     TRANSURETHRAL RESECTION OF PROSTATE     Family History  Problem Relation Age of Onset   Heart disease Mother        pacemaker   Depression Mother    Heart disease Father    Social History   Socioeconomic History   Marital status: Widowed    Spouse name: Not on file   Number of children: 1   Years of education: Not on file   Highest education level: Some college, no degree  Occupational History   Occupation: retired  Tobacco Use   Smoking status: Former    Current packs/day: 0.00    Average packs/day: 1 pack/day for 9.0 years (9.0 ttl pk-yrs)    Types: Cigarettes    Start date: 49    Quit date: 1965    Years since quitting: 59.9   Smokeless tobacco: Never   Tobacco comments:    quit around 29 (87 yo)  Vaping Use   Vaping status: Never Used  Substance and Sexual Activity   Alcohol use: Yes    Alcohol/week: 7.0 standard drinks of alcohol    Types: 7 Shots of liquor per week    Comment: 2 oz a day of high ball "bourbon"   Drug use: No   Sexual activity: Not on file  Other Topics Concern   Not on file  Social History Narrative   Not on file   Social Determinants of Health   Financial Resource Strain: Low Risk  (12/21/2022)   Overall Financial Resource Strain (CARDIA)    Difficulty of Paying Living Expenses: Not hard at all  Food Insecurity: No Food Insecurity (12/21/2022)   Hunger Vital Sign    Worried About Running Out of Food in the Last Year: Never true    Ran Out of Food in the Last Year: Never true  Transportation Needs: No Transportation Needs (12/21/2022)   PRAPARE - Doctor, general practice (Medical): No    Lack of Transportation (Non-Medical): No  Physical Activity: Inactive (12/21/2022)   Exercise Vital Sign    Days of Exercise per Week: 0 days    Minutes of Exercise per Session: 0 min  Stress: No Stress Concern Present (12/21/2022)   [employer] of Occupational Health - Occupational Stress Questionnaire    Feeling of Stress : Not at all  Social Connections: Moderately Isolated (12/21/2022)   Social Connection and Isolation Panel [NHANES]    Frequency  of Communication with Friends and Family: More than three times a week    Frequency of Social Gatherings with Friends and Family: Three times a week    Attends Religious Services: Never    Active Member of Clubs or Organizations: Yes    Attends Banker Meetings: Never    Marital Status: Widowed    Tobacco Counseling Counseling given: Not Answered Tobacco comments: quit around 35 (87 yo)   Clinical Intake:  Pre-visit preparation completed: Yes  Pain : No/denies pain     BMI - recorded: 23.92 Nutritional Status: BMI of 19-24  Normal Nutritional Risks: None Diabetes: No  How often do you need to have someone help you when you read instructions, pamphlets, or other written materials from your doctor or pharmacy?: 1 - Never  Interpreter Needed?: No  Information entered by :: Tora Kindred, CMA   Activities of Daily Living    12/21/2022    3:23 PM 06/27/2022    1:09 PM  In your present state of health, do you have any difficulty performing the following activities:  Hearing? 1 1  Comment wears hearing aids   Vision? 0 0  Difficulty concentrating or making decisions? 1 0  Comment concentration   Walking or climbing stairs? 1 1  Comment due to knee problems   Dressing or bathing? 0 0  Doing errands, shopping? 0 0  Preparing Food and eating ? N   Using the Toilet? N   In the past six months, have you accidently leaked urine? N   Do you have problems with loss of  bowel control? Y   Comment wears depends   Managing your Medications? N   Managing your Finances? N   Housekeeping or managing your Housekeeping? N     Patient Care Team: [name], MD as PCP - General (Family Medicine) Nevada Crane, MD as Consulting Physician (Ophthalmology) Juanell Fairly, MD as Referring Physician (Orthopedic Surgery) Dimmig, Maisie Fus, MD as Referring Physician (Orthopedic Surgery) Dalia Heading, MD as Consulting Physician (Cardiology) Deirdre Evener, MD as Consulting Physician (Dermatology)  Indicate any recent Medical Services you may have received from other than [facility] providers in the past year (date may be approximate).     Assessment:   This is a routine wellness examination for [place].  Hearing/Vision screen Hearing Screening - Comments:: Wears hearing aids Vision Screening - Comments:: Gets eye exams   Goals Addressed               This Visit's Progress     Patient Stated (pt-stated)        Would like to have more energy      Depression Screen    12/21/2022    3:37 PM 12/01/2022    9:01 AM 08/30/2022    3:04 PM 08/09/2022    2:33 PM 06/27/2022    1:09 PM 03/24/2022    1:13 PM 12/13/2021    3:18 PM  PHQ 2/9 Scores  PHQ - 2 Score 0 3 3 5 2  0 0  PHQ- 9 Score 1 13 10 13 5   0    Fall Risk    12/21/2022    3:40 PM 12/01/2022    9:01 AM 06/27/2022    1:09 PM 03/24/2022    1:12 PM 12/13/2021    3:21 PM  Fall Risk   Falls in the past year? 1 1 1 1 1   Number falls in past yr: 1 1  1  0  Comment "10  falls in the last year"      Injury with Fall? 0 0 0 0 0  Risk for fall due to : History of fall(s);Impaired balance/gait;Orthopedic patient;Impaired mobility  History of fall(s) Other (Comment) History of fall(s)  Follow up Education provided;Falls prevention discussed;Falls evaluation completed  Falls evaluation completed Falls evaluation completed Falls evaluation completed;Falls prevention discussed    MEDICARE  RISK AT HOME: Medicare Risk at Home Any stairs in or around the home?: Yes If so, are there any without handrails?: No Home free of loose throw rugs in walkways, pet beds, electrical cords, etc?: Yes Adequate lighting in your home to reduce risk of falls?: Yes Life alert?: Yes Use of a cane, walker or w/c?: No Grab bars in the bathroom?: Yes Shower chair or bench in shower?: Yes Elevated toilet seat or a handicapped toilet?: No  TIMED UP AND GO:  Was the test performed?  No    Cognitive Function:        12/21/2022    3:42 PM 12/13/2021    3:29 PM 09/12/2019   10:54 AM 09/10/2018   10:47 AM 08/02/2016    9:53 AM  6CIT Screen  What Year? 0 points 0 points 0 points 0 points 0 points  What month? 0 points 0 points 0 points 0 points 0 points  What time? 0 points 0 points 0 points 0 points 0 points  Count back from 20 0 points 0 points 0 points 0 points 0 points  Months in reverse 4 points 2 points 2 points 0 points 0 points  Repeat phrase 0 points 2 points 0 points 0 points 0 points  Total Score 4 points 4 points 2 points 0 points 0 points    Immunizations Immunization History  Administered Date(s) Administered   Fluad Quad(high Dose 65+) 10/27/2020   Fluad Trivalent(High Dose 65+) 09/13/2022   Influenza, High Dose Seasonal PF 11/10/2014, 11/04/2015, 10/09/2017, 10/19/2018   PFIZER Comirnaty(Gray Top)Covid-19 Tri-Sucrose Vaccine 02/10/2019, 03/05/2019   PFIZER(Purple Top)SARS-COV-2 Vaccination 02/10/2019, 03/05/2019   Pneumococcal Conjugate-13 05/05/2014   Pneumococcal Polysaccharide-23 08/02/2016   Tdap 10/12/2010   Zoster Recombinant(Shingrix) 11/10/2016, 01/26/2017    TDAP status: Due, Education has been provided regarding the importance of this vaccine. Advised may receive this vaccine at local pharmacy or Health Dept. Aware to provide a copy of the vaccination record if obtained from local pharmacy or Health Dept. Verbalized acceptance and understanding.  Flu Vaccine  status: Up to date  Pneumococcal vaccine status: Up to date  Covid-19 vaccine status: Declined, Education has been provided regarding the importance of this vaccine but patient still declined. Advised may receive this vaccine at local pharmacy or Health Dept.or vaccine clinic. Aware to provide a copy of the vaccination record if obtained from local pharmacy or Health Dept. Verbalized acceptance and understanding.  Qualifies for Shingles Vaccine? Yes   Zostavax completed No   Shingrix Completed?: Yes  Screening Tests Health Maintenance  Topic Date Due   DTaP/Tdap/Td (2 - Td or Tdap) 10/11/2020   COVID-19 Vaccine (5 - 2023-24 season) 09/11/2022   Medicare Annual Wellness (AWV)  12/21/2023   Pneumonia Vaccine 28+ Years old  Completed   INFLUENZA VACCINE  Completed   Zoster Vaccines- Shingrix  Completed   HPV VACCINES  Aged Out    Health Maintenance  Health Maintenance Due  Topic Date Due   DTaP/Tdap/Td (2 - Td or Tdap) 10/11/2020   COVID-19 Vaccine (5 - 2023-24 season) 09/11/2022    Colorectal cancer screening: No  longer required.   Lung Cancer Screening: (Low Dose CT Chest recommended if Age 64-80 years, 20 pack-year currently smoking OR have quit w/in 15years.) does not qualify.   Lung Cancer Screening Referral: n/a  Additional Screening:  Hepatitis C Screening: does not qualify;   Vision Screening: Recommended annual ophthalmology exams for early detection of glaucoma and other disorders of the eye.  Dental Screening: Recommended annual dental exams for proper oral hygiene  Community Resource Referral / Chronic Care Management: CRR required this visit?  No   CCM required this visit?  No     Plan:     I have personally reviewed and noted the following in the patient's chart:   Medical and social history Use of alcohol, tobacco or illicit drugs  Current medications and supplements including opioid prescriptions. Patient is not currently taking opioid  prescriptions. Functional ability and status Nutritional status Physical activity Advanced directives List of other physicians Hospitalizations, surgeries, and ER visits in previous 12 months Vitals Screenings to include cognitive, depression, and falls Referrals and appointments  In addition, I have reviewed and discussed with patient certain preventive protocols, quality metrics, and best practice recommendations. A written personalized care plan for preventive services as well as general preventive health recommendations were provided to patient.     Tora Kindred, CMA   12/21/2022   After Visit Summary: (MyChart) Due to this being a telephonic visit, the after visit summary with patients personalized plan was offered to patient via MyChart   Nurse Notes:  6 CIT Score - 4 Needs Tdap vaccine. Patient c/o lightheadedness. Advised to discuss with you at next OV on 12/29/22 or if symptoms worsen go to the emergency room. Declined covid vaccine.

## 2022-12-21 NOTE — Patient Instructions (Addendum)
Wesley Small , Thank you for taking time to come for your Medicare Wellness Visit. I appreciate your ongoing commitment to your health goals. Please review the following plan we discussed and let me know if I can assist you in the future.   Referrals/Orders/Follow-Ups/Clinician Recommendations: Get a tetanus shot at your earliest convenience. Discuss the lightheadedness that you are experiencing with Dr. Roxan Hockey at your OV on 12/29/22. If this gets worse, or is associated with other symptoms, please go to the nearest emergency room.  This is a list of the screening recommended for you and due dates:  Health Maintenance  Topic Date Due   DTaP/Tdap/Td vaccine (2 - Td or Tdap) 10/11/2020   COVID-19 Vaccine (5 - 2023-24 season) 09/11/2022   Medicare Annual Wellness Visit  12/21/2023   Pneumonia Vaccine  Completed   Flu Shot  Completed   Zoster (Shingles) Vaccine  Completed   HPV Vaccine  Aged Out    Advanced directives: (In Chart) A copy of your advanced directives are scanned into your chart should your provider ever need it.  Next Medicare Annual Wellness Visit scheduled for next year: Yes, 12/27/23 @ 3:50pm  Fall Prevention in the Home, Adult Falls can cause injuries and affect people of all ages. There are many simple things that you can do to make your home safe and to help prevent falls. If you need it, ask for help making these changes. What actions can I take to prevent falls? General information Use good lighting in all rooms. Make sure to: Replace any light bulbs that burn out. Turn on lights if it is dark and use night-lights. Keep items that you use often in easy-to-reach places. Lower the shelves around your home if needed. Move furniture so that there are clear paths around it. Do not keep throw rugs or other things on the floor that can make you trip. If any of your floors are uneven, fix them. Add color or contrast paint or tape to clearly mark and help you see: Grab  bars or handrails. First and last steps of staircases. Where the edge of each step is. If you use a ladder or stepladder: Make sure that it is fully opened. Do not climb a closed ladder. Make sure the sides of the ladder are locked in place. Have someone hold the ladder while you use it. Know where your pets are as you move through your home. What can I do in the bathroom?     Keep the floor dry. Clean up any water that is on the floor right away. Remove soap buildup in the bathtub or shower. Buildup makes bathtubs and showers slippery. Use non-skid mats or decals on the floor of the bathtub or shower. Attach bath mats securely with double-sided, non-slip rug tape. If you need to sit down while you are in the shower, use a non-slip stool. Install grab bars by the toilet and in the bathtub and shower. Do not use towel bars as grab bars. What can I do in the bedroom? Make sure that you have a light by your bed that is easy to reach. Do not use any sheets or blankets on your bed that hang to the floor. Have a firm bench or chair with side arms that you can use for support when you get dressed. What can I do in the kitchen? Clean up any spills right away. If you need to reach something above you, use a sturdy step stool that has a grab  bar. Keep electrical cables out of the way. Do not use floor polish or wax that makes floors slippery. What can I do with my stairs? Do not leave anything on the stairs. Make sure that you have a light switch at the top and the bottom of the stairs. Have them installed if you do not have them. Make sure that there are handrails on both sides of the stairs. Fix handrails that are broken or loose. Make sure that handrails are as long as the staircases. Install non-slip stair treads on all stairs in your home if they do not have carpet. Avoid having throw rugs at the top or bottom of stairs, or secure the rugs with carpet tape to prevent them from  moving. Choose a carpet design that does not hide the edge of steps on the stairs. Make sure that carpet is firmly attached to the stairs. Fix any carpet that is loose or worn. What can I do on the outside of my home? Use bright outdoor lighting. Repair the edges of walkways and driveways and fix any cracks. Clear paths of anything that can make you trip, such as tools or rocks. Add color or contrast paint or tape to clearly mark and help you see high doorway thresholds. Trim any bushes or trees on the main path into your home. Check that handrails are securely fastened and in good repair. Both sides of all steps should have handrails. Install guardrails along the edges of any raised decks or porches. Have leaves, snow, and ice cleared regularly. Use sand, salt, or ice melt on walkways during winter months if you live where there is ice and snow. In the garage, clean up any spills right away, including grease or oil spills. What other actions can I take? Review your medicines with your health care provider. Some medicines can make you confused or feel dizzy. This can increase your chance of falling. Wear closed-toe shoes that fit well and support your feet. Wear shoes that have rubber soles and low heels. Use a cane, walker, scooter, or crutches that help you move around if needed. Talk with your provider about other ways that you can decrease your risk of falls. This may include seeing a physical therapist to learn to do exercises to improve movement and strength. Where to find more information Centers for Disease Control and Prevention, [employer]: TonerPromos.no [employer] on Aging: BaseRingTones.pl National Institute on Aging: BaseRingTones.pl Contact a health care provider if: You are afraid of falling at home. You feel weak, drowsy, or dizzy at home. You fall at home. Get help right away if you: Lose consciousness or have trouble moving after a fall. Have a fall that causes a head injury. These  symptoms may be an emergency. Get help right away. Call 911. Do not wait to see if the symptoms will go away. Do not drive yourself to the hospital. This information is not intended to replace advice given to you by your health care provider. Make sure you discuss any questions you have with your health care provider. Document Revised: 08/30/2021 Document Reviewed: 08/30/2021 Elsevier Patient Education  2024 [employer].

## 2022-12-27 NOTE — Progress Notes (Signed)
Established patient visit   Patient: Wesley Small   DOB: 09-Dec-1935   87 y.o. Male  MRN: [id number] Visit Date: 12/29/2022  Today's healthcare provider: [name], MD   Chief Complaint  Patient presents with   Follow-up   Subjective       Discussed the use of AI scribe software for clinical note transcription with the patient, who gave verbal consent to proceed.  History of Present Illness   The patient, with a history of DVT, pacemaker placement for sick sinus syndrome, and  tremors, presents with a constellation of symptoms including dizziness, nausea, and difficulty walking and non-intentional weight loss.  The dizziness is described as a daily occurrence, particularly upon standing from a seated position, and is often accompanied by nausea. The patient also reports a significant decline in mobility, now requiring assistance for walking.  The patient's dizziness and nausea have been ongoing since the initiation of gabapentin for neuropathy, prescribed by a neurologist. The patient has an upcoming neurology appointment scheduled, but expresses reluctance to continue these visits due to the resolution of the initial tremor symptom.  In addition to these symptoms, the patient reports a significant decrease in appetite and subsequent weight loss. The patient's eating habits have changed drastically, with reports of frequent vomiting and a lack of interest in food.  The patient also reports a history of falls, with several instances in the past few weeks where his legs have collapsed, leading to falls without significant injury.  The patient has a history of alcohol consumption, with a daily intake of three drinks, specifically bourbon and water. This habit has been ongoing for several years.  The patient also mentions a recent visit to a cardiologist due to a perceived return of pulse irregularities, which were the initial indication for pacemaker placement.  The patient expresses a desire for the current provider to consult with the cardiologist for further insight.  Lastly, the patient reports a history of a cyst on one kidney, discovered during a previous ultrasound. The patient also has a known diagnosis of fatty liver disease.   Patient's friend is present for visit and reports concern for patient's safety in the home. He currently lives alone and has neighbors who visit and check on the patient. She reports that he has been found on the floor or in bed late into the day. She states he is not eating and not able to bathe himself due to concern for lack of safety in the shower. She also reports finding blood in the patient's sheets without visualization of skin injuries and she is concerned he may have bloody stools without realizing it.        Past Medical History:  Diagnosis Date   Arthritis    Dysrhythmia    RBBB   GERD (gastroesophageal reflux disease)    History of blood transfusion    History of diverticulitis    History of hiatal hernia    HOH (hard of hearing)    Hypercholesterolemia    Hypertension    Presence of permanent cardiac pacemaker    Sleep apnea     Medications: Outpatient Medications Prior to Visit  Medication Sig   ELIQUIS 5 MG TABS tablet TAKE ONE TABLET BY MOUTH TWO TMES DAILY   gabapentin (NEURONTIN) 100 MG capsule    metoprolol succinate (TOPROL-XL) 25 MG 24 hr tablet Take 1 tablet (25 mg total) by mouth daily.   sertraline (ZOLOFT) 50 MG tablet TAKE ONE TABLET (  50 MG TOTAL) BY MOUTH DAILY.   [START ON 01/05/2023] traMADol (ULTRAM) 50 MG tablet TAKE ONE TABLET BY MOUTH TWICE A DAY AS NEEDED FOR PAIN   traMADol (ULTRAM) 50 MG tablet TAKE ONE TABLET BY MOUTH TWICE A DAY AS NEEDED FOR PAIN   traMADol (ULTRAM) 50 MG tablet TAKE ONE TABLET BY MOUTH TWICE A DAY AS NEEDED FOR PAIN   valsartan (DIOVAN) 160 MG tablet Take 0.5 tablets (80 mg total) by mouth daily.   zolpidem (AMBIEN) 5 MG tablet TAKE ONE TABLET (5 MG  TOTAL) BY MOUTH AT BEDTIME AS NEEDED FOR SLEEP.   No facility-administered medications prior to visit.    Review of Systems  Last metabolic panel Lab Results  Component Value Date   GLUCOSE 130 (H) 12/29/2022   NA 144 12/29/2022   K 3.2 (L) 12/29/2022   CL 96 12/29/2022   CO2 28 12/29/2022   BUN 20 12/29/2022   CREATININE 1.60 (H) 12/29/2022   EGFR 42 (L) 12/29/2022   CALCIUM 8.9 12/29/2022   PHOS 2.3 (L) 12/29/2022   PROT 6.2 12/01/2022   ALBUMIN 3.5 (L) 12/29/2022   LABGLOB 2.2 12/01/2022   AGRATIO 2.1 10/28/2021   BILITOT 1.5 (H) 12/01/2022   ALKPHOS 167 (H) 12/01/2022   AST 46 (H) 12/01/2022   ALT 31 12/01/2022   ANIONGAP 14 07/10/2022        Objective    BP 120/76   Pulse 88   Resp 16   Ht 5' 7.5" (1.715 m)   Wt 152 lb (68.9 kg)   SpO2 99%   BMI 23.46 kg/m    BP Readings from Last 3 Encounters:  12/29/22 120/76  12/01/22 (!) 144/96  09/13/22 (!) 141/86   Wt Readings from Last 3 Encounters:  12/29/22 152 lb (68.9 kg)  12/21/22 155 lb (70.3 kg)  12/01/22 155 lb 12.8 oz (70.7 kg)        Physical Exam  General: elderly male seated in transport chair in NAD but is cachetic appearing with facial muscle wasting, visibly decreased weight in the neck  Cardio: Normal S1 and S2, RRR today  Pulm: CTAB, normal work of breathing Abdomen: Bowel sounds normal. Abdomen soft and non-tender; non distended, no masses palpated  Extremities: RLE edema, LLE deformity noted (chronic) no edema on left    Results for orders placed or performed in visit on 12/29/22  Brain natriuretic peptide  Result Value Ref Range   BNP WILL FOLLOW   Renal Function Panel  Result Value Ref Range   Glucose 130 (H) 70 - 99 mg/dL   BUN 20 8 - 27 mg/dL   Creatinine, Ser 7.42 (H) 0.76 - 1.27 mg/dL   eGFR 42 (L) >59 DG/LOV/5.64   BUN/Creatinine Ratio 13 10 - 24   Sodium 144 134 - 144 mmol/L   Potassium 3.2 (L) 3.5 - 5.2 mmol/L   Chloride 96 96 - 106 mmol/L   CO2 28 20 - 29 mmol/L    Calcium 8.9 8.6 - 10.2 mg/dL   Phosphorus 2.3 (L) 2.8 - 4.1 mg/dL   Albumin 3.5 (L) 3.7 - 4.7 g/dL  CBC  Result Value Ref Range   WBC 9.7 3.4 - 10.8 x10E3/uL   RBC 3.81 (L) 4.14 - 5.80 x10E6/uL   Hemoglobin 13.6 13.0 - 17.7 g/dL   Hematocrit 33.2 95.1 - 51.0 %   MCV 101 (H) 79 - 97 fL   MCH 35.7 (H) 26.6 - 33.0 pg   MCHC 35.5 31.5 -  35.7 g/dL   RDW 16.1 09.6 - 04.5 %   Platelets 446 150 - 450 x10E3/uL    Assessment & Plan     Problem List Items Addressed This Visit       Cardiovascular and Mediastinum   Sick sinus syndrome (HCC)   Chronic  Managed by cardiology with recent procedural visit on 12/20/22 Will request earlier follow up with Dr. Darrold Junker due to orthostatic hypotension and dizziness       Relevant Orders   Brain natriuretic peptide (Completed)   Ambulatory referral to Home Health   Essential (primary) hypertension   Chronic  Within goal range today  Continue valsartan 80mg  daily  Continue metoprolol 25mg  daily       Relevant Orders   Brain natriuretic peptide (Completed)   Deep vein thrombosis (DVT) of popliteal vein of right lower extremity (HCC)   Relevant Orders   Brain natriuretic peptide (Completed)     Other   Unintentional weight loss   Significant weight loss and poor appetite, with concerns about potential underlying causes such as malignancy or other systemic issues. Patient reports difficulty finding appetizing food and frequent nausea. Discussed potential outcomes of untreated weight loss, including further decline in health and need for assisted living. - Order CT scan of abdomen and pelvis - Request home health services for assistance with meal preparation and monitoring      Relevant Orders   Brain natriuretic peptide (Completed)   Renal Function Panel (Completed)   Ambulatory referral to Home Health   CT ABDOMEN PELVIS WO CONTRAST   Unable to ambulate   Relevant Orders   Brain natriuretic peptide (Completed)   Ambulatory  referral to Home Health   Hypercholesterolemia   Relevant Orders   Brain natriuretic peptide (Completed)   Falls frequently   Relevant Orders   Brain natriuretic peptide (Completed)   Ambulatory referral to Home Health   CBC (Completed)   Failure to thrive in adult - Primary   Persistent dizziness and nausea upon standing, likely related to orthostatic hypotension. Possible contributing factors include blood pressure fluctuations, pacemaker function, and gabapentin side effects. Discussed risks of untreated orthostatic hypotension, including falls and injury. Patient prefers to avoid unnecessary hospital visits unless vitals are unstable. - Order CT scan of abdomen and pelvis, cancel RUQ [place] and renal US previously ordered  - Order renal function panel to follow up on elevated creatinine of 1.63 - Order RFP - Order BNP (to evaluate for possible HF given edema, weight loss, dizziness) - Contact cardiology office to request next available appt for orthostatic hypotension and dizziness as well as any other cardiac testing recommendations given recent decline of overall health status  - Request home health services including nursing, physical therapy, occupational therapy, and home health aid due to patient's inability to ambulate safely, decreased oral intake, fatigue, inability to complete ADLs and living alone       Elevated serum creatinine   Repeat RFP today        Relevant Orders   Brain natriuretic peptide (Completed)   Renal Function Panel (Completed)   Ambulatory referral to Home Health   CT ABDOMEN PELVIS WO CONTRAST   Dizziness   Chronic  Worsening  Previously ordered brain MRI, pt has not been able to schedule this as of yet  Referral coordinator has not been able to contact patient to schedule imaging  Patient's designated party release updated to include his SO, Delsa Bern to assist with scheduling  Patient and Myriam Jacobson  given contact information to return scheduling call        Relevant Orders   Brain natriuretic peptide (Completed)   Ambulatory referral to Home Health   CBC (Completed)   Depression, recurrent (HCC)   Chronic  Worsening given malaise, decreased appetite  Hesitant to make any medication changes to zoloft 50mg  due to patient's current status  Will continue current regimen for now and reassess after additional evaluations       Relevant Orders   Brain natriuretic peptide (Completed)   Chronic fatigue   Chronic problem contributing to overall decline in health  Previously abnormal renal function Repeat RFP today  CBC showed no anemia but elevated WBC of 13.5 in the past       Relevant Orders   Brain natriuretic peptide (Completed)   Ambulatory referral to Home Health   CBC (Completed)   Artificial cardiac pacemaker   Chronic  Managed by cardiology with recent procedural visit on 12/20/22 Will request earlier follow up with Dr. Darrold Junker due to orthostatic hypotension and dizziness         Relevant Orders   Brain natriuretic peptide (Completed)   Ambulatory referral to Home Health   Other Visit Diagnoses       Impaired mobility and ADLs       Relevant Orders   Brain natriuretic peptide (Completed)   Ambulatory referral to Home Health   CT ABDOMEN PELVIS WO CONTRAST     Leg swelling       Relevant Orders   Brain natriuretic peptide (Completed)   Renal Function Panel (Completed)   CT ABDOMEN PELVIS WO CONTRAST     Localized edema       Relevant Orders   Brain natriuretic peptide (Completed)     Elevated alkaline phosphatase level       Relevant Orders   CT ABDOMEN PELVIS WO CONTRAST        Chronic Kidney Disease? Abnormal kidney function levels noted. Previous kidney ultrasound in July showed no hydronephrosis but a cyst was noted. Discussed potential risks of untreated kidney disease, including progression to renal failure. - Order renal function panel - Order CT scan of abdomen and pelvis  Alcohol  Use Patient reports consuming two alcoholic drinks daily, which may contribute to dizziness, poor appetite, and overall health decline. Advised to reduce alcohol intake. Discussed risks of continued alcohol use, including exacerbation of current symptoms and potential liver damage. - Advised patient to not consume alcohol   General Health Maintenance Patient requires assistance with daily activities and safety concerns due to frequent falls and inability to perform self-care. Discussed potential benefits of home health services, including improved safety and support for daily activities. - Request home health services including nursing, physical therapy, occupational therapy, and home health aid    Total time spent on today's visit was , including both face-to-face time evaluating the patient and reviewing prior BFP and specialty notes, documenting in the chart, ordering imaging and labs, answering patient and significant other's questions, coordinating care with referral coordinator and home health.   Patient has significant decline in status of overall health and I am concerned for his safety while living alone and having worsened dizziness and inability to ambulate unassisted.    Return in about 1 month (around 01/29/2023).         [name], MD  [facility] [phone] (phone) [phone] (fax)  [facility]

## 2022-12-29 ENCOUNTER — Encounter: Payer: Self-pay | Admitting: Family Medicine

## 2022-12-29 ENCOUNTER — Ambulatory Visit: Payer: Medicare Other | Admitting: Family Medicine

## 2022-12-29 ENCOUNTER — Telehealth: Payer: Self-pay | Admitting: Family Medicine

## 2022-12-29 VITALS — BP 120/76 | HR 88 | Resp 16 | Ht 67.5 in | Wt 152.0 lb

## 2022-12-29 DIAGNOSIS — I495 Sick sinus syndrome: Secondary | ICD-10-CM

## 2022-12-29 DIAGNOSIS — R600 Localized edema: Secondary | ICD-10-CM

## 2022-12-29 DIAGNOSIS — R5382 Chronic fatigue, unspecified: Secondary | ICD-10-CM

## 2022-12-29 DIAGNOSIS — F339 Major depressive disorder, recurrent, unspecified: Secondary | ICD-10-CM

## 2022-12-29 DIAGNOSIS — R42 Dizziness and giddiness: Secondary | ICD-10-CM | POA: Diagnosis not present

## 2022-12-29 DIAGNOSIS — Z789 Other specified health status: Secondary | ICD-10-CM

## 2022-12-29 DIAGNOSIS — M7989 Other specified soft tissue disorders: Secondary | ICD-10-CM

## 2022-12-29 DIAGNOSIS — R634 Abnormal weight loss: Secondary | ICD-10-CM

## 2022-12-29 DIAGNOSIS — I82431 Acute embolism and thrombosis of right popliteal vein: Secondary | ICD-10-CM | POA: Diagnosis not present

## 2022-12-29 DIAGNOSIS — E7800 Pure hypercholesterolemia, unspecified: Secondary | ICD-10-CM

## 2022-12-29 DIAGNOSIS — R748 Abnormal levels of other serum enzymes: Secondary | ICD-10-CM

## 2022-12-29 DIAGNOSIS — R627 Adult failure to thrive: Secondary | ICD-10-CM | POA: Diagnosis not present

## 2022-12-29 DIAGNOSIS — I10 Essential (primary) hypertension: Secondary | ICD-10-CM | POA: Diagnosis not present

## 2022-12-29 DIAGNOSIS — R262 Difficulty in walking, not elsewhere classified: Secondary | ICD-10-CM | POA: Diagnosis not present

## 2022-12-29 DIAGNOSIS — R296 Repeated falls: Secondary | ICD-10-CM | POA: Diagnosis not present

## 2022-12-29 DIAGNOSIS — R7989 Other specified abnormal findings of blood chemistry: Secondary | ICD-10-CM

## 2022-12-29 DIAGNOSIS — Z950 Presence of cardiac pacemaker: Secondary | ICD-10-CM | POA: Diagnosis not present

## 2022-12-29 DIAGNOSIS — Z7409 Other reduced mobility: Secondary | ICD-10-CM

## 2022-12-29 DIAGNOSIS — E876 Hypokalemia: Secondary | ICD-10-CM

## 2022-12-29 NOTE — Patient Instructions (Addendum)
  Please call to schedule imaging: [phone]    VISIT SUMMARY:  During today's visit, we discussed your ongoing symptoms of dizziness, nausea, and difficulty walking, which have been affecting your daily life. We also addressed your recent weight loss, poor appetite, and history of falls. Additionally, we reviewed your pacemaker function and alcohol consumption habits. A comprehensive plan was created to address these issues and improve your overall health.  YOUR PLAN:  -DIZZINESS AND ORTHOSTATIC HYPOTENSION: Your dizziness and nausea upon standing are likely due to orthostatic hypotension, which is a drop in blood pressure when you stand up. This can be caused by various factors including your medication and pacemaker. We will order a CT scan of your abdomen and pelvis, a renal function panel, a basic metabolic panel (BMP), and a B-type natriuretic peptide (BNP) test. We will also coordinate with your cardiologist and request home health services to assist you at home.  -WEIGHT LOSS AND POOR APPETITE: Your significant weight loss and poor appetite are concerning and may be due to an underlying condition. We will order a CT scan of your abdomen and pelvis and request home health services to help with meal preparation and monitoring your health.  -SICK SINUS SYNDROME WITH PACEMAKER: Your pacemaker is managing your sick sinus syndrome, but you have reported feeling skipped pulses. This may indicate an issue with the pacemaker. We will coordinate with your cardiologist to evaluate your pacemaker and ensure it is functioning properly.  -CHRONIC KIDNEY DISEASE: Your kidney function levels are abnormal, and a previous ultrasound showed a cyst on your kidney. We will order a renal function panel and a CT scan of your abdomen and pelvis to monitor your kidney health.  -ALCOHOL USE: Your daily consumption of two alcoholic drinks may be contributing to your symptoms. We advise you to limit your alcohol  intake to no more than seven drinks per week to help improve your overall health.  -GENERAL HEALTH MAINTENANCE: Given your frequent falls and difficulty with daily activities, we will request home health services including nursing, physical therapy, occupational therapy, and a home health aide to support you at home.  INSTRUCTIONS:  Please schedule a follow-up appointment in one month. You will also need to schedule a CT scan and brain scan; contact the provided number to arrange these appointments. We will ensure you are added to the designated contact list for better communication.

## 2022-12-29 NOTE — Telephone Encounter (Signed)
Please contact patient's cardiology office with request to schedule patient for evaluation of orthostatic hypotension in setting of  sick sinus syndrome

## 2022-12-30 DIAGNOSIS — R627 Adult failure to thrive: Secondary | ICD-10-CM | POA: Insufficient documentation

## 2022-12-30 LAB — CBC
Hematocrit: 38.3 % (ref 37.5–51.0)
Hemoglobin: 13.6 g/dL (ref 13.0–17.7)
MCH: 35.7 pg — ABNORMAL HIGH (ref 26.6–33.0)
MCHC: 35.5 g/dL (ref 31.5–35.7)
MCV: 101 fL — ABNORMAL HIGH (ref 79–97)
Platelets: 446 10*3/uL (ref 150–450)
RBC: 3.81 x10E6/uL — ABNORMAL LOW (ref 4.14–5.80)
RDW: 12.7 % (ref 11.6–15.4)
WBC: 9.7 10*3/uL (ref 3.4–10.8)

## 2022-12-30 LAB — BRAIN NATRIURETIC PEPTIDE: BNP: 99.2 pg/mL (ref 0.0–100.0)

## 2022-12-30 LAB — RENAL FUNCTION PANEL
Albumin: 3.5 g/dL — ABNORMAL LOW (ref 3.7–4.7)
BUN/Creatinine Ratio: 13 (ref 10–24)
BUN: 20 mg/dL (ref 8–27)
CO2: 28 mmol/L (ref 20–29)
Calcium: 8.9 mg/dL (ref 8.6–10.2)
Chloride: 96 mmol/L (ref 96–106)
Creatinine, Ser: 1.6 mg/dL — ABNORMAL HIGH (ref 0.76–1.27)
Glucose: 130 mg/dL — ABNORMAL HIGH (ref 70–99)
Phosphorus: 2.3 mg/dL — ABNORMAL LOW (ref 2.8–4.1)
Potassium: 3.2 mmol/L — ABNORMAL LOW (ref 3.5–5.2)
Sodium: 144 mmol/L (ref 134–144)
eGFR: 42 mL/min/{1.73_m2} — ABNORMAL LOW (ref >59)

## 2022-12-30 MED ORDER — POTASSIUM CHLORIDE CRYS ER 20 MEQ PO TBCR: 20.0000 meq | EXTENDED_RELEASE_TABLET | Freq: Two times a day (BID) | ORAL | 0 refills | Status: DC

## 2022-12-30 NOTE — Assessment & Plan Note (Signed)
Chronic  Within goal range today  Continue valsartan 80mg  daily  Continue metoprolol 25mg  daily

## 2022-12-30 NOTE — Assessment & Plan Note (Signed)
Chronic  Worsening given malaise, decreased appetite  Hesitant to make any medication changes to zoloft 50mg  due to patient's current status  Will continue current regimen for now and reassess after additional evaluations

## 2022-12-30 NOTE — Assessment & Plan Note (Signed)
Chronic problem contributing to overall decline in health  Previously abnormal renal function Repeat RFP today  CBC showed no anemia but elevated WBC of 13.5 in the past

## 2022-12-30 NOTE — Assessment & Plan Note (Signed)
Chronic  Worsening  Previously ordered brain MRI, pt has not been able to schedule this as of yet  Referral coordinator has not been able to contact patient to schedule imaging  Patient's designated party release updated to include his SO, Delsa Bern to assist with scheduling  Patient and Myriam Jacobson given contact information to return scheduling call

## 2022-12-30 NOTE — Assessment & Plan Note (Signed)
Chronic  Managed by cardiology with recent procedural visit on 12/20/22 Will request earlier follow up with Dr. Darrold Junker due to orthostatic hypotension and dizziness

## 2022-12-30 NOTE — Assessment & Plan Note (Signed)
Significant weight loss and poor appetite, with concerns about potential underlying causes such as malignancy or other systemic issues. Patient reports difficulty finding appetizing food and frequent nausea. Discussed potential outcomes of untreated weight loss, including further decline in health and need for assisted living. - Order CT scan of abdomen and pelvis - Request home health services for assistance with meal preparation and monitoring

## 2022-12-30 NOTE — Assessment & Plan Note (Signed)
Persistent dizziness and nausea upon standing, likely related to orthostatic hypotension. Possible contributing factors include blood pressure fluctuations, pacemaker function, and gabapentin side effects. Discussed risks of untreated orthostatic hypotension, including falls and injury. Patient prefers to avoid unnecessary hospital visits unless vitals are unstable. - Order CT scan of abdomen and pelvis, cancel RUQ [place] and renal US previously ordered  - Order renal function panel to follow up on elevated creatinine of 1.63 - Order RFP - Order BNP (to evaluate for possible HF given edema, weight loss, dizziness) - Contact cardiology office to request next available appt for orthostatic hypotension and dizziness as well as any other cardiac testing recommendations given recent decline of overall health status  - Request home health services including nursing, physical therapy, occupational therapy, and home health aid due to patient's inability to ambulate safely, decreased oral intake, fatigue, inability to complete ADLs and living alone

## 2022-12-30 NOTE — Assessment & Plan Note (Signed)
Repeat RFP today

## 2022-12-31 DIAGNOSIS — M1990 Unspecified osteoarthritis, unspecified site: Secondary | ICD-10-CM | POA: Diagnosis not present

## 2022-12-31 DIAGNOSIS — I951 Orthostatic hypotension: Secondary | ICD-10-CM | POA: Diagnosis not present

## 2022-12-31 DIAGNOSIS — I495 Sick sinus syndrome: Secondary | ICD-10-CM | POA: Diagnosis not present

## 2022-12-31 DIAGNOSIS — E7800 Pure hypercholesterolemia, unspecified: Secondary | ICD-10-CM | POA: Diagnosis not present

## 2022-12-31 DIAGNOSIS — R42 Dizziness and giddiness: Secondary | ICD-10-CM | POA: Diagnosis not present

## 2022-12-31 DIAGNOSIS — K219 Gastro-esophageal reflux disease without esophagitis: Secondary | ICD-10-CM | POA: Diagnosis not present

## 2022-12-31 DIAGNOSIS — G4730 Sleep apnea, unspecified: Secondary | ICD-10-CM | POA: Diagnosis not present

## 2022-12-31 DIAGNOSIS — I4510 Unspecified right bundle-branch block: Secondary | ICD-10-CM | POA: Diagnosis not present

## 2022-12-31 DIAGNOSIS — H9190 Unspecified hearing loss, unspecified ear: Secondary | ICD-10-CM | POA: Diagnosis not present

## 2022-12-31 DIAGNOSIS — Z9181 History of falling: Secondary | ICD-10-CM | POA: Diagnosis not present

## 2022-12-31 DIAGNOSIS — Z950 Presence of cardiac pacemaker: Secondary | ICD-10-CM | POA: Diagnosis not present

## 2022-12-31 DIAGNOSIS — Z7901 Long term (current) use of anticoagulants: Secondary | ICD-10-CM | POA: Diagnosis not present

## 2022-12-31 DIAGNOSIS — I10 Essential (primary) hypertension: Secondary | ICD-10-CM | POA: Diagnosis not present

## 2022-12-31 DIAGNOSIS — Z86718 Personal history of other venous thrombosis and embolism: Secondary | ICD-10-CM | POA: Diagnosis not present

## 2022-12-31 DIAGNOSIS — K5790 Diverticulosis of intestine, part unspecified, without perforation or abscess without bleeding: Secondary | ICD-10-CM | POA: Diagnosis not present

## 2022-12-31 DIAGNOSIS — F339 Major depressive disorder, recurrent, unspecified: Secondary | ICD-10-CM | POA: Diagnosis not present

## 2023-01-02 NOTE — Telephone Encounter (Signed)
Attempted to call [facility] and received answering service because office is closed. Will attempt to call again later this week after Christmas holiday.

## 2023-01-03 DIAGNOSIS — I951 Orthostatic hypotension: Secondary | ICD-10-CM | POA: Diagnosis not present

## 2023-01-03 DIAGNOSIS — I4510 Unspecified right bundle-branch block: Secondary | ICD-10-CM | POA: Diagnosis not present

## 2023-01-03 DIAGNOSIS — R42 Dizziness and giddiness: Secondary | ICD-10-CM | POA: Diagnosis not present

## 2023-01-03 DIAGNOSIS — I10 Essential (primary) hypertension: Secondary | ICD-10-CM | POA: Diagnosis not present

## 2023-01-03 DIAGNOSIS — I495 Sick sinus syndrome: Secondary | ICD-10-CM | POA: Diagnosis not present

## 2023-01-03 DIAGNOSIS — F339 Major depressive disorder, recurrent, unspecified: Secondary | ICD-10-CM | POA: Diagnosis not present

## 2023-01-05 ENCOUNTER — Ambulatory Visit
Admission: RE | Admit: 2023-01-05 | Discharge: 2023-01-05 | Disposition: A | Discharge status: Home / Self Care | Payer: Medicare Other | Source: Ambulatory Visit | Attending: Family Medicine | Admitting: Family Medicine

## 2023-01-05 DIAGNOSIS — M7989 Other specified soft tissue disorders: Secondary | ICD-10-CM

## 2023-01-05 DIAGNOSIS — R7989 Other specified abnormal findings of blood chemistry: Secondary | ICD-10-CM

## 2023-01-05 DIAGNOSIS — K760 Fatty (change of) liver, not elsewhere classified: Secondary | ICD-10-CM | POA: Diagnosis not present

## 2023-01-05 DIAGNOSIS — Z7409 Other reduced mobility: Secondary | ICD-10-CM

## 2023-01-05 DIAGNOSIS — R634 Abnormal weight loss: Secondary | ICD-10-CM

## 2023-01-05 DIAGNOSIS — I700 Atherosclerosis of aorta: Secondary | ICD-10-CM | POA: Diagnosis not present

## 2023-01-05 DIAGNOSIS — R748 Abnormal levels of other serum enzymes: Secondary | ICD-10-CM

## 2023-01-06 DIAGNOSIS — F339 Major depressive disorder, recurrent, unspecified: Secondary | ICD-10-CM | POA: Diagnosis not present

## 2023-01-06 DIAGNOSIS — I495 Sick sinus syndrome: Secondary | ICD-10-CM | POA: Diagnosis not present

## 2023-01-06 DIAGNOSIS — I951 Orthostatic hypotension: Secondary | ICD-10-CM | POA: Diagnosis not present

## 2023-01-06 DIAGNOSIS — I4510 Unspecified right bundle-branch block: Secondary | ICD-10-CM | POA: Diagnosis not present

## 2023-01-06 DIAGNOSIS — R42 Dizziness and giddiness: Secondary | ICD-10-CM | POA: Diagnosis not present

## 2023-01-06 DIAGNOSIS — I10 Essential (primary) hypertension: Secondary | ICD-10-CM | POA: Diagnosis not present

## 2023-01-09 ENCOUNTER — Telehealth: Payer: Self-pay

## 2023-01-09 DIAGNOSIS — I10 Essential (primary) hypertension: Secondary | ICD-10-CM | POA: Diagnosis not present

## 2023-01-09 DIAGNOSIS — I951 Orthostatic hypotension: Secondary | ICD-10-CM | POA: Diagnosis not present

## 2023-01-09 DIAGNOSIS — I495 Sick sinus syndrome: Secondary | ICD-10-CM | POA: Diagnosis not present

## 2023-01-09 DIAGNOSIS — I4510 Unspecified right bundle-branch block: Secondary | ICD-10-CM | POA: Diagnosis not present

## 2023-01-09 DIAGNOSIS — R42 Dizziness and giddiness: Secondary | ICD-10-CM | POA: Diagnosis not present

## 2023-01-09 DIAGNOSIS — F339 Major depressive disorder, recurrent, unspecified: Secondary | ICD-10-CM | POA: Diagnosis not present

## 2023-01-09 NOTE — Telephone Encounter (Signed)
Mailbox full unable to leave voicemail. CRM created. Ok for [facility] per Dr.B

## 2023-01-09 NOTE — Telephone Encounter (Signed)
OK for verbals

## 2023-01-10 DIAGNOSIS — F339 Major depressive disorder, recurrent, unspecified: Secondary | ICD-10-CM | POA: Diagnosis not present

## 2023-01-10 DIAGNOSIS — R42 Dizziness and giddiness: Secondary | ICD-10-CM | POA: Diagnosis not present

## 2023-01-10 DIAGNOSIS — I10 Essential (primary) hypertension: Secondary | ICD-10-CM | POA: Diagnosis not present

## 2023-01-10 DIAGNOSIS — I951 Orthostatic hypotension: Secondary | ICD-10-CM | POA: Diagnosis not present

## 2023-01-10 DIAGNOSIS — I495 Sick sinus syndrome: Secondary | ICD-10-CM | POA: Diagnosis not present

## 2023-01-10 DIAGNOSIS — I4510 Unspecified right bundle-branch block: Secondary | ICD-10-CM | POA: Diagnosis not present

## 2023-01-12 DIAGNOSIS — I951 Orthostatic hypotension: Secondary | ICD-10-CM | POA: Diagnosis not present

## 2023-01-12 DIAGNOSIS — R42 Dizziness and giddiness: Secondary | ICD-10-CM | POA: Diagnosis not present

## 2023-01-12 DIAGNOSIS — I495 Sick sinus syndrome: Secondary | ICD-10-CM | POA: Diagnosis not present

## 2023-01-12 DIAGNOSIS — I10 Essential (primary) hypertension: Secondary | ICD-10-CM | POA: Diagnosis not present

## 2023-01-12 DIAGNOSIS — I4510 Unspecified right bundle-branch block: Secondary | ICD-10-CM | POA: Diagnosis not present

## 2023-01-12 DIAGNOSIS — F339 Major depressive disorder, recurrent, unspecified: Secondary | ICD-10-CM | POA: Diagnosis not present

## 2023-01-13 NOTE — Telephone Encounter (Signed)
 Mailbox full unable to leave voicemail. CRM created. Ok for [facility] per Dr.B   Have not been able to provide orders. Just fyi

## 2023-01-16 ENCOUNTER — Other Ambulatory Visit: Payer: Self-pay | Admitting: Family Medicine

## 2023-01-18 ENCOUNTER — Other Ambulatory Visit: Payer: Self-pay | Admitting: Family Medicine

## 2023-01-18 DIAGNOSIS — I4510 Unspecified right bundle-branch block: Secondary | ICD-10-CM | POA: Diagnosis not present

## 2023-01-18 DIAGNOSIS — I495 Sick sinus syndrome: Secondary | ICD-10-CM | POA: Diagnosis not present

## 2023-01-18 DIAGNOSIS — R42 Dizziness and giddiness: Secondary | ICD-10-CM | POA: Diagnosis not present

## 2023-01-18 DIAGNOSIS — I951 Orthostatic hypotension: Secondary | ICD-10-CM | POA: Diagnosis not present

## 2023-01-18 DIAGNOSIS — I10 Essential (primary) hypertension: Secondary | ICD-10-CM | POA: Diagnosis not present

## 2023-01-18 DIAGNOSIS — F339 Major depressive disorder, recurrent, unspecified: Secondary | ICD-10-CM | POA: Diagnosis not present

## 2023-01-18 NOTE — Telephone Encounter (Signed)
 [facility] --scheduled pt 01/19/22-cardiology will call the pt back for the time.

## 2023-01-19 DIAGNOSIS — I10 Essential (primary) hypertension: Secondary | ICD-10-CM | POA: Diagnosis not present

## 2023-01-19 DIAGNOSIS — I951 Orthostatic hypotension: Secondary | ICD-10-CM | POA: Diagnosis not present

## 2023-01-19 DIAGNOSIS — R42 Dizziness and giddiness: Secondary | ICD-10-CM | POA: Diagnosis not present

## 2023-01-19 DIAGNOSIS — F339 Major depressive disorder, recurrent, unspecified: Secondary | ICD-10-CM | POA: Diagnosis not present

## 2023-01-19 DIAGNOSIS — I4510 Unspecified right bundle-branch block: Secondary | ICD-10-CM | POA: Diagnosis not present

## 2023-01-19 DIAGNOSIS — I495 Sick sinus syndrome: Secondary | ICD-10-CM | POA: Diagnosis not present

## 2023-01-25 DIAGNOSIS — I10 Essential (primary) hypertension: Secondary | ICD-10-CM | POA: Diagnosis not present

## 2023-01-25 DIAGNOSIS — I495 Sick sinus syndrome: Secondary | ICD-10-CM | POA: Diagnosis not present

## 2023-01-25 DIAGNOSIS — I4510 Unspecified right bundle-branch block: Secondary | ICD-10-CM | POA: Diagnosis not present

## 2023-01-25 DIAGNOSIS — F339 Major depressive disorder, recurrent, unspecified: Secondary | ICD-10-CM | POA: Diagnosis not present

## 2023-01-25 DIAGNOSIS — I951 Orthostatic hypotension: Secondary | ICD-10-CM | POA: Diagnosis not present

## 2023-01-25 DIAGNOSIS — R42 Dizziness and giddiness: Secondary | ICD-10-CM | POA: Diagnosis not present

## 2023-01-27 ENCOUNTER — Telehealth: Payer: Self-pay | Admitting: Family Medicine

## 2023-01-27 ENCOUNTER — Other Ambulatory Visit: Payer: Self-pay | Admitting: Family Medicine

## 2023-01-27 DIAGNOSIS — H9190 Unspecified hearing loss, unspecified ear: Secondary | ICD-10-CM | POA: Diagnosis not present

## 2023-01-27 DIAGNOSIS — G4730 Sleep apnea, unspecified: Secondary | ICD-10-CM | POA: Diagnosis not present

## 2023-01-27 DIAGNOSIS — K219 Gastro-esophageal reflux disease without esophagitis: Secondary | ICD-10-CM | POA: Diagnosis not present

## 2023-01-27 DIAGNOSIS — I495 Sick sinus syndrome: Secondary | ICD-10-CM | POA: Diagnosis not present

## 2023-01-27 DIAGNOSIS — F339 Major depressive disorder, recurrent, unspecified: Secondary | ICD-10-CM | POA: Diagnosis not present

## 2023-01-27 DIAGNOSIS — I951 Orthostatic hypotension: Secondary | ICD-10-CM | POA: Diagnosis not present

## 2023-01-27 DIAGNOSIS — M1990 Unspecified osteoarthritis, unspecified site: Secondary | ICD-10-CM | POA: Diagnosis not present

## 2023-01-27 DIAGNOSIS — E7800 Pure hypercholesterolemia, unspecified: Secondary | ICD-10-CM | POA: Diagnosis not present

## 2023-01-27 DIAGNOSIS — E876 Hypokalemia: Secondary | ICD-10-CM

## 2023-01-27 DIAGNOSIS — K5790 Diverticulosis of intestine, part unspecified, without perforation or abscess without bleeding: Secondary | ICD-10-CM | POA: Diagnosis not present

## 2023-01-27 DIAGNOSIS — I10 Essential (primary) hypertension: Secondary | ICD-10-CM | POA: Diagnosis not present

## 2023-01-27 DIAGNOSIS — I4510 Unspecified right bundle-branch block: Secondary | ICD-10-CM | POA: Diagnosis not present

## 2023-01-27 DIAGNOSIS — R42 Dizziness and giddiness: Secondary | ICD-10-CM | POA: Diagnosis not present

## 2023-01-27 NOTE — Telephone Encounter (Signed)
Certification paperwork received via fax today from [facility] home health. Front office portion completed and entered in mailbox

## 2023-01-30 ENCOUNTER — Telehealth: Payer: Self-pay | Admitting: Family Medicine

## 2023-01-30 DIAGNOSIS — E7800 Pure hypercholesterolemia, unspecified: Secondary | ICD-10-CM | POA: Diagnosis not present

## 2023-01-30 DIAGNOSIS — K219 Gastro-esophageal reflux disease without esophagitis: Secondary | ICD-10-CM | POA: Diagnosis not present

## 2023-01-30 DIAGNOSIS — I951 Orthostatic hypotension: Secondary | ICD-10-CM | POA: Diagnosis not present

## 2023-01-30 DIAGNOSIS — Z950 Presence of cardiac pacemaker: Secondary | ICD-10-CM | POA: Diagnosis not present

## 2023-01-30 DIAGNOSIS — I495 Sick sinus syndrome: Secondary | ICD-10-CM | POA: Diagnosis not present

## 2023-01-30 DIAGNOSIS — F339 Major depressive disorder, recurrent, unspecified: Secondary | ICD-10-CM | POA: Diagnosis not present

## 2023-01-30 DIAGNOSIS — I4510 Unspecified right bundle-branch block: Secondary | ICD-10-CM | POA: Diagnosis not present

## 2023-01-30 DIAGNOSIS — Z9181 History of falling: Secondary | ICD-10-CM | POA: Diagnosis not present

## 2023-01-30 DIAGNOSIS — Z7901 Long term (current) use of anticoagulants: Secondary | ICD-10-CM | POA: Diagnosis not present

## 2023-01-30 DIAGNOSIS — Z86718 Personal history of other venous thrombosis and embolism: Secondary | ICD-10-CM | POA: Diagnosis not present

## 2023-01-30 DIAGNOSIS — R42 Dizziness and giddiness: Secondary | ICD-10-CM | POA: Diagnosis not present

## 2023-01-30 DIAGNOSIS — M1990 Unspecified osteoarthritis, unspecified site: Secondary | ICD-10-CM | POA: Diagnosis not present

## 2023-01-30 DIAGNOSIS — H9190 Unspecified hearing loss, unspecified ear: Secondary | ICD-10-CM | POA: Diagnosis not present

## 2023-01-30 DIAGNOSIS — G4730 Sleep apnea, unspecified: Secondary | ICD-10-CM | POA: Diagnosis not present

## 2023-01-30 DIAGNOSIS — K5790 Diverticulosis of intestine, part unspecified, without perforation or abscess without bleeding: Secondary | ICD-10-CM | POA: Diagnosis not present

## 2023-01-30 DIAGNOSIS — I10 Essential (primary) hypertension: Secondary | ICD-10-CM | POA: Diagnosis not present

## 2023-01-30 NOTE — Telephone Encounter (Signed)
HHA certification form faxed back to home health 01/30/23 VM

## 2023-01-30 NOTE — Telephone Encounter (Signed)
HHA certification form received via fax today and front office portion completed and placed in provider mailbox VM

## 2023-01-31 ENCOUNTER — Ambulatory Visit: Payer: Medicare Other | Admitting: Family Medicine

## 2023-01-31 NOTE — Progress Notes (Deleted)
      Established patient visit   Patient: Wesley Small   DOB: 1935-06-29   88 y.o. Male  MRN: [id number] Visit Date: 01/31/2023  Today's healthcare provider: [name], MD   No chief complaint on file.  Subjective       Discussed the use of AI scribe software for clinical note transcription with the patient, who gave verbal consent to proceed.  History of Present Illness          Signed certification forms for home health  Has cardiology follow up scheduled for 02/14/23 and neurology 02/06/23  CT 01/05/23: results concerning for 1cm nodularity possibly related to enlarged lymph node, MRI recommended if hx of malignancy, given overall picture of decline in health, recommend proceeding with imaging***    Past Medical History:  Diagnosis Date   Arthritis    Dysrhythmia    RBBB   GERD (gastroesophageal reflux disease)    History of blood transfusion    History of diverticulitis    History of hiatal hernia    HOH (hard of hearing)    Hypercholesterolemia    Hypertension    Presence of permanent cardiac pacemaker    Sleep apnea     Medications: Outpatient Medications Prior to Visit  Medication Sig   ELIQUIS 5 MG TABS tablet TAKE ONE TABLET BY MOUTH TWO TMES DAILY   gabapentin (NEURONTIN) 100 MG capsule    metoprolol succinate (TOPROL-XL) 25 MG 24 hr tablet Take 1 tablet (25 mg total) by mouth daily.   potassium chloride SA (KLOR-CON M) 20 MEQ tablet Take 1 tablet (20 mEq total) by mouth 2 (two) times daily.   sertraline (ZOLOFT) 50 MG tablet TAKE ONE TABLET (50 MG TOTAL) BY MOUTH DAILY.   traMADol (ULTRAM) 50 MG tablet TAKE ONE TABLET BY MOUTH TWICE A DAY AS NEEDED FOR PAIN   traMADol (ULTRAM) 50 MG tablet TAKE ONE TABLET BY MOUTH TWICE A DAY AS NEEDED FOR PAIN   traMADol (ULTRAM) 50 MG tablet TAKE ONE TABLET BY MOUTH TWICE A DAY AS NEEDED FOR PAIN   valsartan (DIOVAN) 160 MG tablet Take 0.5 tablets (80 mg total) by mouth daily.   zolpidem (AMBIEN)  5 MG tablet TAKE ONE TABLET (5 MG TOTAL) BY MOUTH AT BEDTIME AS NEEDED FOR SLEEP.   No facility-administered medications prior to visit.    Review of Systems  {Insert previous labs (optional):23779} {See past labs  Heme  Chem  Endocrine  Serology  Results Review (optional):1}   Objective    There were no vitals taken for this visit. {Insert last BP/Wt (optional):23777}{See vitals history (optional):1}    Physical Exam  ***  No results found for any visits on 01/31/23.  Assessment & Plan     Problem List Items Addressed This Visit   None   Assessment and Plan              No follow-ups on file.         [name], MD  [facility] [phone] (phone) [phone] (fax)  [facility]

## 2023-02-01 NOTE — Progress Notes (Unsigned)
      Established patient visit   Patient: Wesley Small   DOB: 09-Jul-1935   88 y.o. Male  MRN: [id number] Visit Date: 02/02/2023  Today's healthcare provider: [name], MD   No chief complaint on file.  Subjective       Discussed the use of AI scribe software for clinical note transcription with the patient, who gave verbal consent to proceed.  History of Present Illness          Signed certification forms for home health  Has cardiology follow up scheduled for 02/14/23 and neurology 02/06/23  CT 01/05/23: results concerning for 1cm nodularity possibly related to enlarged lymph node, MRI recommended if hx of malignancy, given overall picture of decline in health, recommend proceeding with imaging***    Past Medical History:  Diagnosis Date  . Arthritis   . Dysrhythmia    RBBB  . GERD (gastroesophageal reflux disease)   . History of blood transfusion   . History of diverticulitis   . History of hiatal hernia   . HOH (hard of hearing)   . Hypercholesterolemia   . Hypertension   . Presence of permanent cardiac pacemaker   . Sleep apnea     Medications: Outpatient Medications Prior to Visit  Medication Sig  . ELIQUIS 5 MG TABS tablet TAKE ONE TABLET BY MOUTH TWO TMES DAILY  . gabapentin (NEURONTIN) 100 MG capsule   . metoprolol succinate (TOPROL-XL) 25 MG 24 hr tablet Take 1 tablet (25 mg total) by mouth daily.  . potassium chloride SA (KLOR-CON M) 20 MEQ tablet Take 1 tablet (20 mEq total) by mouth 2 (two) times daily.  . sertraline (ZOLOFT) 50 MG tablet TAKE ONE TABLET (50 MG TOTAL) BY MOUTH DAILY.  . traMADol (ULTRAM) 50 MG tablet TAKE ONE TABLET BY MOUTH TWICE A DAY AS NEEDED FOR PAIN  . traMADol (ULTRAM) 50 MG tablet TAKE ONE TABLET BY MOUTH TWICE A DAY AS NEEDED FOR PAIN  . traMADol (ULTRAM) 50 MG tablet TAKE ONE TABLET BY MOUTH TWICE A DAY AS NEEDED FOR PAIN  . valsartan (DIOVAN) 160 MG tablet Take 0.5 tablets (80 mg total) by mouth daily.   Marland Kitchen zolpidem (AMBIEN) 5 MG tablet TAKE ONE TABLET (5 MG TOTAL) BY MOUTH AT BEDTIME AS NEEDED FOR SLEEP.   No facility-administered medications prior to visit.    Review of Systems  {Insert previous labs (optional):23779} {See past labs  Heme  Chem  Endocrine  Serology  Results Review (optional):1}   Objective    There were no vitals taken for this visit. {Insert last BP/Wt (optional):23777}{See vitals history (optional):1}    Physical Exam  ***  No results found for any visits on 02/02/23.  Assessment & Plan     Problem List Items Addressed This Visit   None   Assessment and Plan              No follow-ups on file.         [name], MD  [facility] [phone] (phone) [phone] (fax)  [facility]

## 2023-02-02 ENCOUNTER — Ambulatory Visit (INDEPENDENT_AMBULATORY_CARE_PROVIDER_SITE_OTHER): Payer: Medicare Other | Admitting: Family Medicine

## 2023-02-02 ENCOUNTER — Encounter: Payer: Self-pay | Admitting: Family Medicine

## 2023-02-02 VITALS — BP 144/86 | HR 99 | Ht 67.5 in | Wt 148.0 lb

## 2023-02-02 DIAGNOSIS — G4700 Insomnia, unspecified: Secondary | ICD-10-CM | POA: Diagnosis not present

## 2023-02-02 DIAGNOSIS — C61 Malignant neoplasm of prostate: Secondary | ICD-10-CM

## 2023-02-02 DIAGNOSIS — M170 Bilateral primary osteoarthritis of knee: Secondary | ICD-10-CM

## 2023-02-02 DIAGNOSIS — R296 Repeated falls: Secondary | ICD-10-CM

## 2023-02-02 DIAGNOSIS — I10 Essential (primary) hypertension: Secondary | ICD-10-CM | POA: Diagnosis not present

## 2023-02-02 DIAGNOSIS — R634 Abnormal weight loss: Secondary | ICD-10-CM | POA: Diagnosis not present

## 2023-02-02 DIAGNOSIS — R7989 Other specified abnormal findings of blood chemistry: Secondary | ICD-10-CM

## 2023-02-02 DIAGNOSIS — E875 Hyperkalemia: Secondary | ICD-10-CM

## 2023-02-02 DIAGNOSIS — R42 Dizziness and giddiness: Secondary | ICD-10-CM

## 2023-02-02 DIAGNOSIS — Z8546 Personal history of malignant neoplasm of prostate: Secondary | ICD-10-CM

## 2023-02-02 DIAGNOSIS — R599 Enlarged lymph nodes, unspecified: Secondary | ICD-10-CM

## 2023-02-02 MED ORDER — ZOLPIDEM TARTRATE 5 MG PO TABS: 10.0000 mg | ORAL_TABLET | Freq: Every day | ORAL | 0 refills | Status: DC

## 2023-02-02 MED ORDER — TRAMADOL HCL 50 MG PO TABS: ORAL_TABLET | ORAL | 0 refills | Status: DC

## 2023-02-02 NOTE — Assessment & Plan Note (Signed)
Persistent difficulty sleeping. Current half dose of Ambien (5 mg) is ineffective. Concerns about dizziness and fall risk with higher doses. Discussed increasing Ambien to 10 mg nightly and monitoring for adverse effects. Explained potential risks and benefits of higher doses and need to revert to 5 mg or discontinue if adverse effects occur. - Increase Ambien to 10 mg nightly - Monitor for dizziness and falls - Revert to 5 mg or discontinue if adverse effects occur

## 2023-02-02 NOTE — Assessment & Plan Note (Signed)
1 cm density in the inferior polar right pelvis concerning for enlarged lymph node. MRI recommended but complicated by pacemaker. Discussed alternative imaging options and potential biopsy if imaging is inconclusive. Explained MRI risks with pacemaker and need for further evaluation to rule out metastatic disease. - Consult with radiologist for alternative imaging options (see note below)  - recommend urology evaluation given hx of prostate CA - Consider biopsy if imaging is inconclusive

## 2023-02-02 NOTE — Patient Instructions (Signed)
VISIT SUMMARY:  During today's visit, we discussed your ongoing health concerns, including weight loss, elevated blood pressure, sleep disturbances, knee pain, and a recent finding of a potential enlarged lymph node in the pelvis. We reviewed your current medications and made adjustments where necessary. Your condition has shown some improvement, particularly with fewer dizzy spells and an increase in appetite, but there are still areas that need close monitoring and management.  YOUR PLAN:  -FAILURE TO THRIVE: Failure to thrive refers to a significant and unintentional weight loss. Your weight has decreased to 144 lbs from 150 lbs, but your appetite has improved. We discussed eating high-calorie, nutrient-dense foods and considering protein supplements to help you gain weight. Please continue cooking and eating regularly, and we will monitor your weight closely.  -HYPERTENSION: Hypertension is high blood pressure. Your blood pressure today was 144/86 mmHg. You have stopped taking your blood pressure medication due to normal readings and side effects. We will monitor your blood pressure daily, and you may need to restart medication if your readings exceed 160/100 mmHg.  -PELVIC LYMPHADENOPATHY: Pelvic lymphadenopathy is the enlargement of lymph nodes in the pelvic area. A recent scan showed a 1 cm density in your pelvis. We discussed alternative imaging options due to your pacemaker and the possibility of a biopsy if imaging is inconclusive. Further evaluation is needed to rule out metastatic disease.  -HEPATIC STEATOSIS: Hepatic steatosis is the accumulation of fat in the liver. We will monitor your liver function tests to assess the progression of this condition.  -AORTIC SCLEROSIS: Aortic sclerosis is the buildup of plaque in the aorta. You are currently on medications to manage this condition and reduce cardiovascular risk. Please continue taking your current medications.  -CHRONIC KIDNEY  DISEASE: Chronic kidney disease is a long-term condition where the kidneys do not work as well as they should. Your kidney function tests showed elevated creatinine levels. We will check your kidney function tests today and continue to monitor them regularly.  -INSOMNIA: Insomnia is difficulty sleeping. You have been taking a half dose of Ambien, which has been ineffective. We discussed increasing the dose to 10 mg nightly and monitoring for any adverse effects. If you experience dizziness or falls, we may need to revert to the lower dose or discontinue the medication.  -KNEE PAIN: Chronic knee pain can limit mobility and daily activities. You have been receiving cortisone injections every six months, which provide relief. We will continue with these injections and refill your tramadol prescription for pain management.  -GENERAL HEALTH MAINTENANCE: We discussed routine health maintenance, including checking labs today for kidney function, ALP, and bilirubin. We also encourage you to eat high-calorie, nutrient-dense foods to support weight gain.

## 2023-02-02 NOTE — Assessment & Plan Note (Signed)
Chronic  Continues to have intermittent pain that is somewhat relieved with tramadol 50mg  BID  Continue tramadol prescription  Recommended he follow up as scheduled with orthopedic doctor for steroid injections in his knees as patient reports significant relief with these injections

## 2023-02-02 NOTE — Assessment & Plan Note (Signed)
Chronic weight loss with recent improvement in appetite and cooking habits. Current weight is 144 lbs, down from 150 lbs last visit in DEC. BMI within normal range but concerning steady decline. Discussed high-calorie, nutrient-dense foods and protein supplements to support weight gain. - Encourage continued eating and cooking - Consider protein drinks and bars - Monitor weight closely

## 2023-02-02 NOTE — Assessment & Plan Note (Signed)
Subacute lab findings  Will repeat BMP today to check creatinine  Patient has been eating and drinking better

## 2023-02-02 NOTE — Assessment & Plan Note (Signed)
Improved after holding both metoprolol and valsartan  Continue to hold meds and monitor BP  Recommended reassess if BP elevated >150/90 consistently  Recommended monitoring BP daily

## 2023-02-02 NOTE — Assessment & Plan Note (Signed)
Hypertension Blood pressure today is 144/86 mmHg. Stopped antihypertensive medications due to previous normal readings and side effects. Current readings acceptable given previous fluctuations. Discussed regular monitoring and potential need to restart medication if readings exceed 160/100 mmHg. - Monitor blood pressure daily - Hold off on valsartan - Reassess if blood pressure consistently exceeds 160/100 mmHg - f/u with cardiology as scheduled

## 2023-02-03 ENCOUNTER — Encounter: Payer: Self-pay | Admitting: Family Medicine

## 2023-02-03 DIAGNOSIS — M170 Bilateral primary osteoarthritis of knee: Secondary | ICD-10-CM | POA: Diagnosis not present

## 2023-02-03 LAB — CMP14+EGFR
ALT: 25 [IU]/L (ref 0–44)
AST: 33 [IU]/L (ref 0–40)
Albumin: 4 g/dL (ref 3.7–4.7)
Alkaline Phosphatase: 105 [IU]/L (ref 44–121)
BUN/Creatinine Ratio: 14 (ref 10–24)
BUN: 24 mg/dL (ref 8–27)
Bilirubin Total: 1 mg/dL (ref 0.0–1.2)
CO2: 21 mmol/L (ref 20–29)
Calcium: 9.8 mg/dL (ref 8.6–10.2)
Chloride: 104 mmol/L (ref 96–106)
Creatinine, Ser: 1.7 mg/dL — ABNORMAL HIGH (ref 0.76–1.27)
Globulin, Total: 2.2 g/dL (ref 1.5–4.5)
Glucose: 107 mg/dL — ABNORMAL HIGH (ref 70–99)
Potassium: 5.5 mmol/L — ABNORMAL HIGH (ref 3.5–5.2)
Sodium: 142 mmol/L (ref 134–144)
Total Protein: 6.2 g/dL (ref 6.0–8.5)
eGFR: 39 mL/min/{1.73_m2} — ABNORMAL LOW (ref >59)

## 2023-02-03 MED ORDER — LOKELMA 5 G PO PACK: 10.0000 g | PACK | Freq: Every day | ORAL | 0 refills | Status: AC

## 2023-02-03 NOTE — Addendum Note (Signed)
Addended by: Bing Neighbors on: 02/03/2023 07:32 AM   Modules accepted: Orders

## 2023-02-06 DIAGNOSIS — R202 Paresthesia of skin: Secondary | ICD-10-CM | POA: Diagnosis not present

## 2023-02-06 DIAGNOSIS — R251 Tremor, unspecified: Secondary | ICD-10-CM | POA: Diagnosis not present

## 2023-02-06 DIAGNOSIS — G629 Polyneuropathy, unspecified: Secondary | ICD-10-CM | POA: Diagnosis not present

## 2023-02-06 DIAGNOSIS — R200 Anesthesia of skin: Secondary | ICD-10-CM | POA: Diagnosis not present

## 2023-02-15 ENCOUNTER — Ambulatory Visit: Payer: Self-pay

## 2023-02-15 DIAGNOSIS — I10 Essential (primary) hypertension: Secondary | ICD-10-CM | POA: Diagnosis not present

## 2023-02-15 DIAGNOSIS — I495 Sick sinus syndrome: Secondary | ICD-10-CM | POA: Diagnosis not present

## 2023-02-15 DIAGNOSIS — I951 Orthostatic hypotension: Secondary | ICD-10-CM | POA: Diagnosis not present

## 2023-02-15 DIAGNOSIS — F339 Major depressive disorder, recurrent, unspecified: Secondary | ICD-10-CM | POA: Diagnosis not present

## 2023-02-15 DIAGNOSIS — R42 Dizziness and giddiness: Secondary | ICD-10-CM | POA: Diagnosis not present

## 2023-02-15 DIAGNOSIS — I4510 Unspecified right bundle-branch block: Secondary | ICD-10-CM | POA: Diagnosis not present

## 2023-02-15 NOTE — Telephone Encounter (Signed)
Patient called, left VM to return the call to the office to speak to the NT.   

## 2023-02-15 NOTE — Telephone Encounter (Signed)
 Fall  He couldn't do  kitchen fall yesterday unsure  may have losrt balance Left shoulder sore  Next week last week  Feels he doesn't need to home therapy  Called pt and LM on VM to CB.

## 2023-02-16 NOTE — Telephone Encounter (Signed)
 I am able to review parts of documentation regarding a fall but not able to review the full details.  If patient has fallen multiple times, I would recommend ED evaluation and imaging ASAP

## 2023-02-16 NOTE — Telephone Encounter (Signed)
 Attempted to call patient- no answer- left message to call office- calling to check on him after fall.

## 2023-02-16 NOTE — Telephone Encounter (Signed)
 LMTCB-Ok for [facility] to let patient know provider's message and see how patient is doing if he calls back.

## 2023-03-13 ENCOUNTER — Other Ambulatory Visit: Payer: Self-pay | Admitting: Family Medicine

## 2023-03-13 DIAGNOSIS — I129 Hypertensive chronic kidney disease with stage 1 through stage 4 chronic kidney disease, or unspecified chronic kidney disease: Secondary | ICD-10-CM | POA: Diagnosis not present

## 2023-03-13 DIAGNOSIS — E875 Hyperkalemia: Secondary | ICD-10-CM | POA: Diagnosis not present

## 2023-03-13 DIAGNOSIS — E785 Hyperlipidemia, unspecified: Secondary | ICD-10-CM | POA: Diagnosis not present

## 2023-03-13 DIAGNOSIS — M170 Bilateral primary osteoarthritis of knee: Secondary | ICD-10-CM

## 2023-03-13 DIAGNOSIS — N1832 Chronic kidney disease, stage 3b: Secondary | ICD-10-CM | POA: Diagnosis not present

## 2023-03-13 DIAGNOSIS — R8290 Unspecified abnormal findings in urine: Secondary | ICD-10-CM | POA: Diagnosis not present

## 2023-03-14 DIAGNOSIS — R002 Palpitations: Secondary | ICD-10-CM | POA: Diagnosis not present

## 2023-03-14 DIAGNOSIS — E782 Mixed hyperlipidemia: Secondary | ICD-10-CM | POA: Diagnosis not present

## 2023-03-14 DIAGNOSIS — I495 Sick sinus syndrome: Secondary | ICD-10-CM | POA: Diagnosis not present

## 2023-03-14 DIAGNOSIS — Z950 Presence of cardiac pacemaker: Secondary | ICD-10-CM | POA: Diagnosis not present

## 2023-03-14 DIAGNOSIS — I10 Essential (primary) hypertension: Secondary | ICD-10-CM | POA: Diagnosis not present

## 2023-03-14 NOTE — Telephone Encounter (Signed)
 Per the pharmacy      *Maximum MME cannot be calculated for this prescription. Enter discrete sig details to calculate maximum MME.   LOV 1*23*25 NOV 3*21*25 LRF 1*23*25 LABS 57*84*69

## 2023-03-21 DIAGNOSIS — I495 Sick sinus syndrome: Secondary | ICD-10-CM | POA: Diagnosis not present

## 2023-03-31 ENCOUNTER — Encounter: Payer: Self-pay | Admitting: Family Medicine

## 2023-03-31 ENCOUNTER — Ambulatory Visit: Payer: Medicare Other | Admitting: Family Medicine

## 2023-03-31 VITALS — BP 155/86 | HR 87 | Ht 67.0 in | Wt 164.0 lb

## 2023-03-31 DIAGNOSIS — R296 Repeated falls: Secondary | ICD-10-CM

## 2023-03-31 DIAGNOSIS — R2689 Other abnormalities of gait and mobility: Secondary | ICD-10-CM

## 2023-03-31 DIAGNOSIS — G4700 Insomnia, unspecified: Secondary | ICD-10-CM | POA: Diagnosis not present

## 2023-03-31 DIAGNOSIS — I10 Essential (primary) hypertension: Secondary | ICD-10-CM | POA: Diagnosis not present

## 2023-03-31 MED ORDER — VALSARTAN 40 MG PO TABS: 40.0000 mg | ORAL_TABLET | Freq: Every day | ORAL | 3 refills | Status: DC

## 2023-03-31 MED ORDER — ZOLPIDEM TARTRATE 5 MG PO TABS: 5.0000 mg | ORAL_TABLET | Freq: Every day | ORAL | 0 refills | Status: DC

## 2023-03-31 NOTE — Progress Notes (Signed)
 Established patient visit   Patient: Wesley Small   DOB: Jun 12, 1935   88 y.o. Male  MRN: [id number]  Visit Date: 03/31/2023  Today's healthcare provider: [name], MD   Chief Complaint  Patient presents with   Follow-up    Weight loss follow up, is now back on routine eating regularly and has gained weight back   Balance    He had a fall about a week ago didn't get checked, and a bruise on the L side of his face. He has no memory of when where or how the fall happened he just remember waking up in bed    Subjective     HPI     Follow-up    Additional comments: Weight loss follow up, is now back on routine eating regularly and has gained weight back        Balance    Additional comments: He had a fall about a week ago didn't get checked, and a bruise on the L side of his face. He has no memory of when where or how the fall happened he just remember waking up in bed       Last edited by Thedora Hinders, CMA on 03/31/2023  2:14 PM.       Discussed the use of AI scribe software for clinical note transcription with the patient, who gave verbal consent to proceed.  History of Present Illness Wesley Small is an 88 year old male who presents for a follow-up visit regarding his chronic conditions. He is accompanied by his significant other.  He has experienced multiple falls, primarily at night, with a total of six falls this year. He does not recall the details of the most recent fall, which resulted in a facial bruise. He is concerned about the potential link between his falls and the increased dose of Ambien, which was raised from 5 mg to 10 mg at night. He has been taking Ambien for over 25 years and is currently out of the medication. He consumes wine daily and has reduced his intake of hard liquor.  He has a history of unintentional weight loss, which has improved recently, with his weight increasing from 148 pounds to 164 pounds. He had  significant weight loss issues last year, particularly around June, due to diarrhea and loss of appetite, which persisted until around Christmas. He is now eating well and has regained his appetite.  He has a history of elevated blood pressure, with a recent reading of 155/86. He previously stopped taking blood pressure medication, including valsartan and metoprolol, due to normal readings and concerns about dizziness. He is currently not on valsartan.  He has chronic kidney disease and has been seen by a nephrologist. He has a pacemaker, which was placed to address previous issues with heart rhythm. He has experienced dizziness in the past, which has since resolved. He is on Eliquis for anticoagulation and has had a heart monitor placed recently, with a follow-up appointment scheduled with his cardiologist.  He reports knee pain, particularly in one knee, which affects his ability to walk long distances. He received cortisone injections in both knees about three weeks ago, which provided some relief. He is also on tramadol for pain management.   Neuro: started lyrica for neuropathy symptoms Card and Nephrology: continue ARB/ no NSAIDs, has CKD 3B, pacemaker interrogation   Insomnia increased ambien to 10mg  nightly at our last visit   Past Medical History:  Diagnosis  Date   Arthritis    Dysrhythmia    RBBB   GERD (gastroesophageal reflux disease)    History of blood transfusion    History of diverticulitis    History of hiatal hernia    HOH (hard of hearing)    Hypercholesterolemia    Hypertension    Presence of permanent cardiac pacemaker    Sleep apnea     Medications: Outpatient Medications Prior to Visit  Medication Sig   ELIQUIS 5 MG TABS tablet TAKE ONE TABLET BY MOUTH TWO TMES DAILY   sertraline (ZOLOFT) 50 MG tablet TAKE ONE TABLET (50 MG TOTAL) BY MOUTH DAILY.   [DISCONTINUED] valsartan (DIOVAN) 160 MG tablet Take 0.5 tablets (80 mg total) by mouth daily.   [DISCONTINUED]  zolpidem (AMBIEN) 5 MG tablet Take 2 tablets (10 mg total) by mouth at bedtime.   metoprolol succinate (TOPROL-XL) 25 MG 24 hr tablet Take 1 tablet (25 mg total) by mouth daily.   traMADol (ULTRAM) 50 MG tablet TAKE ONE TABLET BY MOUTH TWICE A DAY AS NEEDED FOR PAIN   [DISCONTINUED] potassium chloride SA (KLOR-CON M) 20 MEQ tablet Take 1 tablet (20 mEq total) by mouth 2 (two) times daily. (Patient not taking: Reported on 03/31/2023)   No facility-administered medications prior to visit.    Review of Systems  Last CBC Lab Results  Component Value Date   WBC 9.7 12/29/2022   HGB 13.6 12/29/2022   HCT 38.3 12/29/2022   MCV 101 (H) 12/29/2022   MCH 35.7 (H) 12/29/2022   RDW 12.7 12/29/2022   PLT 446 12/29/2022   Last metabolic panel Lab Results  Component Value Date   GLUCOSE 107 (H) 02/02/2023   NA 142 02/02/2023   K 5.5 (H) 02/02/2023   CL 104 02/02/2023   CO2 21 02/02/2023   BUN 24 02/02/2023   CREATININE 1.70 (H) 02/02/2023   EGFR 39 (L) 02/02/2023   CALCIUM 9.8 02/02/2023   PHOS 2.3 (L) 12/29/2022   PROT 6.2 02/02/2023   ALBUMIN 4.0 02/02/2023   LABGLOB 2.2 02/02/2023   AGRATIO 2.1 10/28/2021   BILITOT 1.0 02/02/2023   ALKPHOS 105 02/02/2023   AST 33 02/02/2023   ALT 25 02/02/2023   ANIONGAP 14 07/10/2022   Last lipids Lab Results  Component Value Date   CHOL 169 04/28/2021   HDL 46 04/28/2021   LDLCALC 81 04/28/2021   TRIG 257 (H) 04/28/2021   CHOLHDL 3.7 04/28/2021   Last hemoglobin A1c Lab Results  Component Value Date   HGBA1C 5.5 12/01/2022   Last thyroid functions Lab Results  Component Value Date   TSH 2.340 12/01/2022   Last vitamin D Lab Results  Component Value Date   VD25OH 36.5 07/19/2022   Last vitamin B12 and Folate Lab Results  Component Value Date   VITAMINB12 460 06/27/2022   FOLATE 8.1 06/27/2022        Objective    BP (!) 155/86   Pulse 87   Ht 5\' 7"  (1.702 m)   Wt 164 lb (74.4 kg)   SpO2 98%   BMI 25.69 kg/m  BP  Readings from Last 3 Encounters:  03/31/23 (!) 155/86  02/02/23 (!) 144/86  12/29/22 120/76   Wt Readings from Last 3 Encounters:  03/31/23 164 lb (74.4 kg)  02/02/23 148 lb (67.1 kg)  12/29/22 152 lb (68.9 kg)       Physical Exam Vitals reviewed.  Constitutional:      General: He is not in acute  distress.    Appearance: Normal appearance. He is not ill-appearing, toxic-appearing or diaphoretic.  HENT:     Head:   Eyes:     Conjunctiva/sclera: Conjunctivae normal.  Cardiovascular:     Rate and Rhythm: Normal rate and regular rhythm.     Pulses: Normal pulses.     Heart sounds: Normal heart sounds. No murmur heard.    No friction rub. No gallop.  Pulmonary:     Effort: Pulmonary effort is normal. No respiratory distress.     Breath sounds: Normal breath sounds. No stridor. No wheezing, rhonchi or rales.  Abdominal:     General: Bowel sounds are normal. There is no distension.     Palpations: Abdomen is soft.     Tenderness: There is no abdominal tenderness.  Musculoskeletal:     Right lower leg: No edema.     Left lower leg: No edema.  Skin:    Findings: No erythema or rash.  Neurological:     General: No focal deficit present.     Mental Status: He is alert and oriented to person, place, and time.     Cranial Nerves: Cranial nerves 2-12 are intact. No cranial nerve deficit or facial asymmetry.     Motor: Tremor present. No abnormal muscle tone.  Psychiatric:        Mood and Affect: Mood and affect normal.        Speech: Speech normal.        Behavior: Behavior normal. Behavior is cooperative.       No results found for any visits on 03/31/23.  Assessment & Plan     Problem List Items Addressed This Visit       Cardiovascular and Mediastinum   Essential (primary) hypertension - Primary   Relevant Medications   valsartan (DIOVAN) 40 MG tablet     Other   Insomnia, persistent   Relevant Medications   zolpidem (AMBIEN) 5 MG tablet   Falls frequently    Other Visit Diagnoses       Imbalance           Assessment & Plan Falls Recurrent falls, primarily nocturnal, with six incidents this year. Recent fall resulted in facial bruising. Ambien and alcohol use may contribute to fall risk. Concerns about potential head injury due to falls, especially with concurrent Eliquis use. Sedative effects of Ambien, alcohol, and tramadol may exacerbate fall risk. - Reduce Ambien to 5 mg at night. - Advise reducing alcohol consumption to every other day. - Discuss potential risks of falls and head injury, especially with Eliquis use. - Consider CT of the head if symptoms suggestive of head injury develop.  Insomnia chronic Insomnia managed with Ambien, previously increased to 10 mg per patient request due to lack of sleep on 5mg  due to age >65 years. Concerns about Ambien contributing to frequent falls and lack of memory regarding the events around falls and potential interactions with alcohol and other medications. Reduction of Ambien aims to minimize fall risk and adverse interactions with other sedatives. - Reduce Ambien to 5 mg at night. - Discuss risks of Ambien use, especially in combination with alcohol and other medications.  Hypertension chronic Blood pressure elevated at 155/86. He has discontinued valsartan and is not currently on antihypertensive medication. Valsartan is indicated for both blood pressure management and renal protection due to chronic kidney disease. Restarting valsartan at a lower dose is recommended for its dual benefits. - Prescribe valsartan 40 mg for blood pressure control  and kidney protection. - Monitor blood pressure at home. If consistently above 150/90, continue valsartan. - Advise to hold blood pressure medication if dizziness or hypotensive symptoms occur.  Chronic Kidney Disease Chronic kidney disease recently diagnosed, managed with valsartan recommended for renal protection. Previous nephrology evaluation  noted. Valsartan use aims to slow kidney disease progression by reducing blood pressure and providing renal protection. - Continue valsartan for kidney protection. - Follow up with nephrology as needed.  General Health Maintenance Improvement noted with weight gain and increased activity. Hearing aids are being addressed with an upcoming appointment. - Schedule hearing test and obtain new hearing aids.  Follow-up Follow-up plans include monitoring blood pressure and overall health. Cardiologist follow-up for heart monitor results and pacemaker battery replacement is scheduled. - Schedule follow-up appointment in June or July. - Follow up with cardiology for heart monitor results and pacemaker battery replacement.     No follow-ups on file.         [name], MD  [facility] [phone] (phone) [phone] (fax)  [facility]

## 2023-04-04 DIAGNOSIS — R002 Palpitations: Secondary | ICD-10-CM | POA: Diagnosis not present

## 2023-04-26 ENCOUNTER — Other Ambulatory Visit: Payer: Self-pay | Admitting: Family Medicine

## 2023-04-26 DIAGNOSIS — M170 Bilateral primary osteoarthritis of knee: Secondary | ICD-10-CM

## 2023-05-10 ENCOUNTER — Other Ambulatory Visit: Payer: Self-pay | Admitting: Family Medicine

## 2023-05-10 DIAGNOSIS — G4700 Insomnia, unspecified: Secondary | ICD-10-CM

## 2023-05-11 NOTE — Telephone Encounter (Signed)
 Requested medication (s) are due for refill today - yes  Requested medication (s) are on the active medication list -yes  Future visit scheduled -yes  Last refill: 03/31/23 #40  Notes to clinic: non delegated Rx  Requested Prescriptions  Pending Prescriptions Disp Refills   zolpidem  (AMBIEN ) 5 MG tablet [Pharmacy Med Name: ZOLPIDEM  TARTRATE 5MG  TABLET] 40 tablet 0    Sig: TAKE ONE TABLET (5 MG TOTAL) BY MOUTH AT BEDTIME.     Not Delegated - Psychiatry:  Anxiolytics/Hypnotics Failed - 05/11/2023  8:53 AM      Failed - This refill cannot be delegated      Failed - Urine Drug Screen completed in last 360 days      Passed - Valid encounter within last 6 months    Recent Outpatient Visits           1 month ago Essential (primary) hypertension   [place] [facility] Simmons-Robinson, Judyann Number, MD       Future Appointments             In 1 month Simmons-Robinson, Judyann Number, MD [facility]               Requested Prescriptions  Pending Prescriptions Disp Refills   zolpidem  (AMBIEN ) 5 MG tablet [Pharmacy Med Name: ZOLPIDEM  TARTRATE 5MG  TABLET] 40 tablet 0    Sig: TAKE ONE TABLET (5 MG TOTAL) BY MOUTH AT BEDTIME.     Not Delegated - Psychiatry:  Anxiolytics/Hypnotics Failed - 05/11/2023  8:53 AM      Failed - This refill cannot be delegated      Failed - Urine Drug Screen completed in last 360 days      Passed - Valid encounter within last 6 months    Recent Outpatient Visits           1 month ago Essential (primary) hypertension   [place] [facility] Simmons-Robinson, Judyann Number, MD       Future Appointments             In 1 month Simmons-Robinson, Judyann Number, MD [facility]

## 2023-05-24 ENCOUNTER — Other Ambulatory Visit: Payer: Self-pay | Admitting: Family Medicine

## 2023-05-24 DIAGNOSIS — M170 Bilateral primary osteoarthritis of knee: Secondary | ICD-10-CM

## 2023-06-16 ENCOUNTER — Other Ambulatory Visit: Payer: Self-pay | Admitting: Family Medicine

## 2023-06-20 DIAGNOSIS — I495 Sick sinus syndrome: Secondary | ICD-10-CM | POA: Diagnosis not present

## 2023-06-22 ENCOUNTER — Other Ambulatory Visit: Payer: Self-pay | Admitting: Family Medicine

## 2023-06-22 DIAGNOSIS — M170 Bilateral primary osteoarthritis of knee: Secondary | ICD-10-CM

## 2023-06-28 ENCOUNTER — Other Ambulatory Visit: Payer: Self-pay | Admitting: Family Medicine

## 2023-06-28 DIAGNOSIS — G4700 Insomnia, unspecified: Secondary | ICD-10-CM

## 2023-06-30 NOTE — Telephone Encounter (Signed)
 Requested medications are due for refill today.  yes  Requested medications are on the active medications list.  yes  Last refill. 05/11/2023 #30 0 rf  Future visit scheduled.   yes  Notes to clinic.  Refill not delegated.    Requested Prescriptions  Pending Prescriptions Disp Refills   zolpidem  (AMBIEN ) 5 MG tablet [Pharmacy Med Name: ZOLPIDEM  TARTRATE 5MG  TABLET] 40 tablet 0    Sig: TAKE ONE TABLET (5 MG TOTAL) BY MOUTH AT BEDTIME.     Not Delegated - Psychiatry:  Anxiolytics/Hypnotics Failed - 06/30/2023 12:23 PM      Failed - This refill cannot be delegated      Failed - Urine Drug Screen completed in last 360 days      Passed - Valid encounter within last 6 months    Recent Outpatient Visits           3 months ago Essential (primary) hypertension   [place] [facility] Simmons-Robinson, Judyann Number, MD       Future Appointments             In 4 days Simmons-Robinson, Judyann Number, MD [facility], [facility]

## 2023-07-03 ENCOUNTER — Other Ambulatory Visit: Payer: Self-pay | Admitting: Family Medicine

## 2023-07-03 DIAGNOSIS — G4700 Insomnia, unspecified: Secondary | ICD-10-CM

## 2023-07-04 ENCOUNTER — Encounter: Payer: Self-pay | Admitting: Family Medicine

## 2023-07-04 ENCOUNTER — Ambulatory Visit (INDEPENDENT_AMBULATORY_CARE_PROVIDER_SITE_OTHER): Admitting: Family Medicine

## 2023-07-04 VITALS — BP 160/106 | Ht 67.0 in | Wt 167.0 lb

## 2023-07-04 DIAGNOSIS — R627 Adult failure to thrive: Secondary | ICD-10-CM | POA: Diagnosis not present

## 2023-07-04 DIAGNOSIS — I82431 Acute embolism and thrombosis of right popliteal vein: Secondary | ICD-10-CM | POA: Diagnosis not present

## 2023-07-04 DIAGNOSIS — M170 Bilateral primary osteoarthritis of knee: Secondary | ICD-10-CM

## 2023-07-04 DIAGNOSIS — R42 Dizziness and giddiness: Secondary | ICD-10-CM | POA: Diagnosis not present

## 2023-07-04 DIAGNOSIS — R7989 Other specified abnormal findings of blood chemistry: Secondary | ICD-10-CM

## 2023-07-04 DIAGNOSIS — I10 Essential (primary) hypertension: Secondary | ICD-10-CM | POA: Diagnosis not present

## 2023-07-04 DIAGNOSIS — R296 Repeated falls: Secondary | ICD-10-CM | POA: Diagnosis not present

## 2023-07-04 DIAGNOSIS — M1990 Unspecified osteoarthritis, unspecified site: Secondary | ICD-10-CM

## 2023-07-04 DIAGNOSIS — I495 Sick sinus syndrome: Secondary | ICD-10-CM

## 2023-07-04 NOTE — Progress Notes (Signed)
 Established patient visit   Patient: Wesley Small   DOB: [birth year]   88 y.o. Male  MRN: [id number] Visit Date: 07/04/2023  Today's healthcare provider: Rockie Agent, MD   Chief Complaint  Patient presents with  . Hypertension   Subjective       Discussed the use of AI scribe software for clinical note transcription with the patient, who gave verbal consent to proceed.  History of Present Illness Wesley Small is an 88 year old male with hypertension who presents for follow-up of blood pressure management. He is accompanied by his significant other.  He is currently taking metoprolol  25 mg daily and valsartan  40 mg daily for hypertension. His blood pressure was elevated at 160/106 mmHg during the visit, possibly influenced by recent activity. No headaches, vision changes, or unusual sensations related to elevated blood pressure.  He experiences insomnia, finding it difficult to fall and stay asleep. He sometimes wakes up at 2 AM and cannot return to sleep, leading to staying up. Occasionally, he sleeps from 8 PM to noon, occurring about once a month. He takes Ambien  5 mg daily for insomnia.  He has a history of falls but has not fallen recently. He feels tentative with balance and notes knee discomfort, requiring caution.  He has a history of kidney issues, with previous blood work and urine samples taken, but missed a follow-up appointment. He recalls past elevation in creatinine levels.  He previously took Eliquis  but stopped due to cost and lack of perceived need. He has not experienced significant swelling like when he had a clot.  He reports feeling better overall, maintaining his weight at 167 pounds, and eating well. He feels he has 'turned a corner' in terms of general well-being.     Past Medical History:  Diagnosis Date  . Arthritis   . Dysrhythmia    RBBB  . GERD (gastroesophageal reflux disease)   . History of blood transfusion   .  History of diverticulitis   . History of hiatal hernia   . HOH (hard of hearing)   . Hypercholesterolemia   . Hypertension   . Presence of permanent cardiac pacemaker   . Sleep apnea     Medications: Outpatient Medications Prior to Visit  Medication Sig  . ELIQUIS  5 MG TABS tablet TAKE ONE TABLET BY MOUTH TWO TMES DAILY  . metoprolol  succinate (TOPROL -XL) 25 MG 24 hr tablet Take 1 tablet (25 mg total) by mouth daily.  . sertraline  (ZOLOFT ) 50 MG tablet TAKE ONE TABLET (50 MG TOTAL) BY MOUTH DAILY.  . traMADol  (ULTRAM ) 50 MG tablet TAKE ONE TABLET BY MOUTH TWICE A DAY AS NEEDED FOR PAIN  . valsartan  (DIOVAN ) 40 MG tablet Take 1 tablet (40 mg total) by mouth daily.  . zolpidem  (AMBIEN ) 5 MG tablet TAKE ONE TABLET (5 MG TOTAL) BY MOUTH AT BEDTIME.   No facility-administered medications prior to visit.    Review of Systems  Last metabolic panel Lab Results  Component Value Date   GLUCOSE 107 (H) 02/02/2023   NA 142 02/02/2023   K 5.5 (H) 02/02/2023   CL 104 02/02/2023   CO2 21 02/02/2023   BUN 24 02/02/2023   CREATININE 1.70 (H) 02/02/2023   EGFR 39 (L) 02/02/2023   CALCIUM  9.8 02/02/2023   PHOS 2.3 (L) 12/29/2022   PROT 6.2 02/02/2023   ALBUMIN 4.0 02/02/2023   LABGLOB 2.2 02/02/2023   AGRATIO 2.1 10/28/2021   BILITOT 1.0 02/02/2023  ALKPHOS 105 02/02/2023   AST 33 02/02/2023   ALT 25 02/02/2023   ANIONGAP 14 07/10/2022        Objective    BP (!) 160/106   Ht 5' 7 (1.702 m)   Wt 167 lb (75.8 kg)   BMI 26.16 kg/m  BP Readings from Last 3 Encounters:  07/04/23 (!) 160/106  03/31/23 (!) 155/86  02/02/23 (!) 144/86   Wt Readings from Last 3 Encounters:  07/04/23 167 lb (75.8 kg)  03/31/23 164 lb (74.4 kg)  02/02/23 148 lb (67.1 kg)        Physical Exam Vitals reviewed.  Constitutional:      General: He is not in acute distress.    Appearance: Normal appearance. He is not ill-appearing.   Cardiovascular:     Rate and Rhythm: Normal rate and  regular rhythm.     Comments: Trace edema in LE  Pulmonary:     Effort: Pulmonary effort is normal. No respiratory distress.     Breath sounds: No wheezing, rhonchi or rales.   Neurological:     Mental Status: He is alert and oriented to person, place, and time.   Psychiatric:        Mood and Affect: Mood normal.        Behavior: Behavior normal.      No results found for any visits on 07/04/23.  Assessment & Plan     Problem List Items Addressed This Visit       Cardiovascular and Mediastinum   Essential (primary) hypertension - Primary     Assessment & Plan Hypertension Blood pressure is elevated at 160/106 mmHg. Current regimen includes metoprolol  25 mg daily and valsartan  40 mg daily. No symptoms such as headache or vision changes reported. Discussed the importance of monitoring blood pressure and potential adjustments to medication if needed. - Continue metoprolol  25 mg daily - Continue valsartan  40 mg daily - Recheck blood pressure before leaving the office - Follow up in October  Chronic Kidney Disease Elevated creatinine levels with missed follow-up appointments with nephrologist. Blood work and urine sample were previously taken, but follow-up is needed to assess current kidney function. Discussed the importance of monitoring kidney function and electrolytes. - Order blood tests to check kidney function and electrolytes - Follow up with nephrologist  Insomnia Difficulty initiating and maintaining sleep, often waking at 2 AM and unable to return to sleep. Occasionally sleeps excessively from 8 PM to noon. Current regimen includes Ambien  5 mg daily, reduced due to advanced age. Discussed adjusting bedtime to later in the evening to improve sleep duration. - Continue Ambien  5 mg daily - Consider adjusting bedtime to later in the evening to improve sleep duration  Deep Vein Thrombosis (DVT) Previously treated with Eliquis , discontinued due to cost. No current signs  of significant swelling or clot recurrence. Treatment duration was sufficient, and no need to restart anticoagulation unless symptoms recur. Considered alternative anticoagulation options like warfarin, but decided against due to the need for regular blood tests. - Monitor for signs of swelling or clot recurrence - No current anticoagulation therapy needed  General Health Maintenance Eating well, maintaining weight, ambulating, and feeling better overall. Discussed the importance of maintaining a healthy lifestyle and monitoring health conditions. - Encourage continued healthy eating and weight maintenance - Encourage ambulation and physical activity as tolerated  Goals of Care Discussion about potential need for assisted living in the future. Referral to [facility] for assistance with planning and decision-making  regarding living arrangements. Emphasized the importance of planning for future care needs and ensuring his preferences are considered. - Place referral to [facility] for assistance with assisted living options  Follow-up Scheduled follow-up appointments and blood pressure monitoring. Discussed the importance of attending follow-up appointments with specialists. - Follow up in October for blood pressure and general health assessment - Ensure follow-up with cardiologist and nephrologist Please leave a voicemail requesting patient to answer from the doctor's office in case he does not answer     No follow-ups on file.         Rockie Agent, MD  [facility] [phone] (phone) [phone] (fax)  [facility]

## 2023-07-05 LAB — RENAL FUNCTION PANEL
Albumin: 4.2 g/dL (ref 3.7–4.7)
BUN/Creatinine Ratio: 9 — ABNORMAL LOW (ref 10–24)
BUN: 11 mg/dL (ref 8–27)
CO2: 20 mmol/L (ref 20–29)
Calcium: 9.5 mg/dL (ref 8.6–10.2)
Chloride: 102 mmol/L (ref 96–106)
Creatinine, Ser: 1.18 mg/dL (ref 0.76–1.27)
Glucose: 119 mg/dL — ABNORMAL HIGH (ref 70–99)
Phosphorus: 2.9 mg/dL (ref 2.8–4.1)
Potassium: 4.5 mmol/L (ref 3.5–5.2)
Sodium: 142 mmol/L (ref 134–144)
eGFR: 60 mL/min/{1.73_m2} (ref >59)

## 2023-07-06 ENCOUNTER — Ambulatory Visit: Payer: Self-pay | Admitting: Family Medicine

## 2023-07-21 ENCOUNTER — Other Ambulatory Visit: Payer: Self-pay | Admitting: Family Medicine

## 2023-07-25 ENCOUNTER — Telehealth: Payer: Self-pay

## 2023-07-25 NOTE — Progress Notes (Signed)
 Complex Care Management Note Care Guide Note  07/25/2023 Name: Wesley Small MRN: [id number] DOB: 03/16/1935   Complex Care Management Outreach Attempts: An unsuccessful telephone outreach was attempted today to offer the patient information about available complex care management services.  Follow Up Plan:  Additional outreach attempts will be made to offer the patient complex care management information and services.   Encounter Outcome:  No Answer  Dreama [facility]  [facility], [facility] Health Care Management Assistant Direct Dial: [phone]  Fax: [phone]

## 2023-08-02 NOTE — Progress Notes (Signed)
 Complex Care Management Note Care Guide Note  08/02/2023 Name: Wesley Small MRN: [id number] DOB: [birth year]   Complex Care Management Outreach Attempts: A second unsuccessful outreach was attempted today to offer the patient with information about available complex care management services.  Follow Up Plan:  Additional outreach attempts will be made to offer the patient complex care management information and services.   Encounter Outcome:  No Answer  Dreama [facility]  [facility], [facility] Health Care Management Assistant Direct Dial: [phone]  Fax: [phone]

## 2023-08-07 NOTE — Progress Notes (Signed)
 Complex Care Management Note Care Guide Note  08/07/2023 Name: Wesley Small MRN: [id number] DOB: [birth year]   Complex Care Management Outreach Attempts: A third unsuccessful outreach was attempted today to offer the patient with information about available complex care management services.  Follow Up Plan:  No further outreach attempts will be made at this time. We have been unable to contact the patient to offer or enroll patient in complex care management services.  Encounter Outcome:  No Answer  Dreama [facility]  [facility], [facility] Health Care Management Assistant Direct Dial: [phone]  Fax: [phone]

## 2023-09-06 DIAGNOSIS — Z950 Presence of cardiac pacemaker: Secondary | ICD-10-CM | POA: Diagnosis not present

## 2023-09-06 DIAGNOSIS — I10 Essential (primary) hypertension: Secondary | ICD-10-CM | POA: Diagnosis not present

## 2023-09-06 DIAGNOSIS — E782 Mixed hyperlipidemia: Secondary | ICD-10-CM | POA: Diagnosis not present

## 2023-09-06 DIAGNOSIS — Z45010 Encounter for checking and testing of cardiac pacemaker pulse generator [battery]: Secondary | ICD-10-CM | POA: Diagnosis not present

## 2023-09-06 DIAGNOSIS — I82531 Chronic embolism and thrombosis of right popliteal vein: Secondary | ICD-10-CM | POA: Diagnosis not present

## 2023-09-06 DIAGNOSIS — I495 Sick sinus syndrome: Secondary | ICD-10-CM | POA: Diagnosis not present

## 2023-09-12 ENCOUNTER — Other Ambulatory Visit: Payer: Self-pay | Admitting: Family Medicine

## 2023-09-12 DIAGNOSIS — M170 Bilateral primary osteoarthritis of knee: Secondary | ICD-10-CM

## 2023-09-21 ENCOUNTER — Other Ambulatory Visit: Payer: Self-pay

## 2023-09-21 ENCOUNTER — Ambulatory Visit
Admission: RE | Admit: 2023-09-21 | Discharge: 2023-09-21 | Disposition: A | Discharge status: Home / Self Care | Attending: Cardiology | Admitting: Cardiology

## 2023-09-21 ENCOUNTER — Encounter
Admission: RE | Disposition: A | Discharge status: Home / Self Care | Payer: Self-pay | Source: Home / Self Care | Attending: Cardiology

## 2023-09-21 ENCOUNTER — Encounter: Payer: Self-pay | Admitting: Cardiology

## 2023-09-21 DIAGNOSIS — E782 Mixed hyperlipidemia: Secondary | ICD-10-CM | POA: Insufficient documentation

## 2023-09-21 DIAGNOSIS — I495 Sick sinus syndrome: Secondary | ICD-10-CM | POA: Diagnosis not present

## 2023-09-21 DIAGNOSIS — Z45010 Encounter for checking and testing of cardiac pacemaker pulse generator [battery]: Secondary | ICD-10-CM | POA: Diagnosis not present

## 2023-09-21 DIAGNOSIS — Z86718 Personal history of other venous thrombosis and embolism: Secondary | ICD-10-CM | POA: Insufficient documentation

## 2023-09-21 DIAGNOSIS — Z87891 Personal history of nicotine dependence: Secondary | ICD-10-CM | POA: Diagnosis not present

## 2023-09-21 DIAGNOSIS — Z79899 Other long term (current) drug therapy: Secondary | ICD-10-CM | POA: Diagnosis not present

## 2023-09-21 DIAGNOSIS — Z91141 Patient's other noncompliance with medication regimen due to financial hardship: Secondary | ICD-10-CM | POA: Insufficient documentation

## 2023-09-21 DIAGNOSIS — I10 Essential (primary) hypertension: Secondary | ICD-10-CM | POA: Insufficient documentation

## 2023-09-21 HISTORY — PX: PPM GENERATOR CHANGEOUT: EP1233

## 2023-09-21 SURGERY — PPM GENERATOR CHANGEOUT
Anesthesia: Moderate Sedation

## 2023-09-21 MED ORDER — HEPARIN (PORCINE) IN NACL 1000-0.9 UT/500ML-% IV SOLN
INTRAVENOUS | Status: AC
  Filled 2023-09-21: qty 500

## 2023-09-21 MED ORDER — CEFAZOLIN SODIUM-DEXTROSE 2-4 GM/100ML-% IV SOLN
INTRAVENOUS | Status: AC
  Filled 2023-09-21: qty 100

## 2023-09-21 MED ORDER — VANCOMYCIN HCL IN DEXTROSE 1-5 GM/200ML-% IV SOLN
1000.0000 mg | INTRAVENOUS | Status: DC
  Filled 2023-09-21: qty 200

## 2023-09-21 MED ORDER — MIDAZOLAM HCL 2 MG/2ML IJ SOLN
INTRAMUSCULAR | Status: DC | PRN
  Administered 2023-09-21: 1 mg via INTRAVENOUS

## 2023-09-21 MED ORDER — SODIUM CHLORIDE 0.9 % IV SOLN
INTRAVENOUS | Status: DC | PRN
  Administered 2023-09-21: 80 mg

## 2023-09-21 MED ORDER — HEPARIN (PORCINE) IN NACL 1000-0.9 UT/500ML-% IV SOLN
INTRAVENOUS | Status: DC | PRN
  Administered 2023-09-21: 500 mL

## 2023-09-21 MED ORDER — LIDOCAINE HCL (PF) 1 % IJ SOLN
INTRAMUSCULAR | Status: DC | PRN
  Administered 2023-09-21: 30 mL

## 2023-09-21 MED ORDER — CHLORHEXIDINE GLUCONATE CLOTH 2 % EX PADS
6.0000 | MEDICATED_PAD | Freq: Every day | CUTANEOUS | Status: DC
  Administered 2023-09-21: 6 via TOPICAL

## 2023-09-21 MED ORDER — VANCOMYCIN HCL 500 MG/100ML IV SOLN
INTRAVENOUS | Status: DC | PRN
  Administered 2023-09-21: 200 mg via INTRAVENOUS

## 2023-09-21 MED ORDER — FENTANYL CITRATE (PF) 100 MCG/2ML IJ SOLN
INTRAMUSCULAR | Status: AC
  Filled 2023-09-21: qty 2

## 2023-09-21 MED ORDER — IRBESARTAN 75 MG PO TABS
37.5000 mg | ORAL_TABLET | Freq: Every day | ORAL | Status: DC
  Filled 2023-09-21: qty 0.5

## 2023-09-21 MED ORDER — FENTANYL CITRATE (PF) 100 MCG/2ML IJ SOLN
INTRAMUSCULAR | Status: DC | PRN
  Administered 2023-09-21: 25 ug via INTRAVENOUS

## 2023-09-21 MED ORDER — SODIUM CHLORIDE 0.9 % IV SOLN: INTRAVENOUS | Status: DC

## 2023-09-21 MED ORDER — CLARITHROMYCIN 250 MG PO TABS: 250.0000 mg | ORAL_TABLET | Freq: Two times a day (BID) | ORAL | 0 refills | Status: AC

## 2023-09-21 MED ORDER — ACETAMINOPHEN 325 MG PO TABS: 325.0000 mg | ORAL_TABLET | ORAL | Status: DC | PRN

## 2023-09-21 MED ORDER — CEFAZOLIN SODIUM-DEXTROSE 2-4 GM/100ML-% IV SOLN: 2.0000 g | INTRAVENOUS | Status: DC

## 2023-09-21 MED ORDER — MIDAZOLAM HCL 2 MG/2ML IJ SOLN
INTRAMUSCULAR | Status: AC
  Filled 2023-09-21: qty 2

## 2023-09-21 MED ORDER — LIDOCAINE HCL 1 % IJ SOLN
INTRAMUSCULAR | Status: AC
  Filled 2023-09-21: qty 60

## 2023-09-21 MED ORDER — SODIUM CHLORIDE 0.9 % IV SOLN
80.0000 mg | INTRAVENOUS | Status: DC
  Filled 2023-09-21: qty 2

## 2023-09-21 MED ORDER — ONDANSETRON HCL 4 MG/2ML IJ SOLN: 4.0000 mg | Freq: Four times a day (QID) | INTRAMUSCULAR | Status: DC | PRN

## 2023-09-21 SURGICAL SUPPLY — 10 items
CABLE SURG 12 DISP A/V CHANNEL (MISCELLANEOUS) IMPLANT
DEVICE DSSCT PLSMBLD 3.0S LGHT (MISCELLANEOUS) IMPLANT
DRAPE INCISE 23X17 STRL (DRAPES) IMPLANT
IPG PACE AZUR XT DR MRI W1DR01 (Pacemaker) IMPLANT
KIT SYRINGE INJ CVI SPIKEX1 (MISCELLANEOUS) IMPLANT
PAD ELECT DEFIB RADIOL ZOLL (MISCELLANEOUS) IMPLANT
POUCH AIGIS-R ANTIBACT PPM MED (Mesh General) IMPLANT
SUT VIC AB 2-0 CT2 27 (SUTURE) IMPLANT
SUT VIC AB 4-0 PS2 18 (SUTURE) IMPLANT
TRAY PACEMAKER INSERTION (PACKS) ×1 IMPLANT

## 2023-09-21 NOTE — Discharge Instructions (Addendum)
 Patient may shower 09/23/2023.  May remove outer bandage, leave Steri-Strips on.Pacemaker Battery Change, Care After This sheet gives you information about how to care for yourself after your procedure. Your health care provider may also give you more specific instructions. If you have problems or questions, contact your health care provider. What can I expect after the procedure? After the procedure, it is common to have these symptoms at the site where the pacemaker was inserted: Mild pain or soreness. Slight bruising. Some swelling over the incisions. A slight bump over the skin where the device was placed (if it was implanted in the upper chest area). Sometimes, it is possible to feel the device under the skin. This is normal. Follow these instructions at home: Incision care  Keep the incision clean and dry for 2-3 days after the procedure or as told by your health care provider. It takes several weeks for the incision site to completely heal. Do not remove the bandage (dressing) on your chest until told to do so by your health care provider. Leave stitches (sutures), skin glue, or adhesive strips in place. These skin closures may need to stay in place for 2 weeks or longer. If adhesive strip edges start to loosen and curl up, you may trim the loose edges. Do not remove adhesive strips completely unless your health care provider tells you to do that. You may shower in 2 days Pat the incision area dry with a clean towel. Do not rub the area. This may cause bleeding. Check your incision area every day for signs of infection. Check for: More redness, swelling, or pain. Fluid or blood. Warmth. Pus or a bad smell. Avoid putting pressure on the area where the pacemaker was placed. Women may want to place a small pad over the incision site to protect it from their bra strap. Avoid using lotion on skin until insertion site heals completely Medicines Take over-the-counter and prescription medicines  only as told by your health care provider. If you were prescribed an antibiotic medicine, take it as told by your health care provider. Do not stop taking the antibiotic even if you start to feel better. Activity For the first 2 weeks, or as long as told by your health care provider: Avoid lifting anything heavy Avoid exercise or activities that take a lot of effort. If you were given a medicine to help you relax (sedative) during the procedure, it can affect you for several hours. Do not drive or operate machinery until your health care provider says that it is safe. General instructions Do not use any products that contain nicotine or tobacco, such as cigarettes, e-cigarettes, and chewing tobacco. These can delay incision healing after surgery. If you need help quitting, ask your health care provider. Always let all health care providers, including dentists, know about your pacemaker before you have any medical procedures or tests. You may be shown how to transfer data from your pacemaker through the phone to your health care provider. Wear a medical ID bracelet or necklace stating that you have a pacemaker, and carry a pacemaker ID card with you at all times. Avoid close and prolonged exposure to electrical devices that have strong magnetic fields. These include: Insurance claims handler. When at the airport, let officials know that you have a pacemaker. Carry your pacemaker ID card. Metal detectors. If you must pass through a metal detector, walk through it quickly. Do not stop under the detector or stand near it. When using your mobile phone,  hold it to the ear opposite the pacemaker. Do not leave your mobile phone in a pocket over the pacemaker. Your pacemaker battery will last for 5-15 years. Your health care provider will do routine checks to know when the battery is starting to run down. When this happens, the pacemaker will need to be replaced. Keep all follow-up visits as told by your  health care provider. This is important. Contact a health care provider if: You have pain at the incision site that is not relieved by medicines. You have any of these signs of infection: More redness, swelling, or pain around your incision. Fluid or blood coming from your incision. Warmth coming from your incision. Pus or a bad smell coming from your incision. A fever. You feel brief, occasional palpitations, light-headedness, or any symptoms that you think might be related to your heart. Get help right away if: You have chest pain that is different from the pain at the pacemaker site. You develop a red streak that extends above or below the incision site. You have shortness of breath. You have palpitations or an irregular heartbeat. You have light-headedness that does not go away quickly. You faint or have dizzy spells. Your pulse suddenly drops or increases rapidly and does not return to normal. You gain weight and your legs and ankles swell. Summary After the procedure, it is common to have pain, soreness, and some swelling or bruising where the pacemaker was inserted. Keep your incision clean and dry. Follow instructions from your health care provider about how to take care of your incision. Check your incision every day for signs of infection, such as more pain or swelling, pus or a bad smell, warmth, or leaking fluid or blood. Carry a pacemaker ID card with you at all times. This information is not intended to replace advice given to you by your health care provider. Make sure you discuss any questions you have with your health care provider. Document Revised: 11/29/2018 Document Reviewed: 11/29/2018 Elsevier Patient Education  2023 [employer].

## 2023-10-02 DIAGNOSIS — R002 Palpitations: Secondary | ICD-10-CM | POA: Diagnosis not present

## 2023-10-02 DIAGNOSIS — I495 Sick sinus syndrome: Secondary | ICD-10-CM | POA: Diagnosis not present

## 2023-10-02 DIAGNOSIS — Z950 Presence of cardiac pacemaker: Secondary | ICD-10-CM | POA: Diagnosis not present

## 2023-10-02 DIAGNOSIS — I10 Essential (primary) hypertension: Secondary | ICD-10-CM | POA: Diagnosis not present

## 2023-11-02 ENCOUNTER — Ambulatory Visit: Admitting: Family Medicine

## 2023-11-09 ENCOUNTER — Other Ambulatory Visit: Payer: Self-pay | Admitting: Family Medicine

## 2023-11-09 DIAGNOSIS — G4700 Insomnia, unspecified: Secondary | ICD-10-CM

## 2023-11-21 DIAGNOSIS — Z23 Encounter for immunization: Secondary | ICD-10-CM | POA: Diagnosis not present

## 2023-12-18 ENCOUNTER — Other Ambulatory Visit: Payer: Self-pay | Admitting: Family Medicine

## 2023-12-18 DIAGNOSIS — M170 Bilateral primary osteoarthritis of knee: Secondary | ICD-10-CM

## 2023-12-27 ENCOUNTER — Ambulatory Visit: Payer: Self-pay

## 2023-12-27 VITALS — Ht 67.0 in | Wt 160.0 lb

## 2023-12-27 DIAGNOSIS — Z0000 Encounter for general adult medical examination without abnormal findings: Secondary | ICD-10-CM | POA: Diagnosis not present

## 2023-12-27 NOTE — Patient Instructions (Signed)
 [name],  Thank you for taking the time for your Medicare Wellness Visit. I appreciate your continued commitment to your health goals. Please review the care plan we discussed, and feel free to reach out if I can assist you further.  Please note that Annual Wellness Visits do not include a physical exam. Some assessments may be limited, especially if the visit was conducted virtually. If needed, we may recommend an in-person follow-up with your provider.  Ongoing Care Seeing your primary care provider every 3 to 6 months helps us  monitor your health and provide consistent, personalized care. I have scheduled an appointment to see Dr Marcine Essex on 03/18/24 @ 2:20pm.   Referrals If a referral was made during today's visit and you haven't received any updates within two weeks, please contact the referred provider directly to check on the status.  Recommended Screenings:  You may get a tetanus vaccine at your local pharmacy at your convenience.   Health Maintenance  Topic Date Due   DTaP/Tdap/Td vaccine (2 - Td or Tdap) 10/11/2020   Flu Shot  08/11/2023   COVID-19 Vaccine (5 - 2025-26 season) 09/11/2023   Medicare Annual Wellness Visit  12/21/2023   Pneumococcal Vaccine for age over 55  Completed   Zoster (Shingles) Vaccine  Completed   Meningitis B Vaccine  Aged Out       12/27/2023    4:11 PM  Advanced Directives  Does Patient Have a Medical Advance Directive? Yes  Type of Advance Directive Healthcare Power of Attorney  Does patient want to make changes to medical advance directive? No - Patient declined  Copy of Healthcare Power of Attorney in Chart? Yes - validated most recent copy scanned in chart (See row information)    Vision: Annual vision screenings are recommended for early detection of glaucoma, cataracts, and diabetic retinopathy. These exams can also reveal signs of chronic conditions such as diabetes and high blood pressure.  Dental: Annual dental  screenings help detect early signs of oral cancer, gum disease, and other conditions linked to overall health, including heart disease and diabetes.  Please see the attached documents for additional preventive care recommendations.   Managing Pain Without Opioids Opioids are strong medicines used to treat moderate to severe pain. For some people, especially those who have long-term (chronic) pain, opioids may not be the best choice for pain management due to: Side effects like nausea, constipation, and sleepiness. The risk of addiction (opioid use disorder). The longer you take opioids, the greater your risk of addiction. Pain that lasts for more than 3 months is called chronic pain. Managing chronic pain usually requires more than one approach and is often provided by a team of health care providers working together (multidisciplinary approach). Pain management may be done at a pain management center or pain clinic. How to manage pain without the use of opioids Use non-opioid medicines Non-opioid medicines for pain may include: Over-the-counter or prescription non-steroidal anti-inflammatory drugs (NSAIDs). These may be the first medicines used for pain. They work well for muscle and bone pain, and they reduce swelling. Acetaminophen . This over-the-counter medicine may work well for milder pain but not swelling. Antidepressants. These may be used to treat chronic pain. A certain type of antidepressant (tricyclics) is often used. These medicines are given in lower doses for pain than when used for depression. Anticonvulsants. These are usually used to treat seizures but may also reduce nerve (neuropathic) pain. Muscle relaxants. These relieve pain caused by sudden muscle tightening (spasms).  You may also use a pain medicine that is applied to the skin as a patch, cream, or gel (topical analgesic), such as a numbing medicine. These may cause fewer side effects than medicines taken by mouth. Do  certain therapies as directed Some therapies can help with pain management. They include: Physical therapy. You will do exercises to gain strength and flexibility. A physical therapist may teach you exercises to move and stretch parts of your body that are weak, stiff, or painful. You can learn these exercises at physical therapy visits and practice them at home. Physical therapy may also involve: Massage. Heat wraps or applying heat or cold to affected areas. Electrical signals that interrupt pain signals (transcutaneous electrical nerve stimulation, TENS). Weak lasers that reduce pain and swelling (low-level laser therapy). Signals from your body that help you learn to regulate pain (biofeedback). Occupational therapy. This helps you to learn ways to function at home and work with less pain. Recreational therapy. This involves trying new activities or hobbies, such as a physical activity or drawing. Mental health therapy, including: Cognitive behavioral therapy (CBT). This helps you learn coping skills for dealing with pain. Acceptance and commitment therapy (ACT) to change the way you think and react to pain. Relaxation therapies, including muscle relaxation exercises and mindfulness-based stress reduction. Pain management counseling. This may be individual, family, or group counseling.  Receive medical treatments Medical treatments for pain management include: Nerve block injections. These may include a pain blocker and anti-inflammatory medicines. You may have injections: Near the spine to relieve chronic back or neck pain. Into joints to relieve back or joint pain. Into nerve areas that supply a painful area to relieve body pain. Into muscles (trigger point injections) to relieve some painful muscle conditions. A medical device placed near your spine to help block pain signals and relieve nerve pain or chronic back pain (spinal cord stimulation device). Acupuncture. Follow these  instructions at home Medicines Take over-the-counter and prescription medicines only as told by your health care provider. If you are taking pain medicine, ask your health care providers about possible side effects to watch out for. Do not drive or use heavy machinery while taking prescription opioid pain medicine. Lifestyle  Do not use drugs or alcohol to reduce pain. If you drink alcohol, limit how much you have to: 0-1 drink a day for women who are not pregnant. 0-2 drinks a day for men. Know how much alcohol is in a drink. In the [place] one drink equals one 12 oz bottle of beer (355 mL), one 5 oz glass of wine (148 mL), or one 1 oz glass of hard liquor (44 mL). Do not use any products that contain nicotine or tobacco. These products include cigarettes, chewing tobacco, and vaping devices, such as e-cigarettes. If you need help quitting, ask your health care provider. Eat a healthy diet and maintain a healthy weight. Poor diet and excess weight may make pain worse. Eat foods that are high in fiber. These include fresh fruits and vegetables, whole grains, and beans. Limit foods that are high in fat and processed sugars, such as fried and sweet foods. Exercise regularly. Exercise lowers stress and may help relieve pain. Ask your health care provider what activities and exercises are safe for you. If your health care provider approves, join an exercise class that combines movement and stress reduction. Examples include yoga and tai chi. Get enough sleep. Lack of sleep may make pain worse. Lower stress as much as possible. Practice  stress reduction techniques as told by your therapist. General instructions Work with all your pain management providers to find the treatments that work best for you. You are an important member of your pain management team. There are many things you can do to reduce pain on your own. Consider joining an online or in-person support group for people who have chronic  pain. Keep all follow-up visits. This is important. Where to find more information You can find more information about managing pain without opioids from: American Academy of Pain Medicine: painmed.org Institute for Chronic Pain: instituteforchronicpain.org American Chronic Pain Association: theacpa.org Contact a health care provider if: You have side effects from pain medicine. Your pain gets worse or does not get better with treatments or home therapy. You are struggling with anxiety or depression. Summary Many types of pain can be managed without opioids. Chronic pain may respond better to pain management without opioids. Pain is best managed when you and a team of health care providers work together. Pain management without opioids may include non-opioid medicines, medical treatments, physical therapy, mental health therapy, and lifestyle changes. Tell your health care providers if your pain gets worse or is not being managed well enough. This information is not intended to replace advice given to you by your health care provider. Make sure you discuss any questions you have with your health care provider. Document Revised: 04/08/2020 Document Reviewed: 04/08/2020 Elsevier Patient Education  2024 [employer].

## 2023-12-27 NOTE — Progress Notes (Signed)
 Chief Complaint  Patient presents with   Medicare Wellness     Subjective:   Wesley Small is a 88 y.o. male who presents for a Medicare Annual Wellness Visit.  Visit info / Clinical Intake: Medicare Wellness Visit Type:: Subsequent Annual Wellness Visit Persons participating in visit and providing information:: patient Medicare Wellness Visit Mode:: Telephone If telephone:: video declined Since this visit was completed virtually, some vitals may be partially provided or unavailable. Missing vitals are due to the limitations of the virtual format.: Documented vitals are patient reported If Telephone or Video please confirm:: I connected with patient using audio/video enable telemedicine. I verified patient identity with two identifiers, discussed telehealth limitations, and patient agreed to proceed. Patient Location:: home Provider Location:: home office Interpreter Needed?: No Pre-visit prep was completed: yes AWV questionnaire completed by patient prior to visit?: no Living arrangements:: (!) lives alone Patient's Overall Health Status Rating: good Typical amount of pain: (!) a lot Does pain affect daily life?: (!) yes Are you currently prescribed opioids?: (!) yes  Dietary Habits and Nutritional Risks How many meals a day?: 2 Eats fruit and vegetables daily?: (!) no Most meals are obtained by: preparing own meals In the last 2 weeks, have you had any of the following?: (!) nausea, vomiting, diarrhea (n/v yesterday (10/26/23)) Diabetic:: no  Functional Status Activities of Daily Living (to include ambulation/medication): Independent Ambulation: Independent with device- listed below Home Assistive Devices/Equipment: Eyeglasses; Other (Comment) (hearing aids) Medication Administration: Independent Home Management (perform basic housework or laundry): Independent Manage your own finances?: yes Primary transportation is: driving Concerns about vision?: no *vision  screening is required for WTM* Concerns about hearing?: no  Fall Screening Falls in the past year?: 1 Number of falls in past year: 1 Was there an injury with Fall?: 0 Fall Risk Category Calculator: 2 Patient Fall Risk Level: Moderate Fall Risk  Fall Risk Patient at Risk for Falls Due to: History of fall(s); Impaired mobility; Impaired balance/gait Fall risk Follow up: Falls evaluation completed; Education provided; Falls prevention discussed  Home and Transportation Safety: All rugs have non-skid backing?: yes All stairs or steps have railings?: yes Grab bars in the bathtub or shower?: yes Have non-skid surface in bathtub or shower?: yes Good home lighting?: yes Regular seat belt use?: yes Hospital stays in the last year:: no  Cognitive Assessment Difficulty concentrating, remembering, or making decisions? : no Will 6CIT or Mini Cog be Completed: yes What year is it?: 0 points What month is it?: 0 points Give patient an address phrase to remember (5 components): [address] [place] About what time is it?: 0 points Count backwards from 20 to 1: 0 points Say the months of the year in reverse: 2 points Repeat the address phrase from earlier: 0 points 6 CIT Score: 2 points  Advance Directives (For Healthcare) Does Patient Have a Medical Advance Directive?: Yes Does patient want to make changes to medical advance directive?: No - Patient declined Type of Advance Directive: Healthcare Power of Attorney Copy of Healthcare Power of Attorney in Chart?: Yes - validated most recent copy scanned in chart (See row information)  Reviewed/Updated  Reviewed/Updated: Reviewed All (Medical, Surgical, Family, Medications, Allergies, Care Teams, Patient Goals)    Allergies (verified) Penicillins   Current Medications (verified) Outpatient Encounter Medications as of 12/27/2023  Medication Sig   metoprolol  succinate (TOPROL -XL) 25 MG 24 hr tablet TAKE ONE AND ONE HALF (1/2)  TABLETS (37.5 MG) BY MOUTH ONCE DAILY.  traMADol  (ULTRAM ) 50 MG tablet TAKE ONE TABLET BY MOUTH TWICE A DAY AS NEEDED FOR PAIN   valsartan  (DIOVAN ) 40 MG tablet Take 1 tablet (40 mg total) by mouth daily.   zolpidem  (AMBIEN ) 5 MG tablet TAKE ONE TABLET (5 MG TOTAL) BY MOUTH AT BEDTIME.   ELIQUIS  5 MG TABS tablet TAKE ONE TABLET BY MOUTH TWO TMES DAILY (Patient not taking: Reported on 12/27/2023)   sertraline  (ZOLOFT ) 50 MG tablet TAKE ONE TABLET (50 MG TOTAL) BY MOUTH DAILY. (Patient not taking: Reported on 12/27/2023)   No facility-administered encounter medications on file as of 12/27/2023.    History: Past Medical History:  Diagnosis Date   Arthritis    Dysrhythmia    RBBB   GERD (gastroesophageal reflux disease)    History of blood transfusion    History of diverticulitis    History of hiatal hernia    HOH (hard of hearing)    Hypercholesterolemia    Hypertension    Presence of permanent cardiac pacemaker    Sleep apnea    Past Surgical History:  Procedure Laterality Date   back injection     CATARACT EXTRACTION W/PHACO Right 11/24/2016   Procedure: CATARACT EXTRACTION PHACO AND INTRAOCULAR LENS PLACEMENT (IOC);  Surgeon: Myrna Adine Anes, MD;  Location: [facility] ORS;  Service: Ophthalmology;  Laterality: Right;  Lot # [id number] H US :   00.31  AP%: 10.9 CDE:  3.39   CATARACT EXTRACTION W/PHACO Left 12/29/2016   Procedure: CATARACT EXTRACTION PHACO AND INTRAOCULAR LENS PLACEMENT (IOC);  Surgeon: Myrna Adine Anes, MD;  Location: [facility] ORS;  Service: Ophthalmology;  Laterality: Left;  US  00:33.8 AP% 8.5 CDE 2.86 Fluid Pack lot # [id number]   INJECTION KNEE     INJECTION KNEE     PACEMAKER INSERTION     PPM GENERATOR CHANGEOUT N/A 09/21/2023   Procedure: PPM GENERATOR CHANGEOUT;  Surgeon: Ammon Blunt, MD;  Location: [facility];  Service: Cardiovascular;  Laterality: N/A;   SKIN GRAFT     TONSILLECTOMY AND ADENOIDECTOMY     TRANSURETHRAL RESECTION OF  PROSTATE     Family History  Problem Relation Age of Onset   Heart disease Mother        pacemaker   Depression Mother    Heart disease Father    Social History   Occupational History   Occupation: retired  Tobacco Use   Smoking status: Former    Current packs/day: 0.00    Average packs/day: 1 pack/day for 9.0 years (9.0 ttl pk-yrs)    Types: Cigarettes    Start date: 2    Quit date: 1965    Years since quitting: 61.0   Smokeless tobacco: Never   Tobacco comments:    quit around 63 (88 yo)  Vaping Use   Vaping status: Never Used  Substance and Sexual Activity   Alcohol use: Yes    Alcohol/week: 2.0 standard drinks of alcohol    Types: 2 Shots of liquor per week    Comment: 2 2oz high ball bourbon shots   Drug use: No   Sexual activity: Not on file   Tobacco Counseling Counseling given: Not Answered Tobacco comments: quit around 54 (88 yo)  SDOH Screenings   Food Insecurity: No Food Insecurity (12/27/2023)  Housing: Low Risk (12/27/2023)  Transportation Needs: No Transportation Needs (12/27/2023)  Utilities: Not At Risk (12/27/2023)  Alcohol Screen: Low Risk (12/21/2022)  Depression (PHQ2-9): Low Risk (12/27/2023)  Financial Resource Strain: Low Risk (12/21/2022)  Physical Activity: Inactive (  12/27/2023)  Social Connections: Moderately Isolated (12/27/2023)  Stress: No Stress Concern Present (12/27/2023)  Tobacco Use: Medium Risk (12/27/2023)  Health Literacy: Adequate Health Literacy (12/27/2023)   See flowsheets for full screening details  Depression Screen PHQ 2 & 9 Depression Scale- Over the past 2 weeks, how often have you been bothered by any of the following problems? Little interest or pleasure in doing things: 0 Feeling down, depressed, or hopeless (PHQ Adolescent also includes...irritable): 0 PHQ-2 Total Score: 0 Trouble falling or staying asleep, or sleeping too much: 1 Feeling tired or having little energy: 1 Poor appetite or  overeating (PHQ Adolescent also includes...weight loss): 0 Feeling bad about yourself - or that you are a failure or have let yourself or your family down: 0 Trouble concentrating on things, such as reading the newspaper or watching television (PHQ Adolescent also includes...like school work): 0 Moving or speaking so slowly that other people could have noticed. Or the opposite - being so fidgety or restless that you have been moving around a lot more than usual: 0 Thoughts that you would be better off dead, or of hurting yourself in some way: 0 PHQ-9 Total Score: 2 If you checked off any problems, how difficult have these problems made it for you to do your work, take care of things at home, or get along with other people?: Not difficult at all  Depression Treatment Depression Interventions/Treatment : EYV7-0 Score <4 Follow-up Not Indicated     Goals Addressed               This Visit's Progress     Patient Stated (pt-stated)   Not on track     Would like to have more energy             Objective:    Today's Vitals   12/27/23 1603  Weight: 160 lb (72.6 kg)  Height: 5' 7 (1.702 m)   Body mass index is 25.06 kg/m.  Hearing/Vision screen Hearing Screening - Comments:: Wears hearing aids Vision Screening - Comments:: UTD w/ Dr. Adine Novak [facility] Immunizations and Health Maintenance Health Maintenance  Topic Date Due   DTaP/Tdap/Td (2 - Td or Tdap) 10/11/2020   Influenza Vaccine  08/11/2023   COVID-19 Vaccine (5 - 2025-26 season) 09/11/2023   Medicare Annual Wellness (AWV)  12/26/2024   Pneumococcal Vaccine: 50+ Years  Completed   Zoster Vaccines- Shingrix  Completed   Meningococcal B Vaccine  Aged Out        Assessment/Plan:  This is a routine wellness examination for Leman.  Patient Care Team: Sharma Coyer, MD as PCP - General (Family Medicine) Novak Adine Anes, MD as Consulting Physician (Ophthalmology) Marchia Drivers, MD as  Referring Physician (Orthopedic Surgery) Ammon Blunt, MD as Consulting Physician (Cardiology) Lane Arthea BRAVO, MD as Referring Physician (Neurology) Douglas Rule, MD as Consulting Physician (Nephrology) Clarisa Kung, PA-C as Physician Assistant (Cardiology)  I have personally reviewed and noted the following in the patients chart:   Medical and social history Use of alcohol, tobacco or illicit drugs  Current medications and supplements including opioid prescriptions. Functional ability and status Nutritional status Physical activity Advanced directives List of other physicians Hospitalizations, surgeries, and ER visits in previous 12 months Vitals Screenings to include cognitive, depression, and falls Referrals and appointments  No orders of the defined types were placed in this encounter.  In addition, I have reviewed and discussed with patient certain preventive protocols, quality metrics, and best practice recommendations. A written personalized  care plan for preventive services as well as general preventive health recommendations were provided to patient.   Vina Ned, CMA   12/27/2023   Return in 53 weeks (on 01/01/2025) for Medicare Annual Wellness Visit.  After Visit Summary: (MyChart) Due to this being a telephonic visit, the after visit summary with patients personalized plan was offered to patient via MyChart   Nurse Notes:  6 CIT Score - 2 Rescheduled No Show OV from 11/02/23  to 03/18/24. Needs Tdap vaccine (pharmacy) Flu vaccine UTD per patient. He was unable to give the date of the vaccine Declined Covid vaccine

## 2024-01-17 ENCOUNTER — Ambulatory Visit: Payer: Self-pay

## 2024-01-17 NOTE — Telephone Encounter (Signed)
 FYI Only or Action Required?: FYI only for provider: appointment scheduled on 1.12.26 refused earlier appt.  Patient was last seen in primary care on 07/04/2023 by Sharma Coyer, MD.  Called Nurse Triage reporting Fatigue.  Symptoms began ongoing.  Interventions attempted: Rest, hydration, or home remedies.  Symptoms are: unchanged.  Triage Disposition: See Physician Within 24 Hours  Patient/caregiver understands and will follow disposition?: Yes     Copied from CRM #[id number]. Topic: Clinical - Red Word Triage >> Jan 17, 2024 10:40 AM Winona R wrote: Pt calling to schedule appointment. Experiencing Dizziness no other symptoms Reason for Disposition  [1] MODERATE weakness (e.g., interferes with work, school, normal activities) AND [2] persists > 3 days  Answer Assessment - Initial Assessment Questions Pt's friend calling. She states that he missed his last appt. She states that he is just generally weak. Denies any current higher acuity symptoms. She states sometimes he needs to hold onto things when he walks but not lately. She states he asked if they can go out to lunch so he must be feeling better. She states he isn't eating much but is well hydrated, just generally has no energy.  Rn offered appt for tomorrow, declined. Scheduled for Monday, gave strict instructions on when he needs to go to the Er. She stated understanding.     1. DESCRIPTION: Describe how you are feeling.     Generally weak 2. SEVERITY: How bad is it?  Can you stand and walk?     Can stand and walk, sometimes has to hold onto something 3. ONSET: When did these symptoms begin? (e.g., hours, days, weeks, months)     She states ongoing 4. CAUSE: What do you think is causing the weakness or fatigue? (e.g., not drinking enough fluids, medical problem, trouble sleeping)     unknown 5. NEW MEDICINES:  Have you started on any new medicines recently? (e.g., opioid pain medicines,  benzodiazepines, muscle relaxants, antidepressants, antihistamines, neuroleptics, beta blockers)      6. OTHER SYMPTOMS: Do you have any other symptoms? (e.g., chest pain, fever, cough, SOB, vomiting, diarrhea, bleeding, other areas of pain)     denies  Protocols used: Weakness (Generalized) and Fatigue-A-AH

## 2024-01-22 ENCOUNTER — Ambulatory Visit
Admission: RE | Admit: 2024-01-22 | Discharge: 2024-01-22 | Disposition: A | Discharge status: Home / Self Care | Source: Ambulatory Visit | Attending: Family Medicine | Admitting: Family Medicine

## 2024-01-22 ENCOUNTER — Encounter: Payer: Self-pay | Admitting: Family Medicine

## 2024-01-22 ENCOUNTER — Ambulatory Visit: Admitting: Family Medicine

## 2024-01-22 VITALS — BP 85/69 | HR 86 | Temp 97.8°F | Ht 67.0 in | Wt 156.1 lb

## 2024-01-22 DIAGNOSIS — F339 Major depressive disorder, recurrent, unspecified: Secondary | ICD-10-CM

## 2024-01-22 DIAGNOSIS — R410 Disorientation, unspecified: Secondary | ICD-10-CM

## 2024-01-22 DIAGNOSIS — Z87898 Personal history of other specified conditions: Secondary | ICD-10-CM

## 2024-01-22 DIAGNOSIS — I951 Orthostatic hypotension: Secondary | ICD-10-CM | POA: Diagnosis not present

## 2024-01-22 DIAGNOSIS — R634 Abnormal weight loss: Secondary | ICD-10-CM

## 2024-01-22 DIAGNOSIS — Z9181 History of falling: Secondary | ICD-10-CM

## 2024-01-22 DIAGNOSIS — I495 Sick sinus syndrome: Secondary | ICD-10-CM | POA: Diagnosis not present

## 2024-01-22 DIAGNOSIS — R531 Weakness: Secondary | ICD-10-CM | POA: Diagnosis present

## 2024-01-22 DIAGNOSIS — R627 Adult failure to thrive: Secondary | ICD-10-CM

## 2024-01-22 DIAGNOSIS — Z86718 Personal history of other venous thrombosis and embolism: Secondary | ICD-10-CM | POA: Diagnosis not present

## 2024-01-22 DIAGNOSIS — R2689 Other abnormalities of gait and mobility: Secondary | ICD-10-CM | POA: Insufficient documentation

## 2024-01-22 DIAGNOSIS — R42 Dizziness and giddiness: Secondary | ICD-10-CM

## 2024-01-22 DIAGNOSIS — Z950 Presence of cardiac pacemaker: Secondary | ICD-10-CM

## 2024-01-22 MED ORDER — METOPROLOL SUCCINATE ER 25 MG PO TB24: 25.0000 mg | ORAL_TABLET | Freq: Every day | ORAL | 1 refills | Status: AC

## 2024-01-22 MED ORDER — SERTRALINE HCL 50 MG PO TABS: 50.0000 mg | ORAL_TABLET | Freq: Every day | ORAL | 1 refills | Status: AC

## 2024-01-22 NOTE — Patient Instructions (Signed)
 Discontinue valsartan   Reduce metoprolol  to one tablet daily.  Schedule follow up with cardiology for recheck as well.

## 2024-01-22 NOTE — Progress Notes (Signed)
 "     Established patient visit   Patient: Wesley Small   DOB: 21-Apr-1935   89 y.o. Male  MRN: [id number] Visit Date: 01/22/2024  Today's healthcare provider: LAURAINE LOISE BUOY, DO   Chief Complaint  Patient presents with   Acute Visit    Patient is here today due to being weak, intermittently also he is being forgetful missed his appointment with PCP so his friend felt as though he needed to be seen as soon as possible.  Was prescribed Sertraline  and had been taking it since July, has started back taking it for about 2 weeks.   Subjective    HPI Wesley Small is an 89 year old male who presents with worsening dizziness and fatigue. He is accompanied by Sherrilyn Pac, a friend.  He experiences dizziness that has progressively worsened over time. Initially, he managed by resting his head on a counter until the dizziness subsided, but recently, the episodes have become more frequent. He describes a significant lack of energy, stating he can 'hardly put one foot in front of the other.' He often needs to hold onto someone or something for support when moving around, although he has not fallen in the past 1-2 weeks. His last fall was before Christmas, and he has a history of significant falls, including one where he could not recall the event and had hit his head.  He has a pacemaker, the battery of which was replaced in December, and he reports that it was checked in early December. He has a history of sick sinus syndrome, which led to the pacemaker implantation approximately 14 years ago. No chest pain, shortness of breath, or palpitations are reported currently.  His medication history includes sertraline , which he had stopped taking after his last refill in July. This cessation coincided with a noticeable decline in his condition, but he has resumed the medication for the past two weeks, resulting in some improvement in his mood. He also mentions previous use of Eliquis  due to blood  clots and swelling issues, particularly on the right side.  He stopped the Eliquis  due to the high cost and perceived lack of benefit.  His sleep pattern is disrupted; he often sleeps during the day and is awake at night, leading to confusion about the time of day. He attempts to go to bed around 6 or 7 PM but frequently wakes up in the middle of the night and struggles to return to sleep. He sometimes sleeps through the day, missing phone calls and visits from neighbors.  Socially, he lives alone and has two neighbors who check on him regularly. He has a medical alert device, which he keeps charged.  He had previously considered moving into an independent living facility with an associated assisted living facility but decided not to due to the significant increase in monthly cost that would represent.  He has a son in [place]  who is concerned about his well-being.      Medications: Show/hide medication list[1]  Review of Systems  Constitutional:  Negative for chills and fever.  HENT:  Negative for congestion and sneezing.   Respiratory:  Negative for cough and shortness of breath.   Cardiovascular:  Negative for chest pain and palpitations.  Musculoskeletal:  Positive for arthralgias (knees bilaterally).  Neurological:  Positive for dizziness (with position changes) and weakness. Negative for headaches.        Objective    BP (!) 85/69 (BP Location: Left Arm, Cuff Size: Normal)  Pulse 86   Temp 97.8 F (36.6 C) (Oral)   Ht 5' 7 (1.702 m)   Wt 156 lb 1.6 oz (70.8 kg)   SpO2 98%   BMI 24.45 kg/m    Orthostatic vitals (1024, 1026, 1028) Vitals:   01/22/24 0947 01/22/24 1024 01/22/24 1026 01/22/24 1028  BP: (!) 149/81 (!) 153/94 (!) 134/93 (!) 85/69  Pulse: 72 73 70 86  Temp: 97.8 F (36.6 C)     TempSrc: Oral     SpO2: 98%     Weight: 156 lb 1.6 oz (70.8 kg)     Height: 5' 7 (1.702 m)       Physical Exam Vitals reviewed.  Constitutional:      General: He is not  in acute distress.    Appearance: Normal appearance. He is not diaphoretic.  HENT:     Head: Normocephalic and atraumatic.  Eyes:     General: No scleral icterus.    Conjunctiva/sclera: Conjunctivae normal.  Cardiovascular:     Rate and Rhythm: Normal rate and regular rhythm.     Pulses: Normal pulses.     Heart sounds: Normal heart sounds. No murmur heard. Pulmonary:     Effort: Pulmonary effort is normal. No respiratory distress.     Breath sounds: Normal breath sounds. No wheezing or rhonchi.  Musculoskeletal:     Cervical back: Neck supple.     Right lower leg: Edema (trace) present.     Left lower leg: No edema.  Lymphadenopathy:     Cervical: No cervical adenopathy.  Skin:    General: Skin is warm and dry.     Findings: Lesion (scabing on anterior left shin) present. No rash.  Neurological:     Mental Status: He is alert and oriented to person, place, and time. Mental status is at baseline.     Motor: Weakness (w/ambulation) present.  Psychiatric:        Mood and Affect: Mood normal.        Behavior: Behavior normal.      No results found for any visits on 01/22/24.  Assessment & Plan    Dizziness -     Comprehensive metabolic panel with GFR -     CBC with Differential/Platelet -     Orthostatic vital signs -     CT HEAD WO CONTRAST ( ); Future -     Ambulatory referral to Home Health  Weakness -     Comprehensive metabolic panel with GFR -     CBC with Differential/Platelet -     CT HEAD WO CONTRAST ( ); Future -     Ambulatory referral to Home Health  History of DVT (deep vein thrombosis)  Balance problem -     CT HEAD WO CONTRAST ( ); Future  History of syncope -     CT HEAD WO CONTRAST ( ); Future  Confusion  Unintentional weight loss  Failure to thrive in adult  At high risk for falls  Orthostatic hypotension -     Metoprolol  Succinate ER; Take 1 tablet (25 mg total) by mouth daily.  Dispense: 90 tablet; Refill: 1  Depression,  recurrent -     Sertraline  HCl; Take 1 tablet (50 mg total) by mouth daily.  Dispense: 90 tablet; Refill: 1  Artificial cardiac pacemaker -     Metoprolol  Succinate ER; Take 1 tablet (25 mg total) by mouth daily.  Dispense: 90 tablet; Refill: 1  Sick sinus syndrome (HCC) -     Metoprolol   Succinate ER; Take 1 tablet (25 mg total) by mouth daily.  Dispense: 90 tablet; Refill: 1     Dizziness; weakness; history of DVT; balance problem; history of syncope; confusion; unintentional weight loss; failure to thrive in adult; at high risk for falls Chronic dizziness and weakness with positional changes, improved mood and energy on sertraline , no recent falls, pacemaker functioning well.  - Ordered CT scan of the head. - Referred to home health for physical therapy and skilled nursing. - Performed orthostatic vital signs.  Orthostatic hypotension Positive orthostatic vital signs during visit.  Will discontinue valsartan  and discontinue extra half tablet of metoprolol .   Depression, recurrent Chronic.  Mood and energy improved after resuming sertraline , but low energy and disrupted sleep persist. - Continue sertraline . - Refill to avoid daytime napping.  Artificial cardiac pacemaker Pacemaker replaced in December, functioning well, not MRI compatible (due to the wires/leads).     Return in about 4 weeks (around 02/19/2024) for Recheck w/PCP if available, or Dr. Donzella if not.      I discussed the assessment and treatment plan with the patient  The patient was provided an opportunity to ask questions and all were answered. The patient agreed with the plan and demonstrated an understanding of the instructions.   The patient was advised to call back or seek an in-person evaluation if the symptoms worsen or if the condition fails to improve as anticipated.  Total time was 40 minutes. That includes chart review before the visit, the actual patient visit, and time spent on documentation after the  visit.     LAURAINE LOISE DONZELLA, DO  [facility] [phone] (phone) [phone] (fax)  [facility]    [1]  Outpatient Medications Prior to Visit  Medication Sig Note   traMADol  (ULTRAM ) 50 MG tablet TAKE ONE TABLET BY MOUTH TWICE A DAY AS NEEDED FOR PAIN    zolpidem  (AMBIEN ) 5 MG tablet TAKE ONE TABLET (5 MG TOTAL) BY MOUTH AT BEDTIME.    [DISCONTINUED] metoprolol  succinate (TOPROL -XL) 25 MG 24 hr tablet TAKE ONE AND ONE HALF (1/2) TABLETS (37.5 MG) BY MOUTH ONCE DAILY.    [DISCONTINUED] sertraline  (ZOLOFT ) 50 MG tablet TAKE ONE TABLET (50 MG TOTAL) BY MOUTH DAILY.    [DISCONTINUED] valsartan  (DIOVAN ) 40 MG tablet Take 1 tablet (40 mg total) by mouth daily. 01/22/2024: orthostatic hypotension   [DISCONTINUED] ELIQUIS  5 MG TABS tablet TAKE ONE TABLET BY MOUTH TWO TMES DAILY (Patient not taking: Reported on 12/27/2023) 01/22/2024: Too expensive   No facility-administered medications prior to visit.   "

## 2024-01-22 NOTE — Addendum Note (Signed)
 Addended byBETHA DONZELLA DOMINO on: 01/22/2024 03:25 PM   Modules accepted: Orders

## 2024-01-23 LAB — COMPREHENSIVE METABOLIC PANEL WITH GFR
ALT: 37 IU/L (ref 0–44)
AST: 39 IU/L (ref 0–40)
Albumin: 3.8 g/dL (ref 3.7–4.7)
Alkaline Phosphatase: 107 IU/L (ref 48–129)
BUN/Creatinine Ratio: 11 (ref 10–24)
BUN: 13 mg/dL (ref 8–27)
Bilirubin Total: 0.8 mg/dL (ref 0.0–1.2)
CO2: 24 mmol/L (ref 20–29)
Calcium: 9.4 mg/dL (ref 8.6–10.2)
Chloride: 104 mmol/L (ref 96–106)
Creatinine, Ser: 1.21 mg/dL (ref 0.76–1.27)
Globulin, Total: 1.8 g/dL (ref 1.5–4.5)
Glucose: 112 mg/dL — ABNORMAL HIGH (ref 70–99)
Potassium: 4.3 mmol/L (ref 3.5–5.2)
Sodium: 142 mmol/L (ref 134–144)
Total Protein: 5.6 g/dL — ABNORMAL LOW (ref 6.0–8.5)
eGFR: 58 mL/min/1.73 — ABNORMAL LOW

## 2024-01-23 LAB — CBC WITH DIFFERENTIAL/PLATELET
Basophils Absolute: 0 x10E3/uL (ref 0.0–0.2)
Basos: 1 %
EOS (ABSOLUTE): 0.3 x10E3/uL (ref 0.0–0.4)
Eos: 3 %
Hematocrit: 42.6 % (ref 37.5–51.0)
Hemoglobin: 14.6 g/dL (ref 13.0–17.7)
Immature Grans (Abs): 0 x10E3/uL (ref 0.0–0.1)
Immature Granulocytes: 0 %
Lymphocytes Absolute: 2 x10E3/uL (ref 0.7–3.1)
Lymphs: 23 %
MCH: 35.9 pg — ABNORMAL HIGH (ref 26.6–33.0)
MCHC: 34.3 g/dL (ref 31.5–35.7)
MCV: 105 fL — ABNORMAL HIGH (ref 79–97)
Monocytes Absolute: 0.9 x10E3/uL (ref 0.1–0.9)
Monocytes: 10 %
Neutrophils Absolute: 5.6 x10E3/uL (ref 1.4–7.0)
Neutrophils: 63 %
Platelets: 290 x10E3/uL (ref 150–450)
RBC: 4.07 x10E6/uL — ABNORMAL LOW (ref 4.14–5.80)
RDW: 12.6 % (ref 11.6–15.4)
WBC: 8.8 x10E3/uL (ref 3.4–10.8)

## 2024-01-30 ENCOUNTER — Telehealth: Payer: Self-pay

## 2024-01-30 NOTE — Telephone Encounter (Signed)
 If any mental status change or infectous symptoms needs acute appt.  For the months of increased falls, rec OV with PCP to review possible causes and make a plan.

## 2024-01-30 NOTE — Telephone Encounter (Signed)
 Attempted to call pt unable to lvm, mailbox is full.

## 2024-01-30 NOTE — Telephone Encounter (Signed)
 Copied from CRM #[id number]. Topic: Clinical - Medical Advice >> Jan 30, 2024  9:36 AM Nathanel BROCKS wrote: Reason for CRM: pt has been falling several time over the last couple of months, 2 was yesterday and ems was called. He refused to be transported. Family is concerned about what might be going on.

## 2024-02-01 ENCOUNTER — Telehealth: Payer: Self-pay

## 2024-02-01 NOTE — Telephone Encounter (Signed)
 Copied from CRM #[id number]. Topic: Clinical - Home Health Verbal Orders >> Feb 01, 2024  1:34 PM Emylou G wrote: Caller/Agency: Leita w/[facility]  Callback Number: [phone] secured line Service Requested: Physical Therapy Frequency: 2w3 1w4 Any new concerns about the patient? No

## 2024-02-01 NOTE — Telephone Encounter (Signed)
 Verbals given   E2C2 - if patient returns call please advise to disregard as his number was dialed by mistake. I do apologize for inconvience

## 2024-02-01 NOTE — Telephone Encounter (Signed)
 Ok for verbal orders.    Rockie Agent, MD, DABOM [facility]

## 2024-02-06 ENCOUNTER — Telehealth: Payer: Self-pay

## 2024-02-06 NOTE — Telephone Encounter (Unsigned)
 Copied from CRM #[id number]. Topic: Clinical - Home Health Verbal Orders >> Feb 02, 2024  4:41 PM Gattis SQUIBB wrote: Leita, with [facility],  Missed home health visit today.

## 2024-02-06 NOTE — Telephone Encounter (Signed)
 PT Telephone encounter reviewed.

## 2024-02-07 ENCOUNTER — Ambulatory Visit: Payer: Self-pay | Admitting: Family Medicine

## 2024-02-11 ENCOUNTER — Other Ambulatory Visit: Payer: Self-pay

## 2024-02-11 ENCOUNTER — Emergency Department

## 2024-02-11 ENCOUNTER — Observation Stay
Admission: EM | Admit: 2024-02-11 | Source: Home / Self Care | Attending: Internal Medicine | Admitting: Internal Medicine

## 2024-02-11 DIAGNOSIS — Z950 Presence of cardiac pacemaker: Secondary | ICD-10-CM

## 2024-02-11 DIAGNOSIS — I10 Essential (primary) hypertension: Secondary | ICD-10-CM | POA: Diagnosis not present

## 2024-02-11 DIAGNOSIS — I951 Orthostatic hypotension: Secondary | ICD-10-CM

## 2024-02-11 DIAGNOSIS — M170 Bilateral primary osteoarthritis of knee: Secondary | ICD-10-CM

## 2024-02-11 DIAGNOSIS — F1010 Alcohol abuse, uncomplicated: Secondary | ICD-10-CM | POA: Insufficient documentation

## 2024-02-11 DIAGNOSIS — I495 Sick sinus syndrome: Secondary | ICD-10-CM

## 2024-02-11 DIAGNOSIS — G9340 Encephalopathy, unspecified: Secondary | ICD-10-CM | POA: Diagnosis not present

## 2024-02-11 DIAGNOSIS — F339 Major depressive disorder, recurrent, unspecified: Secondary | ICD-10-CM | POA: Diagnosis not present

## 2024-02-11 DIAGNOSIS — G4700 Insomnia, unspecified: Secondary | ICD-10-CM

## 2024-02-11 DIAGNOSIS — R4182 Altered mental status, unspecified: Principal | ICD-10-CM

## 2024-02-11 LAB — PROTIME-INR
INR: 1 (ref 0.8–1.2)
Prothrombin Time: 13.3 s (ref 11.4–15.2)

## 2024-02-11 LAB — CBC
HCT: 43.5 % (ref 39.0–52.0)
Hemoglobin: 15.1 g/dL (ref 13.0–17.0)
MCH: 34.8 pg — ABNORMAL HIGH (ref 26.0–34.0)
MCHC: 34.7 g/dL (ref 30.0–36.0)
MCV: 100.2 fL — ABNORMAL HIGH (ref 80.0–100.0)
Platelets: 344 10*3/uL (ref 150–400)
RBC: 4.34 MIL/uL (ref 4.22–5.81)
RDW: 12 % (ref 11.5–15.5)
WBC: 13 10*3/uL — ABNORMAL HIGH (ref 4.0–10.5)
nRBC: 0 % (ref 0.0–0.2)

## 2024-02-11 LAB — COMPREHENSIVE METABOLIC PANEL WITH GFR
ALT: 23 U/L (ref 0–44)
AST: 31 U/L (ref 15–41)
Albumin: 3.9 g/dL (ref 3.5–5.0)
Alkaline Phosphatase: 110 U/L (ref 38–126)
Anion gap: 11 (ref 5–15)
BUN: 15 mg/dL (ref 8–23)
CO2: 26 mmol/L (ref 22–32)
Calcium: 9 mg/dL (ref 8.9–10.3)
Chloride: 102 mmol/L (ref 98–111)
Creatinine, Ser: 1.22 mg/dL (ref 0.61–1.24)
GFR, Estimated: 58 mL/min — ABNORMAL LOW
Glucose, Bld: 100 mg/dL — ABNORMAL HIGH (ref 70–99)
Potassium: 4.9 mmol/L (ref 3.5–5.1)
Sodium: 139 mmol/L (ref 135–145)
Total Bilirubin: 0.8 mg/dL (ref 0.0–1.2)
Total Protein: 6.5 g/dL (ref 6.5–8.1)

## 2024-02-11 LAB — AMMONIA: Ammonia: 13 umol/L (ref 9–35)

## 2024-02-11 LAB — DIFFERENTIAL
Abs Immature Granulocytes: 0.07 10*3/uL (ref 0.00–0.07)
Basophils Absolute: 0.1 10*3/uL (ref 0.0–0.1)
Basophils Relative: 0 %
Eosinophils Absolute: 0.2 10*3/uL (ref 0.0–0.5)
Eosinophils Relative: 1 %
Immature Granulocytes: 1 %
Lymphocytes Relative: 12 %
Lymphs Abs: 1.6 10*3/uL (ref 0.7–4.0)
Monocytes Absolute: 1 10*3/uL (ref 0.1–1.0)
Monocytes Relative: 8 %
Neutro Abs: 10 10*3/uL — ABNORMAL HIGH (ref 1.7–7.7)
Neutrophils Relative %: 78 %

## 2024-02-11 LAB — APTT: aPTT: 26 s (ref 24–36)

## 2024-02-11 LAB — ETHANOL: Alcohol, Ethyl (B): <15 mg/dL

## 2024-02-11 LAB — MAGNESIUM: Magnesium: 1.6 mg/dL — ABNORMAL LOW (ref 1.7–2.4)

## 2024-02-11 LAB — CK: Total CK: 38 U/L — ABNORMAL LOW (ref 49–397)

## 2024-02-11 LAB — CBG MONITORING, ED: Glucose-Capillary: 110 mg/dL — ABNORMAL HIGH (ref 70–99)

## 2024-02-11 MED ORDER — MAGNESIUM HYDROXIDE 400 MG/5ML PO SUSP: 30.0000 mL | Freq: Every day | ORAL | Status: AC | PRN

## 2024-02-11 MED ORDER — ONDANSETRON HCL 4 MG PO TABS: 4.0000 mg | ORAL_TABLET | Freq: Four times a day (QID) | ORAL | Status: AC | PRN

## 2024-02-11 MED ORDER — LORAZEPAM 2 MG/ML IJ SOLN
1.0000 mg | INTRAMUSCULAR | Status: AC | PRN
  Administered 2024-02-14: 1 mg via INTRAVENOUS
  Filled 2024-02-11: qty 1

## 2024-02-11 MED ORDER — MAGNESIUM SULFATE 2 GM/50ML IV SOLN
2.0000 g | Freq: Once | INTRAVENOUS | Status: AC
  Administered 2024-02-12: 2 g via INTRAVENOUS
  Filled 2024-02-11: qty 50

## 2024-02-11 MED ORDER — MAGNESIUM SULFATE 50 % IJ SOLN: 3.0000 g | Freq: Once | INTRAVENOUS | Status: DC

## 2024-02-11 MED ORDER — THIAMINE HCL 100 MG/ML IJ SOLN
100.0000 mg | Freq: Every day | INTRAMUSCULAR | Status: DC
  Administered 2024-02-14: 100 mg via INTRAVENOUS
  Filled 2024-02-11 (×2): qty 2

## 2024-02-11 MED ORDER — SODIUM CHLORIDE 0.9% FLUSH: 3.0000 mL | Freq: Once | INTRAVENOUS | Status: AC

## 2024-02-11 MED ORDER — THIAMINE HCL 100 MG/ML IJ SOLN
500.0000 mg | Freq: Once | INTRAVENOUS | Status: AC
  Administered 2024-02-11: 500 mg via INTRAVENOUS
  Filled 2024-02-11: qty 5

## 2024-02-11 MED ORDER — ENOXAPARIN SODIUM 40 MG/0.4ML IJ SOSY
40.0000 mg | PREFILLED_SYRINGE | INTRAMUSCULAR | Status: AC
  Administered 2024-02-13 – 2024-02-16 (×4): 40 mg via SUBCUTANEOUS
  Filled 2024-02-11 (×5): qty 0.4

## 2024-02-11 MED ORDER — ONDANSETRON HCL 4 MG/2ML IJ SOLN: 4.0000 mg | Freq: Four times a day (QID) | INTRAMUSCULAR | Status: AC | PRN

## 2024-02-11 MED ORDER — METOPROLOL SUCCINATE ER 50 MG PO TB24
25.0000 mg | ORAL_TABLET | Freq: Every day | ORAL | Status: AC
  Administered 2024-02-13 – 2024-02-16 (×4): 25 mg via ORAL
  Filled 2024-02-11 (×4): qty 1

## 2024-02-11 MED ORDER — ACETAMINOPHEN 325 MG PO TABS
650.0000 mg | ORAL_TABLET | Freq: Four times a day (QID) | ORAL | Status: AC | PRN
  Administered 2024-02-15 – 2024-02-16 (×2): 650 mg via ORAL
  Filled 2024-02-11 (×2): qty 2

## 2024-02-11 MED ORDER — LORAZEPAM 1 MG PO TABS
1.0000 mg | ORAL_TABLET | ORAL | Status: AC | PRN
  Administered 2024-02-12 – 2024-02-14 (×2): 1 mg via ORAL
  Filled 2024-02-11 (×2): qty 1

## 2024-02-11 MED ORDER — ZOLPIDEM TARTRATE 5 MG PO TABS: 5.0000 mg | ORAL_TABLET | Freq: Every evening | ORAL | Status: DC | PRN

## 2024-02-11 MED ORDER — SODIUM CHLORIDE 0.9 % IV SOLN: INTRAVENOUS | Status: AC

## 2024-02-11 MED ORDER — THIAMINE MONONITRATE 100 MG PO TABS
100.0000 mg | ORAL_TABLET | Freq: Every day | ORAL | Status: DC
  Administered 2024-02-13 – 2024-02-16 (×3): 100 mg via ORAL
  Filled 2024-02-11 (×4): qty 1

## 2024-02-11 MED ORDER — MAGNESIUM SULFATE IN D5W 1-5 GM/100ML-% IV SOLN
1.0000 g | Freq: Once | INTRAVENOUS | Status: AC
  Administered 2024-02-12: 1 g via INTRAVENOUS
  Filled 2024-02-11: qty 100

## 2024-02-11 MED ORDER — TRAMADOL HCL 50 MG PO TABS: 50.0000 mg | ORAL_TABLET | Freq: Two times a day (BID) | ORAL | Status: AC | PRN

## 2024-02-11 MED ORDER — ADULT MULTIVITAMIN W/MINERALS CH
1.0000 | ORAL_TABLET | Freq: Every day | ORAL | Status: AC
  Administered 2024-02-13 – 2024-02-16 (×4): 1 via ORAL
  Filled 2024-02-11 (×4): qty 1

## 2024-02-11 MED ORDER — FOLIC ACID 1 MG PO TABS
1.0000 mg | ORAL_TABLET | Freq: Every day | ORAL | Status: AC
  Administered 2024-02-13 – 2024-02-16 (×4): 1 mg via ORAL
  Filled 2024-02-11 (×4): qty 1

## 2024-02-11 MED ORDER — SERTRALINE HCL 50 MG PO TABS
50.0000 mg | ORAL_TABLET | Freq: Every day | ORAL | Status: AC
  Administered 2024-02-13 – 2024-02-16 (×4): 50 mg via ORAL
  Filled 2024-02-11 (×4): qty 1

## 2024-02-11 MED ORDER — ACETAMINOPHEN 650 MG RE SUPP: 650.0000 mg | Freq: Four times a day (QID) | RECTAL | Status: AC | PRN

## 2024-02-11 NOTE — Assessment & Plan Note (Signed)
-   The patient will be admitted to a medical telemetry observation bed. - Will follow neurochecks every 4 hours for 24 hours. -Noncontrast head CT scan revealed no acute intracranial abnormality. - This could be related to Wernicke's encephalopathy given history of alcohol abuse. - The patient will be placed on continuous thiamine  and CIWA protocol.

## 2024-02-11 NOTE — Assessment & Plan Note (Signed)
Will continue Toprol-XL.

## 2024-02-11 NOTE — ED Notes (Signed)
 Requested med from pharmacy

## 2024-02-11 NOTE — ED Triage Notes (Signed)
 Pt to ed from home via [facility] for AMS.  Pt is cao to self and place only. Found on floor by his neighbor. Last known well was about 4 pm yesterday. HX of ETOH use. Hasn't had any in 5 days possibly.   160/94 88 HR  96% RA  BGL 135

## 2024-02-11 NOTE — H&P (Signed)
 "     [facility]   PATIENT NAME: Wesley Small    MR#:  [id number]  DATE OF BIRTH:  1935-10-20  DATE OF ADMISSION:  02/11/2024  PRIMARY CARE PHYSICIAN: Sharma Coyer, MD   Patient is coming from: Home   REQUESTING/REFERRING PHYSICIAN: Fairy Dunnings, MD  CHIEF COMPLAINT:   Chief Complaint  Patient presents with   Altered Mental Status    HISTORY OF PRESENT ILLNESS:  Wesley Small is a 89 y.o. Caucasian male with medical history significant for osteoarthritis, GERD, possible alcohol abuse, SSS status post pacemaker placement, OSA and essential hypertension, presented to the emergency room with acute onset of altered mental status with confusion.  The patient was found on the floor by his neighbor today per EMS and appeared to be confused after he was last seen around 4 PM yesterday.  He drinks alcohol on a regular basis but could not remember when his last drink was.  He denied any chest pain or palpitations.  No cough or wheezing or dyspnea.  No nausea or vomiting or abdominal pain.  No dysuria, oliguria or hematuria or flank pain.  No paresthesias or focal muscle weakness.  ED Course: When the patient came to the ER, BP was 158/76 with otherwise normal vital signs.  Labs revealed unremarkable CMP.  Magnesium  was 1.6 and ammonia levels 13 CBC showed leukocytosis 13 with neutrophilia and macrocytosis.  Alcohol level was less than 15. EKG as reviewed by me : EKG showed atrial fibrillation with EKG showed likely paced rhythm with a rate of 65 with left axis deviation and right bundle branch block with minimal voltage criteria for LVH. Imaging: None contrasted head CT scan showed no acute intracranial normality.  The patient was given 500 mg of IV thiamine .  He will be admitted to a medical telemetry observation bed for further evaluation and management. PAST MEDICAL HISTORY:   Past Medical History:  Diagnosis Date   Arthritis    Dysrhythmia    RBBB   GERD  (gastroesophageal reflux disease)    History of blood transfusion    History of diverticulitis    History of hiatal hernia    HOH (hard of hearing)    Hypercholesterolemia    Hypertension    Presence of permanent cardiac pacemaker    Sleep apnea     PAST SURGICAL HISTORY:   Past Surgical History:  Procedure Laterality Date   back injection     CATARACT EXTRACTION W/PHACO Right 11/24/2016   Procedure: CATARACT EXTRACTION PHACO AND INTRAOCULAR LENS PLACEMENT (IOC);  Surgeon: Myrna Adine Anes, MD;  Location: [facility] ORS;  Service: Ophthalmology;  Laterality: Right;  Lot # [id number] H US :   00.31  AP%: 10.9 CDE:  3.39   CATARACT EXTRACTION W/PHACO Left 12/29/2016   Procedure: CATARACT EXTRACTION PHACO AND INTRAOCULAR LENS PLACEMENT (IOC);  Surgeon: Myrna Adine Anes, MD;  Location: [facility] ORS;  Service: Ophthalmology;  Laterality: Left;  US  00:33.8 AP% 8.5 CDE 2.86 Fluid Pack lot # [id number]   INJECTION KNEE     INJECTION KNEE     PACEMAKER INSERTION     PPM GENERATOR CHANGEOUT N/A 09/21/2023   Procedure: PPM GENERATOR CHANGEOUT;  Surgeon: Ammon Blunt, MD;  Location: [facility];  Service: Cardiovascular;  Laterality: N/A;   SKIN GRAFT     TONSILLECTOMY AND ADENOIDECTOMY     TRANSURETHRAL RESECTION OF PROSTATE      SOCIAL HISTORY:   Social History   Tobacco Use   Smoking status:  Former    Current packs/day: 0.00    Average packs/day: 1 pack/day for 9.0 years (9.0 ttl pk-yrs)    Types: Cigarettes    Start date: 9    Quit date: 1965    Years since quitting: 61.1   Smokeless tobacco: Never   Tobacco comments:    quit around 47 (89 yo)  Substance Use Topics   Alcohol use: Yes    Alcohol/week: 2.0 standard drinks of alcohol    Types: 2 Shots of liquor per week    Comment: 2 2oz high ball bourbon shots    FAMILY HISTORY:   Family History  Problem Relation Age of Onset   Heart disease Mother        pacemaker   Depression Mother    Heart disease  Father     DRUG ALLERGIES:  Allergies[1]  REVIEW OF SYSTEMS:   ROS As per history of present illness. All pertinent systems were reviewed above. Constitutional, HEENT, cardiovascular, respiratory, GI, GU, musculoskeletal, neuro, psychiatric, endocrine, integumentary and hematologic systems were reviewed and are otherwise negative/unremarkable except for positive findings mentioned above in the HPI.   MEDICATIONS AT HOME:   Prior to Admission medications  Medication Sig Start Date End Date Taking? Authorizing Provider  metoprolol  succinate (TOPROL -XL) 25 MG 24 hr tablet Take 1 tablet (25 mg total) by mouth daily. 01/22/24   Donzella Lauraine SAILOR, DO  sertraline  (ZOLOFT ) 50 MG tablet Take 1 tablet (50 mg total) by mouth daily. 01/22/24   Donzella Lauraine SAILOR, DO  traMADol  (ULTRAM ) 50 MG tablet TAKE ONE TABLET BY MOUTH TWICE A DAY AS NEEDED FOR PAIN 12/18/23   Simmons-Robinson, Rockie, MD  zolpidem  (AMBIEN ) 5 MG tablet TAKE ONE TABLET (5 MG TOTAL) BY MOUTH AT BEDTIME. 11/13/23   Simmons-Robinson, Makiera, MD      VITAL SIGNS:  Blood pressure 136/76, pulse 63, temperature 97.9 F (36.6 C), temperature source Oral, resp. rate 16, height 5' 7 (1.702 m), SpO2 98%.  PHYSICAL EXAMINATION:  Physical Exam  GENERAL:  89 y.o.-year-old Caucasian male patient lying in the bed with no acute distress.  He is globally confused. EYES: Pupils equal, round, reactive to light and accommodation. No scleral icterus. Extraocular muscles intact.  HEENT: Head atraumatic, normocephalic. Oropharynx and nasopharynx clear.  NECK:  Supple, no jugular venous distention. No thyroid  enlargement, no tenderness.  LUNGS: Normal breath sounds bilaterally, no wheezing, rales,rhonchi or crepitation. No use of accessory muscles of respiration.  CARDIOVASCULAR: Regular rate and rhythm, S1, S2 normal. No murmurs, rubs, or gallops.  ABDOMEN: Soft, nondistended, nontender. Bowel sounds present. No organomegaly or mass.  EXTREMITIES: No  pedal edema, cyanosis, or clubbing.  NEUROLOGIC: Cranial nerves II through XII are intact. Muscle strength 5/5 in all extremities. Sensation intact. Gait not checked.  PSYCHIATRIC: The patient is alert and oriented x 1 to his name.  Normal affect and good eye contact. SKIN: No obvious rash, lesion, or ulcer.   LABORATORY PANEL:   CBC Recent Labs  Lab 02/11/24 1740  WBC 13.0*  HGB 15.1  HCT 43.5  PLT 344   ------------------------------------------------------------------------------------------------------------------  Chemistries  Recent Labs  Lab 02/11/24 1740 02/11/24 2055  NA 139  --   K 4.9  --   CL 102  --   CO2 26  --   GLUCOSE 100*  --   BUN 15  --   CREATININE 1.22  --   CALCIUM  9.0  --   MG  --  1.6*  AST 31  --  ALT 23  --   ALKPHOS 110  --   BILITOT 0.8  --    ------------------------------------------------------------------------------------------------------------------  Cardiac Enzymes No results for input(s): TROPONINI in the last 168 hours. ------------------------------------------------------------------------------------------------------------------  RADIOLOGY:  CT HEAD WO CONTRAST Result Date: 02/11/2024 EXAM: CT HEAD WITHOUT CONTRAST 02/11/2024 07:40:24 PM TECHNIQUE: CT of the head was performed without the administration of intravenous contrast. Automated exposure control, iterative reconstruction, and/or weight based adjustment of the mA/kV was utilized to reduce the radiation dose to as low as reasonably achievable. COMPARISON: 01/22/2024 CLINICAL HISTORY: Acute neurological deficit; stroke suspected. FINDINGS: BRAIN AND VENTRICLES: No acute hemorrhage. No evidence of acute infarct. Patchy and confluent areas of decreased attenuation throughout the deep and periventricular white matter of the cerebral hemispheres bilaterally, compatible with chronic microvascular ischemic disease. Cerebral ventricle sizes concordant with degree of cerebral  volume loss. No hydrocephalus. No extra-axial collection. No mass effect or midline shift. Atherosclerotic calcifications within the cavernous internal carotid arteries. ORBITS: Bilateral lens replacement. SINUSES: No acute abnormality. SOFT TISSUES AND SKULL: No acute soft tissue abnormality. No skull fracture. IMPRESSION: 1. No acute intracranial abnormality. Electronically signed by: Norman Gatlin MD 02/11/2024 07:47 PM EST RP Workstation: [id number]      IMPRESSION AND PLAN:  Assessment and Plan: * Acute encephalopathy - The patient will be admitted to a medical telemetry observation bed. - Will follow neurochecks every 4 hours for 24 hours. -Noncontrast head CT scan revealed no acute intracranial abnormality. - This could be related to Wernicke's encephalopathy given history of alcohol abuse. - The patient will be placed on continuous thiamine  and CIWA protocol.  Essential hypertension - Will continue Toprol -XL.  Hypomagnesemia Magnesium  will be replaced.  Alcohol abuse - Management as above. - The patient will be placed on multivitamins, folate and thiamine  daily.  Depression, recurrent - Will continue Zoloft .    DVT prophylaxis: Lovenox .  Advanced Care Planning:  Code Status: full code.  Family Communication:  The plan of care was discussed in details with the patient (and family). I answered all questions. The patient agreed to proceed with the above mentioned plan. Further management will depend upon hospital course. Disposition Plan: Back to previous home environment Consults called: none. All the records are reviewed and case discussed with ED provider.  Status is: Observation  I certify that at the time of admission, it is my clinical judgment that the patient will require hospital care extending less than 2 midnights.                            Dispo: The patient is from: Home              Anticipated d/c is to: Home              Patient currently is not  medically stable to d/c.              Difficult to place patient: No  Madison DELENA Peaches M.D on 02/11/2024 at 11:28 PM  [facility]   From 7 PM-7 AM, contact night-coverage [email or web address]  CC: Primary care physician; Sharma Coyer, MD     [1]  Allergies Allergen Reactions   Penicillins Rash and Other (See Comments)    Has patient had a PCN reaction causing immediate rash, facial/tongue/throat swelling, SOB or lightheadedness with hypotension: Yes Has patient had a PCN reaction causing severe rash involving mucus membranes or skin necrosis: No  Has patient had a PCN reaction  that required hospitalization: No Has patient had a PCN reaction occurring within the last 10 years: No If all of the above answers are NO, then may proceed with Cephalosporin use.    "

## 2024-02-11 NOTE — Assessment & Plan Note (Signed)
Will continue Zoloft. 

## 2024-02-11 NOTE — Assessment & Plan Note (Signed)
-   Management as above. - The patient will be placed on multivitamins, folate and thiamine  daily.

## 2024-02-11 NOTE — ED Notes (Signed)
 Provided patient with warm blanket

## 2024-02-11 NOTE — ED Notes (Signed)
 Fall bundle in place, bed alarm activated

## 2024-02-11 NOTE — ED Notes (Signed)
 Neighbor Vilinda [place]): [phone] states he will return if and when patient is discharged. Patients' son is out of town in [place] .

## 2024-02-11 NOTE — ED Provider Notes (Signed)
 "  [facility] Provider Note    Event Date/Time   First MD Initiated Contact with Patient 02/11/24 1941     (approximate)   History   Chief Complaint Altered Mental Status   HPI  Wesley Small is a 89 y.o. male with past medical history of hypertension, sick sinus syndrome status post pacemaker, DVT, GERD, and alcohol abuse who presents to the ED for altered mental status.  Per EMS, patient was found on the floor by his neighbor today appearing confused after he was last seen around 4 PM yesterday.  EMS reports that patient drinks alcohol regularly but it is unclear when his last drink was.  Patient admits to alcohol consumption on a regular basis, but is also unsure what when his last drink was.  He is not sure why he is here and currently denies any complaints.     Physical Exam   Triage Vital Signs: ED Triage Vitals  Encounter Vitals Group     BP 02/11/24 1737 (!) 153/84     Girls Systolic BP Percentile --      Girls Diastolic BP Percentile --      Boys Systolic BP Percentile --      Boys Diastolic BP Percentile --      Pulse Rate 02/11/24 1737 66     Resp 02/11/24 1737 16     Temp 02/11/24 1737 97.6 F (36.4 C)     Temp Source 02/11/24 1737 Oral     SpO2 02/11/24 1737 98 %     Weight --      Height 02/11/24 1738 5' 7 (1.702 m)     Head Circumference --      Peak Flow --      Pain Score 02/11/24 1956 0     Pain Loc --      Pain Education --      Exclude from Growth Chart --     Most recent vital signs: Vitals:   02/11/24 1956 02/11/24 2158  BP: (!) 158/76 136/76  Pulse: 61 63  Resp: 16 16  Temp:  97.9 F (36.6 C)  SpO2: 97% 98%    Constitutional: Alert and oriented to person, place, and time but not situation. Eyes: Conjunctivae are normal.  Pupils equal, round, and reactive to light bilaterally. Head: Atraumatic. Nose: No congestion/rhinnorhea. Mouth/Throat: Mucous membranes are moist.  Neck: No midline cervical spine  tenderness to palpation. Cardiovascular: Normal rate, regular rhythm. Grossly normal heart sounds.  2+ radial pulses bilaterally. Respiratory: Normal respiratory effort.  No retractions. Lungs CTAB. Gastrointestinal: Soft and nontender. No distention. Musculoskeletal: No lower extremity tenderness nor edema.  Neurologic:  Normal speech and language. No gross focal neurologic deficits are appreciated.  No tongue fasciculations or tremors noted.    ED Results / Procedures / Treatments   Labs (all labs ordered are listed, but only abnormal results are displayed) Labs Reviewed  CBC - Abnormal; Notable for the following components:      Result Value   WBC 13.0 (*)    MCV 100.2 (*)    MCH 34.8 (*)    All other components within normal limits  DIFFERENTIAL - Abnormal; Notable for the following components:   Neutro Abs 10.0 (*)    All other components within normal limits  COMPREHENSIVE METABOLIC PANEL WITH GFR - Abnormal; Notable for the following components:   Glucose, Bld 100 (*)    GFR, Estimated 58 (*)    All other components within  normal limits  CK - Abnormal; Notable for the following components:   Total CK 38 (*)    All other components within normal limits  MAGNESIUM  - Abnormal; Notable for the following components:   Magnesium  1.6 (*)    All other components within normal limits  CBG MONITORING, ED - Abnormal; Notable for the following components:   Glucose-Capillary 110 (*)    All other components within normal limits  PROTIME-INR  APTT  ETHANOL  AMMONIA  URINALYSIS, ROUTINE W REFLEX MICROSCOPIC  BASIC METABOLIC PANEL WITH GFR  CBC     EKG  ED ECG REPORT I, Carlin Palin, the attending physician, personally viewed and interpreted this ECG.   Date: 02/11/2024  EKG Time: 17:46  Rate: 65  Rhythm: Ventricular paced  Axis: LAD  Intervals:nonspecific intraventricular conduction delay  ST&T Change: None  RADIOLOGY CT head reviewed and interpreted by me with  no hemorrhage or midline shift.  PROCEDURES:  Critical Care performed: No  Procedures   MEDICATIONS ORDERED IN ED: Medications  sodium chloride  flush (NS) 0.9 % injection 3 mL (3 mLs Intravenous Not Given 02/11/24 1808)  traMADol  (ULTRAM ) tablet 50 mg (has no administration in time range)  metoprolol  succinate (TOPROL -XL) 24 hr tablet 25 mg (has no administration in time range)  sertraline  (ZOLOFT ) tablet 50 mg (has no administration in time range)  zolpidem  (AMBIEN ) tablet 5 mg (has no administration in time range)  enoxaparin  (LOVENOX ) injection 40 mg (has no administration in time range)  0.9 %  sodium chloride  infusion (has no administration in time range)  acetaminophen  (TYLENOL ) tablet 650 mg (has no administration in time range)    Or  acetaminophen  (TYLENOL ) suppository 650 mg (has no administration in time range)  magnesium  hydroxide (MILK OF MAGNESIA) suspension 30 mL (has no administration in time range)  ondansetron  (ZOFRAN ) tablet 4 mg (has no administration in time range)    Or  ondansetron  (ZOFRAN ) injection 4 mg (has no administration in time range)  thiamine  (VITAMIN B1) 500 mg in sodium chloride  0.9 % 50 mL IVPB (0 mg Intravenous Stopped 02/11/24 2247)     IMPRESSION / MDM / ASSESSMENT AND PLAN / ED COURSE  I reviewed the triage vital signs and the nursing notes.                              89 y.o. male with past medical history of hypertension, sick sinus syndrome status post pacemaker, DVT, GERD, and alcohol abuse who presents to the ED for altered mental status after being found confused by his neighbor today.  Patient's presentation is most consistent with acute presentation with potential threat to life or bodily function.  Differential diagnosis includes, but is not limited to, stroke, TIA, hypoglycemia, anemia, electrolyte abnormality, AKI, alcohol withdrawal, alcohol intoxication, Warnicke's encephalopathy, hepatic encephalopathy.  Patient  nontoxic-appearing and in no acute distress, vital signs are unremarkable.  He is awake and alert but disoriented, has a nonfocal neurologic exam.  CT head is negative for acute process, no evidence of traumatic injury.  He does not appear to have any signs of alcohol withdrawal, however I am concerned for Warnicke's encephalopathy and we will give dose of IV thiamine .  Labs without significant anemia, leukocytosis, electrolyte abnormality, or AKI.  LFTs are unremarkable, will add on magnesium  level, but ammonia within normal limits.  Case discussed with hospitalist for admission.      FINAL CLINICAL IMPRESSION(S) / ED DIAGNOSES  Final diagnoses:  Altered mental status, unspecified altered mental status type  Alcohol abuse     Rx / DC Orders   ED Discharge Orders     None        Note:  This document was prepared using Dragon voice recognition software and may include unintentional dictation errors.   Willo Dunnings, MD 02/11/24 2319  "

## 2024-02-11 NOTE — Assessment & Plan Note (Signed)
-   Magnesium will be replaced.

## 2024-02-12 DIAGNOSIS — G9340 Encephalopathy, unspecified: Secondary | ICD-10-CM | POA: Diagnosis not present

## 2024-02-12 LAB — CBC
HCT: 38.3 % — ABNORMAL LOW (ref 39.0–52.0)
Hemoglobin: 13.1 g/dL (ref 13.0–17.0)
MCH: 34.4 pg — ABNORMAL HIGH (ref 26.0–34.0)
MCHC: 34.2 g/dL (ref 30.0–36.0)
MCV: 100.5 fL — ABNORMAL HIGH (ref 80.0–100.0)
Platelets: 240 10*3/uL (ref 150–400)
RBC: 3.81 MIL/uL — ABNORMAL LOW (ref 4.22–5.81)
RDW: 12 % (ref 11.5–15.5)
WBC: 12.5 10*3/uL — ABNORMAL HIGH (ref 4.0–10.5)
nRBC: 0 % (ref 0.0–0.2)

## 2024-02-12 LAB — URINALYSIS, ROUTINE W REFLEX MICROSCOPIC
Bilirubin Urine: NEGATIVE
Glucose, UA: NEGATIVE mg/dL
Hgb urine dipstick: NEGATIVE
Ketones, ur: 5 mg/dL — AB
Leukocytes,Ua: NEGATIVE
Nitrite: NEGATIVE
Protein, ur: NEGATIVE mg/dL
Specific Gravity, Urine: 1.017 (ref 1.005–1.030)
pH: 5 (ref 5.0–8.0)

## 2024-02-12 LAB — VITAMIN B12: Vitamin B-12: 444 pg/mL (ref 180–914)

## 2024-02-12 LAB — BASIC METABOLIC PANEL WITH GFR
Anion gap: 8 (ref 5–15)
BUN: 16 mg/dL (ref 8–23)
CO2: 28 mmol/L (ref 22–32)
Calcium: 8.6 mg/dL — ABNORMAL LOW (ref 8.9–10.3)
Chloride: 103 mmol/L (ref 98–111)
Creatinine, Ser: 1.21 mg/dL (ref 0.61–1.24)
GFR, Estimated: 58 mL/min — ABNORMAL LOW
Glucose, Bld: 95 mg/dL (ref 70–99)
Potassium: 4.6 mmol/L (ref 3.5–5.1)
Sodium: 139 mmol/L (ref 135–145)

## 2024-02-12 LAB — FOLATE: Folate: <3 ng/mL — ABNORMAL LOW

## 2024-02-12 LAB — MAGNESIUM: Magnesium: 2.6 mg/dL — ABNORMAL HIGH (ref 1.7–2.4)

## 2024-02-12 MED ORDER — CHLORDIAZEPOXIDE HCL 25 MG PO CAPS
25.0000 mg | ORAL_CAPSULE | Freq: Four times a day (QID) | ORAL | Status: DC
  Administered 2024-02-12 – 2024-02-14 (×7): 25 mg via ORAL
  Filled 2024-02-12 (×8): qty 1

## 2024-02-12 NOTE — ED Notes (Signed)
 Attemped to collect UA x3

## 2024-02-12 NOTE — ED Notes (Signed)
 Pt dropped urine on bathroom floor. EVS called

## 2024-02-12 NOTE — ED Notes (Signed)
 Pt refused to lay down in bed and be connected to monitors. Pt educated on importance of monitoring vital signs while at the hospital. Pt refused.

## 2024-02-12 NOTE — ED Notes (Signed)
MD Ayiku at bedside. 

## 2024-02-12 NOTE — ED Notes (Signed)
 Pt found walking outside of his room towards the exit. Pt redirected to his room by this RN. Pt is irritated and refused to get back in bed. Pt currently sitting on the chair in the room. MD Ayiku notified and would see pt as soon as possible.

## 2024-02-12 NOTE — ED Notes (Signed)
 Bed alarm, socks and fall risk bracelet on. Stretcher locked and in lowest position. Call bell within reach

## 2024-02-12 NOTE — ED Notes (Signed)
 This tech sitting with pt at this time.

## 2024-02-12 NOTE — ED Notes (Signed)
 PT  PLACED  UNDER  IVC PAPERS

## 2024-02-12 NOTE — Discharge Instructions (Signed)

## 2024-02-13 ENCOUNTER — Inpatient Hospital Stay

## 2024-02-13 DIAGNOSIS — G9340 Encephalopathy, unspecified: Secondary | ICD-10-CM | POA: Diagnosis not present

## 2024-02-13 MED ORDER — LEVOFLOXACIN IN D5W 250 MG/50ML IV SOLN
250.0000 mg | INTRAVENOUS | Status: DC
  Administered 2024-02-13: 250 mg via INTRAVENOUS
  Filled 2024-02-13 (×2): qty 50

## 2024-02-13 NOTE — Plan of Care (Signed)
   Problem: Clinical Measurements: Goal: Ability to maintain clinical measurements within normal limits will improve Outcome: Progressing
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
## 2024-02-13 NOTE — Progress Notes (Signed)
"  ° °      CROSS COVER NOTE  NAME: KENZEL RUESCH MRN: [id number] DOB : July 02, 1935 ATTENDING PHYSICIAN: Jens Durand, MD    Date of Service   02/13/2024   HPI/Events of Note   Subjective:  Message received from RN reporting patient began choking on midnight med and vomited with concern for aspiration.   Interventions   Assessment/Plan: #Aspiration Levofloxacin ; patient has PCN allergy NPO SLP- Swallow Study        To reach the provider On-Call:   7AM- 7PM see care teams to locate the attending and reach out to them via [email or web address]. Password: [email or web address] 7PM-7AM contact night-coverage If you still have difficulty reaching the appropriate provider, please page the [facility] (Director on Call) for [facility] on amion for assistance  This document was prepared using Conservation officer, historic buildings and may include unintentional dictation errors.  Rockie Rams  FNP-BC, PMHNP-BC Nurse Practitioner [facility] [place]  "

## 2024-02-14 MED ORDER — CHLORDIAZEPOXIDE HCL 25 MG PO CAPS
25.0000 mg | ORAL_CAPSULE | Freq: Two times a day (BID) | ORAL | Status: DC
  Administered 2024-02-14 – 2024-02-15 (×2): 25 mg via ORAL
  Filled 2024-02-14 (×2): qty 1

## 2024-02-14 MED ORDER — HYDRALAZINE HCL 20 MG/ML IJ SOLN
5.0000 mg | Freq: Once | INTRAMUSCULAR | Status: AC
  Administered 2024-02-14: 5 mg via INTRAVENOUS
  Filled 2024-02-14: qty 1

## 2024-02-14 MED ORDER — CHLORDIAZEPOXIDE HCL 25 MG PO CAPS: 25.0000 mg | ORAL_CAPSULE | Freq: Three times a day (TID) | ORAL | Status: DC
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
## 2024-02-14 NOTE — Evaluation (Signed)
 Physical Therapy Evaluation Patient Details Name: Wesley Small MRN: [id number] DOB: 1935-06-18 Today's Date: 02/14/2024  History of Present Illness  Pt is an 89 year old male  presented to the ED with acute onset of confusion, admitted with Acute toxic encephalopathy likely from alcohol use disorder, Alcohol use disorder, Folic acid  deficiency    PMH significant   osteoarthritis, CAD, alcohol use disorder, sick sinus syndrome s/p permanent pacemaker placement, OSA, hypertension,  Clinical Impression  Pt was pleasantly confused t/o the session but willing to do some activity with plenty of cuing, encouragement and extra time.  Pt alert to himself but able to share little about PLOF or be consistent about home status...  He was slow to initiate cued acts but with tactile and/or verbal encouragement to show some effort.  He consistently struggled to maintain balance sitting EOB despite assist to get hip square and toward EOB, similarly he showed poor tolerance and balance with ambulation effort.  Pt will benefit from continued PT to address functional limitations.        If plan is discharge home, recommend the following: A little help with walking and/or transfers;A little help with bathing/dressing/bathroom;Assistance with cooking/housework;Direct supervision/assist for financial management;Assist for transportation;Help with stairs or ramp for entrance;Supervision due to cognitive status   Can travel by private vehicle   Yes    Equipment Recommendations  (TBD at rehab)  Recommendations for Other Services       Functional Status Assessment Patient has had a recent decline in their functional status and/or demonstrates limited ability to make significant improvements in function in a reasonable and predictable amount of time     Precautions / Restrictions Precautions Precautions: Fall Restrictions Weight Bearing Restrictions Per Provider Order: No      Mobility  Bed  Mobility Overal bed mobility: Needs Assistance Bed Mobility: Supine to Sit     Supine to sit: Mod assist     General bed mobility comments: frequent multi modal cues for technique, gave him ample time to try sitting up w/o assist, pt unable to organize maintaining the effort and/or too weak to do so    Transfers Overall transfer level: Needs assistance Equipment used: Rolling walker (2 wheels) Transfers: Sit to/from Stand Sit to Stand: Mod assist, Max assist, Via lift equipment           General transfer comment: heavy backward lean, needed constant assist for forward and upward weight shift.  RW, frequent multi modal cues for technique    Ambulation/Gait Ambulation/Gait assistance: Min assist, Mod assist Gait Distance (Feet): 30 Feet Assistive device: Rolling walker (2 wheels)         General Gait Details: Pt initially leaning backward, direct hands-on tactile cuing to align hips and shoulders - did eventually get weight forward in walker but poor overall awareness, tending to shift feet outside of walker during turns, forward flexed/hunched posture only briefly improved with direct assist/cuing.  Pt reports significant fatigue with the effort, HR remains stable in the 70-80s range  Stairs            Wheelchair Mobility     Tilt Bed    Modified Rankin (Stroke Patients Only)       Balance Overall balance assessment: Needs assistance Sitting-balance support: Feet supported Sitting balance-Leahy Scale: Fair Sitting balance - Comments: tending to lean back and to the R t/o the effort despite much cuing +  assist to scoot forward and square to EOB Postural control: Posterior lean Standing balance  support: Bilateral upper extremity supported, During functional activity, Reliant on assistive device for balance Standing balance-Leahy Scale: Poor Standing balance comment: eventually able to get weight forward in the walker but initially leaning backward heavily                              Pertinent Vitals/Pain Pain Assessment Pain Assessment: No/denies pain    Home Living Family/patient expects to be discharged to:: Private residence Living Arrangements: Alone Available Help at Discharge: Friend(s) Type of Home: House Home Access: Stairs to enter Entrance Stairs-Rails: Left Entrance Stairs-Number of Steps: 3 Alternate Level Stairs-Number of Steps: does not need to go upstairs Home Layout: Multi-level;Able to live on main level with bedroom/bathroom Home Equipment: Cane - single point;Rollator (4 wheels)      Prior Function Prior Level of Function : Independent/Modified Independent             Mobility Comments: recently started HHPT- but mostly amb with no AD, multiple falls in the last 6 months ADLs Comments: generally MOD I with ADL/IADL per son     Extremity/Trunk Assessment   Upper Extremity Assessment Upper Extremity Assessment: Generalized weakness    Lower Extremity Assessment Lower Extremity Assessment: Generalized weakness       Communication   Communication Communication: Impaired Factors Affecting Communication: Reduced clarity of speech    Cognition Arousal: Lethargic Behavior During Therapy: Flat affect                           PT - Cognition Comments: Pt with general confusion, struggled to maintain task follow through or complete sentences fully coherently Following commands: Impaired Following commands impaired: Follows one step commands inconsistently     Cueing Cueing Techniques: Verbal cues, Gestural cues, Tactile cues, Visual cues     General Comments General comments (skin integrity, edema, etc.): spo2 >90% on RA, HR 90s bpm post mobility    Exercises     Assessment/Plan    PT Assessment Patient needs continued PT services  PT Problem List Decreased strength;Decreased range of motion;Decreased activity tolerance;Decreased balance;Decreased mobility;Decreased safety  awareness;Decreased coordination;Decreased cognition;Decreased knowledge of use of DME       PT Treatment Interventions DME instruction;Gait training;Functional mobility training;Therapeutic activities;Therapeutic exercise;Balance training;Cognitive remediation;Patient/family education    PT Goals (Current goals can be found in the Care Plan section)  Acute Rehab PT Goals Patient Stated Goal: get stronger PT Goal Formulation: With patient Time For Goal Achievement: 02/14/24 Potential to Achieve Goals: Good    Frequency Min 2X/week     Co-evaluation               AM-PAC PT 6 Clicks Mobility  Outcome Measure Help needed turning from your back to your side while in a flat bed without using bedrails?: A Little Help needed moving from lying on your back to sitting on the side of a flat bed without using bedrails?: A Lot Help needed moving to and from a bed to a chair (including a wheelchair)?: A Lot Help needed standing up from a chair using your arms (e.g., wheelchair or bedside chair)?: A Lot Help needed to walk in hospital room?: A Lot Help needed climbing 3-5 steps with a railing? : Total 6 Click Score: 12    End of Session Equipment Utilized During Treatment: Gait belt Activity Tolerance: Patient limited by fatigue Patient left: with call bell/phone within reach;with nursing/sitter in room;in  chair Nurse Communication: Mobility status PT Visit Diagnosis: Muscle weakness (generalized) (M62.81);Difficulty in walking, not elsewhere classified (R26.2)    Time: 8474-8454 PT Time Calculation (min) (ACUTE ONLY): 20 min   Charges:   PT Evaluation $PT Eval Low Complexity: 1 Low PT Treatments $Gait Training: 8-22 mins PT General Charges $$ ACUTE PT VISIT: 1 Visit         Carmin JONELLE Deed, DPT 02/14/2024, 4:02 PM

## 2024-02-14 NOTE — TOC PASRR Note (Signed)
 RE: Wesley Small Date of Birth: 1935-08-12 Date: 02/14/2024   To Whom It May Concern:  Please be advised that the above-named patient will require a short-term nursing home stay - anticipated 30 days or less for rehabilitation and strengthening.  The plan is for return home.

## 2024-02-14 NOTE — Plan of Care (Signed)

## 2024-02-14 NOTE — Evaluation (Signed)
 Occupational Therapy Evaluation Patient Details Name: Wesley Small MRN: [id number] DOB: 1935/07/07 Today's Date: 02/14/2024   History of Present Illness   Pt is an 89 year old male  presented to the ED with acute onset of confusion, admitted with Acute toxic encephalopathy likely from alcohol use disorder, Alcohol use disorder, Folic acid  deficiency    PMH significant   osteoarthritis, CAD, alcohol use disorder, sick sinus syndrome s/p permanent pacemaker placement, OSA, hypertension,     Clinical Impressions Chart reviewed to date, pt greeted semi supine in bed with son present. Pt is lethargic, wakes with position changes. He requires frequent-step by step multi modal cues for one step directions. PTA pt is generally MOD I-I in ADL, amb with no AD, recently has started using AD and HHPT due to at least 5 falls in the last 6 months per pt son. Pt presents with deficits in strength, endurance, activity tolerance, balance, cognition, affecting safe and optimal ADL completion. See further details below. Pt will benefit from acute OT to address deficits/facilitate opimtal ADL/functional mobility engagement. Pt is left as received, all needs met. OT will follow.      If plan is discharge home, recommend the following:   A lot of help with walking and/or transfers;A lot of help with bathing/dressing/bathroom;Supervision due to cognitive status     Functional Status Assessment   Patient has had a recent decline in their functional status and demonstrates the ability to make significant improvements in function in a reasonable and predictable amount of time.     Equipment Recommendations   Other (comment) (defer to next venue of care)     Recommendations for Other Services         Precautions/Restrictions   Precautions Precautions: Fall Recall of Precautions/Restrictions: Impaired Restrictions Weight Bearing Restrictions Per Provider Order: No     Mobility Bed  Mobility Overal bed mobility: Needs Assistance Bed Mobility: Supine to Sit, Sit to Supine     Supine to sit: Mod assist, Max assist, HOB elevated, Used rails Sit to supine: HOB elevated, Used rails, Mod assist   General bed mobility comments: frequent multi modal cues for technique    Transfers Overall transfer level: Needs assistance Equipment used: Rolling walker (2 wheels) Transfers: Sit to/from Stand Sit to Stand: Mod assist, Max assist, Via lift equipment           General transfer comment: with RW, frequent multi modal cues for technique      Balance Overall balance assessment: Needs assistance Sitting-balance support: Feet supported Sitting balance-Leahy Scale: Fair Sitting balance - Comments: at least CGA sitting on edge of bed, 2 posterior LOB with MOD A to re-correct Postural control: Posterior lean Standing balance support: Bilateral upper extremity supported, During functional activity, Reliant on assistive device for balance Standing balance-Leahy Scale: Poor Standing balance comment: static standing for approx 20 seconds, reports he needs to sit due to dizziness                           ADL either performed or assessed with clinical judgement   ADL Overall ADL's : Needs assistance/impaired                     Lower Body Dressing: Maximal assistance Lower Body Dressing Details (indicate cue type and reason): donn socks     Toileting- Clothing Manipulation and Hygiene: Maximal assistance  Vision Patient Visual Report: No change from baseline Additional Comments: will continue to assess     Perception         Praxis         Pertinent Vitals/Pain Pain Assessment Pain Assessment: Faces Faces Pain Scale: Hurts a little bit Pain Location: generalized Pain Descriptors / Indicators: Grimacing     Extremity/Trunk Assessment Upper Extremity Assessment Upper Extremity Assessment: Generalized weakness;Difficult  to assess due to impaired cognition   Lower Extremity Assessment Lower Extremity Assessment: Generalized weakness;Difficult to assess due to impaired cognition       Communication Communication Communication: Impaired Factors Affecting Communication: Reduced clarity of speech   Cognition Arousal: Lethargic, Suspect due to medications (received ativan  this morning) Behavior During Therapy: Flat affect Cognition: Cognition impaired   Orientation impairments: Time, Situation Awareness: Intellectual awareness impaired, Online awareness impaired Memory impairment (select all impairments): Declarative long-term memory, Working memory, Non-declarative long-term memory, Short-term memory Attention impairment (select first level of impairment): Focused attention Executive functioning impairment (select all impairments): Initiation, Organization, Sequencing, Reasoning, Problem solving                   Following commands: Impaired Following commands impaired: Follows one step commands inconsistently     Cueing  General Comments   Cueing Techniques: Verbal cues;Gestural cues;Tactile cues;Visual cues  spo2 >90% on RA, HR 90s bpm post mobility   Exercises Other Exercises Other Exercises: edu pt/son re role of OT, role of rehab, discharge recommendations   Shoulder Instructions      Home Living Family/patient expects to be discharged to:: Private residence Living Arrangements: Alone Available Help at Discharge: Friend(s) Type of Home: House Home Access: Stairs to enter [employer] of Steps: 3 Entrance Stairs-Rails: Left Home Layout: Multi-level;Able to live on main level with bedroom/bathroom Alternate Level Stairs-Number of Steps: does not really go upstairs   Bathroom Shower/Tub: Walk-in shower         Home Equipment: [employer] (2 wheels);Cane - single point          Prior Functioning/Environment Prior Level of Function : Independent/Modified  Independent             Mobility Comments: recently started HHPT- but mostly amb with no AD, multiple falls in the last 6 months, son reports at least 5 ADLs Comments: generally MOD I with ADL/IADL per son    OT Problem List: Decreased strength;Impaired balance (sitting and/or standing);Decreased activity tolerance;Decreased safety awareness;Decreased cognition;Decreased coordination;Decreased knowledge of use of DME or AE;Decreased knowledge of precautions   OT Treatment/Interventions: Self-care/ADL training;Therapeutic exercise;Energy conservation;DME and/or AE instruction;Patient/family education;Cognitive remediation/compensation;Therapeutic activities;Balance training      OT Goals(Current goals can be found in the care plan section)   Acute Rehab OT Goals Patient Stated Goal: rehab OT Goal Formulation: With patient/family Time For Goal Achievement: 02/28/24 Potential to Achieve Goals: Good ADL Goals Pt Will Perform Grooming: with modified independence;standing;sitting Pt Will Perform Lower Body Dressing: with min assist;sitting/lateral leans;sit to/from stand Pt Will Transfer to Toilet: with contact guard assist;stand pivot transfer Pt Will Perform Toileting - Clothing Manipulation and hygiene: with min assist;sitting/lateral leans   OT Frequency:  Min 2X/week    Co-evaluation              AM-PAC OT 6 Clicks Daily Activity     Outcome Measure Help from another person eating meals?: A Lot Help from another person taking care of personal grooming?: A Lot Help from another person toileting, which includes using toliet, bedpan,  or urinal?: A Lot Help from another person bathing (including washing, rinsing, drying)?: A Lot Help from another person to put on and taking off regular upper body clothing?: A Lot Help from another person to put on and taking off regular lower body clothing?: A Lot 6 Click Score: 12   End of Session Equipment Utilized During Treatment:  Rolling walker (2 wheels);Gait belt Nurse Communication: Mobility status  Activity Tolerance: Patient limited by lethargy Patient left: in bed;with call bell/phone within reach;with bed alarm set;with nursing/sitter in room;with family/visitor present  OT Visit Diagnosis: Other abnormalities of gait and mobility (R26.89);Muscle weakness (generalized) (M62.81);Other symptoms and signs involving cognitive function                Time: 1131-1148 OT Time Calculation (min): 17 min Charges:  OT General Charges $OT Visit: 1 Visit OT Evaluation $OT Eval Moderate Complexity: 1 Mod  Therisa Sheffield, OTD OTR/L  02/14/24, 12:58 PM

## 2024-02-14 NOTE — TOC Progression Note (Addendum)
 Transition of Care [facility]) - Progression Note    Patient Details  Name: Wesley Small MRN: [id number] Date of Birth: 1935/05/13  Transition of Care [facility] CM/SW Contact  Lauraine JAYSON Carpen, LCSW Phone Number: 02/14/2024, 10:59 AM  Clinical Narrative:  If SNF is recommended, son expressed interested in [facility] and [facility]. CSW will call him back once therapy recommendations are in. Discussed ALF's. He said that [facility] has an opening.  4:29 pm: Left voicemail for [facility] admissions coordinator asking her to review referral. [facility] will review in the morning. CSW also sent referral to the other facilities in [place] and [facility]. CSW updated son. PASARR under manual review. Uploaded clinicals.  Expected Discharge Plan: Assisted Living Barriers to Discharge: Continued Medical Work up               Expected Discharge Plan and Services     Post Acute Care Choice:  (TBD) Living arrangements for the past 2 months: Single Family Home                                       Social Drivers of Health (SDOH) Interventions SDOH Screenings   Food Insecurity: No Food Insecurity (12/27/2023)  Housing: Low Risk (12/27/2023)  Transportation Needs: No Transportation Needs (12/27/2023)  Utilities: Not At Risk (12/27/2023)  Alcohol Screen: Low Risk (12/21/2022)  Depression (PHQ2-9): Low Risk (01/22/2024)  Financial Resource Strain: Low Risk (12/21/2022)  Physical Activity: Inactive (12/27/2023)  Social Connections: Moderately Isolated (12/27/2023)  Stress: No Stress Concern Present (12/27/2023)  Tobacco Use: Medium Risk (02/11/2024)  Health Literacy: Adequate Health Literacy (12/27/2023)    Readmission Risk Interventions     No data to display

## 2024-02-14 NOTE — NC FL2 (Signed)
 " [place]  MEDICAID FL2 LEVEL OF CARE FORM     IDENTIFICATION  Patient Name: Wesley Small Birthdate: [birth year] Sex: male Admission Date (Current Location): 02/11/2024  [facility] and [place] Number:  Chiropodist and Address:  [facility], [address], [place], [place] [address]      Provider Number: [id number]  Attending Physician Name and Address:  Franchot Novel, MD  Relative Name and Phone Number:       Current Level of Care: Hospital Recommended Level of Care: Skilled Nursing Facility Prior Approval Number:    Date Approved/Denied:   PASRR Number: Manual review  Discharge Plan: SNF    Current Diagnoses: Patient Active Problem List   Diagnosis Date Noted   Acute encephalopathy 02/11/2024   Alcohol abuse 02/11/2024   Hypomagnesemia 02/11/2024   Essential hypertension 02/11/2024   Failure to thrive in adult 12/30/2022   Unintentional weight loss 12/01/2022   Falls frequently 12/01/2022   Unable to ambulate 12/01/2022   Deep vein thrombosis (DVT) of popliteal vein of right lower extremity (HCC) 08/25/2022   Depression, recurrent 08/09/2022   Unilateral edema of lower extremity 08/09/2022   Anxiety 07/19/2022   Elevated serum creatinine 07/19/2022   Chronic fatigue 06/27/2022   Tremor 10/28/2021   Dizziness 10/28/2021   Osteoarthritis of knees, bilateral 05/08/2021   Prostate cancer (HCC) 10/10/2019   Osteoarthritis of knee 08/02/2016   Arthritis 10/29/2014   Insomnia, persistent 10/29/2014   Hypercholesterolemia 10/29/2014   Artificial cardiac pacemaker 10/29/2014   Sick sinus syndrome (HCC) 11/11/2013   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   Acid reflux 05/22/2009   Essential (primary) hypertension 01/13/2009    Orientation RESPIRATION BLADDER Height & Weight     Self, Place  Normal Continent Weight: 123 lb 14.4 oz (56.2 kg) Height:  5' 7 (170.2 cm)  BEHAVIORAL SYMPTOMS/MOOD NEUROLOGICAL BOWEL NUTRITION  STATUS  Other (Comment) (Cooperative, calm.)  (None) Incontinent Diet (DYS 3)  AMBULATORY STATUS COMMUNICATION OF NEEDS Skin   Limited Assist Verbally Other (Comment) (Erythema/redness.)                       Personal Care Assistance Level of Assistance  Bathing, Feeding, Dressing Bathing Assistance: Maximum assistance Feeding assistance: Limited assistance Dressing Assistance: Maximum assistance     Functional Limitations Info  Sight, Hearing, Speech Sight Info: Adequate Hearing Info: Adequate Speech Info: Adequate    SPECIAL CARE FACTORS FREQUENCY  PT (By licensed PT), OT (By licensed OT)     PT Frequency: 5 x week OT Frequency: 5 x week            Contractures Contractures Info: Not present    Additional Factors Info  Code Status, Allergies Code Status Info: Full code Allergies Info: Penicillins           Current Medications (02/14/2024):  This is the current hospital active medication list Current Facility-Administered Medications  Medication Dose Route Frequency Provider Last Rate Last Admin   acetaminophen  (TYLENOL ) tablet 650 mg  650 mg Oral Q6H PRN Mansy, Jan A, MD       Or   acetaminophen  (TYLENOL ) suppository 650 mg  650 mg Rectal Q6H PRN Mansy, Jan A, MD       chlordiazePOXIDE  (LIBRIUM ) capsule 25 mg  25 mg Oral QID Jens Durand, MD   25 mg at 02/14/24 1312   enoxaparin  (LOVENOX ) injection 40 mg  40 mg Subcutaneous Q24H Mansy, Jan A, MD   40 mg at 02/14/24  1027   folic acid  (FOLVITE ) tablet 1 mg  1 mg Oral Daily Mansy, Jan A, MD   1 mg at 02/14/24 1027   LORazepam  (ATIVAN ) tablet 1-4 mg  1-4 mg Oral Q1H PRN Mansy, Jan A, MD   1 mg at 02/12/24 1746   Or   LORazepam  (ATIVAN ) injection 1-4 mg  1-4 mg Intravenous Q1H PRN Mansy, Jan A, MD   1 mg at 02/14/24 0636   magnesium  hydroxide (MILK OF MAGNESIA) suspension 30 mL  30 mL Oral Daily PRN Mansy, Jan A, MD       metoprolol  succinate (TOPROL -XL) 24 hr tablet 25 mg  25 mg Oral Daily Mansy, Jan A, MD    25 mg at 02/14/24 1028   multivitamin with minerals tablet 1 tablet  1 tablet Oral Daily Mansy, Jan A, MD   1 tablet at 02/14/24 1028   ondansetron  (ZOFRAN ) tablet 4 mg  4 mg Oral Q6H PRN Mansy, Jan A, MD       Or   ondansetron  (ZOFRAN ) injection 4 mg  4 mg Intravenous Q6H PRN Mansy, Jan A, MD       sertraline  (ZOLOFT ) tablet 50 mg  50 mg Oral Daily Mansy, Jan A, MD   50 mg at 02/14/24 1028   sodium chloride  flush (NS) 0.9 % injection 3 mL  3 mL Intravenous Once Jessup, Charles, MD       thiamine  (VITAMIN B1) tablet 100 mg  100 mg Oral Daily Mansy, Jan A, MD   100 mg at 02/13/24 0900   Or   thiamine  (VITAMIN B1) injection 100 mg  100 mg Intravenous Daily Mansy, Jan A, MD   100 mg at 02/14/24 1026   traMADol  (ULTRAM ) tablet 50 mg  50 mg Oral Q12H PRN Mansy, Jan A, MD       zolpidem  (AMBIEN ) tablet 5 mg  5 mg Oral QHS PRN Mansy, Madison LABOR, MD         Discharge Medications: Please see discharge summary for a list of discharge medications.  Relevant Imaging Results:  Relevant Lab Results:   Additional Information SS#: [id number]  Wesley JAYSON Carpen, LCSW     "

## 2024-02-14 NOTE — Plan of Care (Signed)
  Problem: Clinical Measurements: Goal: Ability to maintain clinical measurements within normal limits will improve Outcome: Progressing   Problem: Activity: Goal: Risk for activity intolerance will decrease Outcome: Progressing   Problem: Pain Managment: Goal: General experience of comfort will improve and/or be controlled Outcome: Progressing   Problem: Safety: Goal: Ability to remain free from injury will improve Outcome: Progressing   Problem: Skin Integrity: Goal: Risk for impaired skin integrity will decrease Outcome: Progressing

## 2024-02-15 ENCOUNTER — Telehealth: Payer: Self-pay

## 2024-02-15 LAB — BASIC METABOLIC PANEL WITH GFR
Anion gap: 12 (ref 5–15)
BUN: 14 mg/dL (ref 8–23)
CO2: 23 mmol/L (ref 22–32)
Calcium: 8.7 mg/dL — ABNORMAL LOW (ref 8.9–10.3)
Chloride: 107 mmol/L (ref 98–111)
Creatinine, Ser: 1.03 mg/dL (ref 0.61–1.24)
GFR, Estimated: >60 mL/min
Glucose, Bld: 103 mg/dL — ABNORMAL HIGH (ref 70–99)
Potassium: 3.5 mmol/L (ref 3.5–5.1)
Sodium: 142 mmol/L (ref 135–145)

## 2024-02-15 LAB — PHOSPHORUS: Phosphorus: 3.1 mg/dL (ref 2.5–4.6)

## 2024-02-15 LAB — CBC WITH DIFFERENTIAL/PLATELET
Abs Immature Granulocytes: 0.06 10*3/uL (ref 0.00–0.07)
Basophils Absolute: 0 10*3/uL (ref 0.0–0.1)
Basophils Relative: 0 %
Eosinophils Absolute: 0.4 10*3/uL (ref 0.0–0.5)
Eosinophils Relative: 4 %
HCT: 39.2 % (ref 39.0–52.0)
Hemoglobin: 13.6 g/dL (ref 13.0–17.0)
Immature Granulocytes: 1 %
Lymphocytes Relative: 16 %
Lymphs Abs: 2 10*3/uL (ref 0.7–4.0)
MCH: 34.5 pg — ABNORMAL HIGH (ref 26.0–34.0)
MCHC: 34.7 g/dL (ref 30.0–36.0)
MCV: 99.5 fL (ref 80.0–100.0)
Monocytes Absolute: 1.2 10*3/uL — ABNORMAL HIGH (ref 0.1–1.0)
Monocytes Relative: 10 %
Neutro Abs: 8.7 10*3/uL — ABNORMAL HIGH (ref 1.7–7.7)
Neutrophils Relative %: 69 %
Platelets: 321 10*3/uL (ref 150–400)
RBC: 3.94 MIL/uL — ABNORMAL LOW (ref 4.22–5.81)
RDW: 12 % (ref 11.5–15.5)
WBC: 12.4 10*3/uL — ABNORMAL HIGH (ref 4.0–10.5)
nRBC: 0 % (ref 0.0–0.2)

## 2024-02-15 LAB — VITAMIN B1: Vitamin B1 (Thiamine): 271.3 nmol/L — ABNORMAL HIGH (ref 66.5–200.0)

## 2024-02-15 LAB — MAGNESIUM: Magnesium: 1.8 mg/dL (ref 1.7–2.4)

## 2024-02-15 MED ORDER — QUETIAPINE FUMARATE 25 MG PO TABS
12.5000 mg | ORAL_TABLET | Freq: Every day | ORAL | Status: AC
  Administered 2024-02-15 – 2024-02-16 (×2): 12.5 mg via ORAL
  Filled 2024-02-15 (×2): qty 1

## 2024-02-15 MED ORDER — NALTREXONE HCL 50 MG PO TABS
50.0000 mg | ORAL_TABLET | Freq: Every day | ORAL | Status: AC
  Administered 2024-02-15 – 2024-02-16 (×2): 50 mg via ORAL
  Filled 2024-02-15 (×2): qty 1

## 2024-02-15 MED ORDER — HYDRALAZINE HCL 20 MG/ML IJ SOLN
5.0000 mg | Freq: Once | INTRAMUSCULAR | Status: AC
  Administered 2024-02-15: 5 mg via INTRAVENOUS
  Filled 2024-02-15: qty 1

## 2024-02-15 NOTE — Plan of Care (Signed)
 VSS. RA. Family at bedside this shift. Working on SNF placement.  Problem: Education: Goal: Knowledge of General Education information will improve Description: Including pain rating scale, medication(s)/side effects and non-pharmacologic comfort measures Outcome: Progressing   Problem: Health Behavior/Discharge Planning: Goal: Ability to manage health-related needs will improve Outcome: Progressing   Problem: Clinical Measurements: Goal: Ability to maintain clinical measurements within normal limits will improve Outcome: Progressing Goal: Will remain free from infection Outcome: Progressing Goal: Diagnostic test results will improve Outcome: Progressing Goal: Respiratory complications will improve Outcome: Progressing Goal: Cardiovascular complication will be avoided Outcome: Progressing   Problem: Activity: Goal: Risk for activity intolerance will decrease Outcome: Progressing   Problem: Nutrition: Goal: Adequate nutrition will be maintained Outcome: Progressing   Problem: Coping: Goal: Level of anxiety will decrease Outcome: Progressing   Problem: Elimination: Goal: Will not experience complications related to bowel motility Outcome: Progressing Goal: Will not experience complications related to urinary retention Outcome: Progressing   Problem: Pain Managment: Goal: General experience of comfort will improve and/or be controlled Outcome: Progressing   Problem: Safety: Goal: Ability to remain free from injury will improve Outcome: Progressing   Problem: Skin Integrity: Goal: Risk for impaired skin integrity will decrease Outcome: Progressing

## 2024-02-15 NOTE — Telephone Encounter (Signed)
 Copied from CRM [id number]. Topic: General - Other >> Feb 14, 2024  4:53 PM Nathanel BROCKS wrote: Reason for CRM: Please call physical therapy and cancel appt he is in the hospital. Will resume when he is able.

## 2024-02-15 NOTE — Care Management Important Message (Signed)
 Important Message  Patient Details  Name: Wesley Small MRN: [id number] Date of Birth: Jul 05, 1935   Important Message Given:  Yes - Medicare IM     Brigida Scotti 02/15/2024, 11:44 AM

## 2024-02-15 NOTE — TOC Progression Note (Addendum)
 Transition of Care [facility]) - Progression Note    Patient Details  Name: Wesley Small MRN: [id number] Date of Birth: Apr 01, 1935  Transition of [facility]) CM/SW Contact  Lauraine JAYSON Carpen, LCSW Phone Number: 02/15/2024, 9:40 AM  Clinical Narrative:   PASARR still pending. [facility] and [facility] are unable to offer a bed. Son is aware. He was unsure if it would help, but patient has been on the ILF vs ALF list at [facility] for several years. Left message for [facility] admissions coordinator to notify. CSW will email bed offer list to son around lunchtime to give more facilities time to respond.  10:08 am: [facility] is still unable to offer a bed.  12:24 pm: PASARR under level 2 review. CSW emailed bed offers to son.  Expected Discharge Plan: Assisted Living Barriers to Discharge: Continued Medical Work up               Expected Discharge Plan and Services     Post Acute Care Choice:  (TBD) Living arrangements for the past 2 months: Single Family Home                                       Social Drivers of Health (SDOH) Interventions SDOH Screenings   Food Insecurity: No Food Insecurity (12/27/2023)  Housing: Low Risk (12/27/2023)  Transportation Needs: No Transportation Needs (12/27/2023)  Utilities: Not At Risk (12/27/2023)  Alcohol Screen: Low Risk (12/21/2022)  Depression (PHQ2-9): Low Risk (01/22/2024)  Financial Resource Strain: Low Risk (12/21/2022)  Physical Activity: Inactive (12/27/2023)  Social Connections: Moderately Isolated (12/27/2023)  Stress: No Stress Concern Present (12/27/2023)  Tobacco Use: Medium Risk (02/11/2024)  Health Literacy: Adequate Health Literacy (12/27/2023)    Readmission Risk Interventions     No data to display

## 2024-02-15 NOTE — Progress Notes (Signed)
 PT Cancellation Note  Patient Details Name: MAXSON ODDO MRN: [id number] DOB: 1935-11-01   Cancelled Treatment:    Reason Eval/Treat Not Completed: Other (comment) On arrival pt in bed in NAD, confusedly messing about with his telemetry box. He reports being exhausted and not wanting to get up.  PT encouraged some activity but he persists on states Let's not today.    Carmin JONELLE Deed, DPT 02/15/2024, 5:00 PM

## 2024-02-15 NOTE — Telephone Encounter (Signed)
 PT Telephone encounter reviewed.
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
## 2024-02-16 LAB — CBC
HCT: 41.2 % (ref 39.0–52.0)
Hemoglobin: 13.8 g/dL (ref 13.0–17.0)
MCH: 33.7 pg (ref 26.0–34.0)
MCHC: 33.5 g/dL (ref 30.0–36.0)
MCV: 100.7 fL — ABNORMAL HIGH (ref 80.0–100.0)
Platelets: 321 10*3/uL (ref 150–400)
RBC: 4.09 MIL/uL — ABNORMAL LOW (ref 4.22–5.81)
RDW: 12 % (ref 11.5–15.5)
WBC: 10.1 10*3/uL (ref 4.0–10.5)
nRBC: 0 % (ref 0.0–0.2)

## 2024-02-16 LAB — BASIC METABOLIC PANEL WITH GFR
Anion gap: 10 (ref 5–15)
BUN: 18 mg/dL (ref 8–23)
CO2: 27 mmol/L (ref 22–32)
Calcium: 9 mg/dL (ref 8.9–10.3)
Chloride: 109 mmol/L (ref 98–111)
Creatinine, Ser: 1.24 mg/dL (ref 0.61–1.24)
GFR, Estimated: 57 mL/min — ABNORMAL LOW
Glucose, Bld: 101 mg/dL — ABNORMAL HIGH (ref 70–99)
Potassium: 3.4 mmol/L — ABNORMAL LOW (ref 3.5–5.1)
Sodium: 145 mmol/L (ref 135–145)

## 2024-02-16 MED ORDER — ORAL CARE MOUTH RINSE: 15.0000 mL | OROMUCOSAL | Status: AC | PRN

## 2024-02-16 MED ORDER — POTASSIUM CHLORIDE 20 MEQ PO PACK
40.0000 meq | PACK | Freq: Once | ORAL | Status: AC
  Administered 2024-02-16: 40 meq via ORAL
  Filled 2024-02-16: qty 2

## 2024-02-16 MED ORDER — MAGNESIUM SULFATE 2 GM/50ML IV SOLN
2.0000 g | Freq: Once | INTRAVENOUS | Status: AC
  Administered 2024-02-16: 2 g via INTRAVENOUS
  Filled 2024-02-16: qty 50

## 2024-02-16 MED ORDER — THIAMINE HCL 100 MG/ML IJ SOLN
500.0000 mg | Freq: Every day | INTRAVENOUS | Status: AC
  Administered 2024-02-16: 500 mg via INTRAVENOUS
  Filled 2024-02-16: qty 5

## 2024-02-16 MED ORDER — ENSURE PLUS HIGH PROTEIN PO LIQD: 237.0000 mL | Freq: Two times a day (BID) | ORAL | Status: AC

## 2024-02-16 NOTE — Plan of Care (Signed)
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
## 2024-02-16 NOTE — Plan of Care (Signed)

## 2024-02-16 NOTE — Progress Notes (Signed)
 PT Cancellation Note  Patient Details Name: Wesley Small MRN: [id number] DOB: 1935-11-30   Cancelled Treatment:     Therapist in this pm, pt resting in bed, remains pleasantly confused. Politely declined PT session. Will re-attempt next available date/time per POC.   Darice JAYSON Bohr 02/16/2024, 3:31 PM

## 2024-02-16 NOTE — TOC Progression Note (Addendum)
 Transition of Care [facility]) - Progression Note    Patient Details  Name: Wesley Small MRN: [id number] Date of Birth: [birth year]  [facility]) CM/SW Contact  Lauraine JAYSON Carpen, LCSW Phone Number: 02/16/2024, 10:08 AM  Clinical Narrative:   PASARR obtained: [phone] H. Emailed son to let him know there are no new bed offers since yesterday.  12:16 pm: Son is interested in [address], [address], [facility], [facility], and [facility]. [facility] and [facility] are reviewing. [facility] [address] and [facility] declined. [facility] doesn't have any beds right now.  2:21 pm: [facility] is unable to offer a bed. CSW emailed son to notify.  2:33 pm: Still waiting on response from [facility]. Son's preference out of those that have already offered a bed is [facility] in [place].  3:35 pm: Son has accepted bed offer from [facility]. They can accept him tomorrow as long as CIWA score is 0. MD is aware.  Expected Discharge Plan: Assisted Living Barriers to Discharge: Continued Medical Work up               Expected Discharge Plan and Services     Post Acute Care Choice:  (TBD) Living arrangements for the past 2 months: Single Family Home                                       Social Drivers of Health (SDOH) Interventions SDOH Screenings   Food Insecurity: No Food Insecurity (12/27/2023)  Housing: Low Risk (12/27/2023)  Transportation Needs: No Transportation Needs (12/27/2023)  Utilities: Not At Risk (12/27/2023)  Alcohol Screen: Low Risk (12/21/2022)  Depression (PHQ2-9): Low Risk (01/22/2024)  Financial Resource Strain: Low Risk (12/21/2022)  Physical Activity: Inactive (12/27/2023)  Social Connections: Moderately Isolated (12/27/2023)  Stress: No Stress Concern Present (12/27/2023)  Tobacco Use: Medium Risk (02/11/2024)  Health Literacy: Adequate Health Literacy (12/27/2023)     Readmission Risk Interventions     No data to display

## 2024-02-20 ENCOUNTER — Ambulatory Visit: Admitting: Family Medicine

## 2024-03-18 ENCOUNTER — Ambulatory Visit: Admitting: Family Medicine

## 2025-01-01 ENCOUNTER — Ambulatory Visit
